# Patient Record
Sex: Male | Born: 1955 | Race: Black or African American | Hispanic: No | Marital: Single | Smoking: Never smoker
Health system: Southern US, Community
[De-identification: ages and names within clinical notes are randomized; demographics above are authoritative.]

## PROBLEM LIST (undated history)

## (undated) DIAGNOSIS — I255 Ischemic cardiomyopathy: Secondary | ICD-10-CM

## (undated) DIAGNOSIS — K635 Polyp of colon: Secondary | ICD-10-CM

## (undated) DIAGNOSIS — K621 Rectal polyp: Secondary | ICD-10-CM

## (undated) DIAGNOSIS — I10 Essential (primary) hypertension: Secondary | ICD-10-CM

## (undated) DIAGNOSIS — I2510 Atherosclerotic heart disease of native coronary artery without angina pectoris: Secondary | ICD-10-CM

## (undated) HISTORY — PX: OTHER SURGICAL HISTORY: SHX169

## (undated) HISTORY — DX: Polyp of colon: K63.5

## (undated) HISTORY — DX: Rectal polyp: K62.1

## (undated) HISTORY — DX: Essential (primary) hypertension: I10

---

## 1999-02-08 ENCOUNTER — Ambulatory Visit (HOSPITAL_COMMUNITY): Admission: RE | Admit: 1999-02-08 | Discharge: 1999-02-08 | Payer: Self-pay | Admitting: Family Medicine

## 2006-01-01 ENCOUNTER — Ambulatory Visit: Payer: Self-pay | Admitting: Cardiology

## 2006-01-03 ENCOUNTER — Ambulatory Visit: Payer: Self-pay

## 2006-09-29 ENCOUNTER — Emergency Department (HOSPITAL_COMMUNITY): Admission: EM | Admit: 2006-09-29 | Discharge: 2006-09-29 | Payer: Self-pay | Admitting: Emergency Medicine

## 2007-01-21 ENCOUNTER — Ambulatory Visit: Payer: Self-pay | Admitting: Internal Medicine

## 2007-01-21 LAB — CONVERTED CEMR LAB
ALT: 23 units/L (ref 0–40)
AST: 22 units/L (ref 0–37)
Alkaline Phosphatase: 51 units/L (ref 39–117)
Basophils Absolute: 0 10*3/uL (ref 0.0–0.1)
Basophils Relative: 0.7 % (ref 0.0–1.0)
Bilirubin, Direct: 0.1 mg/dL (ref 0.0–0.3)
Calcium: 9.2 mg/dL (ref 8.4–10.5)
Creatinine, Ser: 1.1 mg/dL (ref 0.4–1.5)
Eosinophils Absolute: 0 10*3/uL (ref 0.0–0.6)
Eosinophils Relative: 1 % (ref 0.0–5.0)
GFR calc Af Amer: 90 mL/min
Glucose, Bld: 87 mg/dL (ref 70–99)
HCT: 42.3 % (ref 39.0–52.0)
Hemoglobin: 14.5 g/dL (ref 13.0–17.0)
MCHC: 34.4 g/dL (ref 30.0–36.0)
Neutrophils Relative %: 45.6 % (ref 43.0–77.0)
Platelets: 200 10*3/uL (ref 150–400)
Pro B Natriuretic peptide (BNP): 29 pg/mL (ref 0.0–100.0)
RDW: 12.3 % (ref 11.5–14.6)
Total Bilirubin: 1 mg/dL (ref 0.3–1.2)
WBC: 4.5 10*3/uL (ref 4.5–10.5)

## 2007-01-24 ENCOUNTER — Ambulatory Visit (HOSPITAL_COMMUNITY): Admission: RE | Admit: 2007-01-24 | Discharge: 2007-01-24 | Payer: Self-pay | Admitting: Internal Medicine

## 2007-04-07 ENCOUNTER — Ambulatory Visit: Payer: Self-pay | Admitting: Internal Medicine

## 2009-08-18 ENCOUNTER — Ambulatory Visit: Payer: Self-pay | Admitting: Family Medicine

## 2009-09-15 LAB — HM COLONOSCOPY

## 2011-01-30 NOTE — Assessment & Plan Note (Signed)
[facility]                             PULMONARY OFFICE NOTE   NAME:Jorge Fisher, Jorge Fisher                       MRN:          [id number]  DATE:01/21/2007                            DOB:          [birth year]    CHIEF COMPLAINT:  Dyspnea.   HISTORY:  A 55 year old black male with abrupt onset in January of  dyspnea after walking in from cutting wood at home.  Since that time he  has been short of breath on a predictable basis, can only walk maybe 4-6  minutes before he gives out.  He has had slight cough associated with  the dyspnea and thinks he may be a little bit worse if he walks outside  versus inside.  He denies any exertional chest pain, orthopnea,  paroxysmal nocturnal dyspnea or leg swelling or significant variability  with weather changes or overt sinus or reflux symptoms.  He has already  been treated with a inhaler and Prednisone, which he said helped a  little bit.   PAST MEDICAL HISTORY:  Significant for hypertension, but not he is not  on ACE inhibitors or on any medications at present for this.   ALLERGIES:  None known.   SOCIAL HISTORY:  He never smoked.  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   FAMILY HISTORY:  Significant for the absence of HPI, asthma, review of  systems or any respiratory diseases, positive for heart disease in his  father.   REVIEW OF SYSTEMS:  Taken in detail and negative except for as outlined  above.   PHYSICAL EXAMINATION:  This is a stoic ambulatory black male with a bit  of a unusual affect and attitude.  He has stable vital signs.  HEENT:  Unremarkable, pharynx clear.  NECK:  Supple without cervical adenopathy tenderness, trachea is  midline, no thyromegaly.  LUNG FIELDS:  Perfectly clear bilaterally to auscultation and  percussion.  HEART:  Regular rhythm without murmur, gallop or rub.  ABDOMEN:  Soft, benign.  EXTREMITIES:  Warm without calf  tenderness, cyanosis, clubbing or edema.   Chest x-ray was reviewed from January 14 and indicated mild left basal  atelectasis.  CBC was normal, sed rate was 12, BNP was normal, TSH was  normal, bicarb level was 32.  Pulmonary function tests revealed a  classic expiratory truncation that was a variable and only in the effort  defend part of his flow volume.   IMPRESSION:  Unexplained dyspnea with exertion that improved somewhat  after Albuterol and prednisone, but not in a convincing or sustained  fashion and now is associated with truncation only in the expiratory  portion of the flow volume loop.  This is consistent with either vocal  cord dysfunction or a functional illness.  Note that we could not  reproduce dyspnea here in the office.   The major differential here, however is still occult asthma and I have  recommended that he undergo a Methacholine challenge testing and perhaps  a CP ST to complete the work up if the Methacholine  challenge test is  negative.  I see no evidence of thromboembolic disease, interstitial  lung disease or definite asthma by evaluation today.     Charlaine Dalton. Sherene Sires, MD, [facility]  Electronically Signed    MBW/MedQ  DD: 01/21/2007  DT: 01/22/2007  Job #: [id number]

## 2011-01-30 NOTE — Assessment & Plan Note (Signed)
[facility]                             PULMONARY OFFICE NOTE   NAME:Jorge Fisher, Jorge Fisher                       MRN:          [id number]  DATE:04/07/2007                            DOB:          Jul 13, 1956    HISTORY:  A 55 year old black male with abrupt onset of dyspnea in  January of 2008 with a normal workup to date except for classic  expiratory truncation of the expiratory loop that appeared consistent  with local cord dysfunction.  He underwent a negative methacholine  challenge test on May 9 (because he reported some benefit from taking  prednisone and albuterol) and returns today stating he is about the  same.  By that means he is not shortness of breath but says he has not  really been physically very active yet.  Note that all the remainder of  his studies including chest x-ray, BNP, sed rate, CBC, and chemistry  profile were all normal.   The patient denies any pleuritic or exertional chest pain, dyspnea with  activity (although note he has limited his activity considerably),  orthopnea, PND or leg swelling, fevers, chills, sweats or unintended  weight loss or overt reflux symptoms.   PHYSICAL EXAMINATION:  He is a somber but not overtly depressed  ambulatory white male in no acute distress.  He is afebrile, normal  vital signs.  HEENT:  Unremarkable, oropharynx clear.  LUNGS:  Fields perfectly clear bilaterally to auscultation and  percussion.  There is trace pseudo-wheeze.  Regular rhythm without murmur, gallop, rub.  ABDOMEN:  Soft, benign.  EXTREMITIES:  Warm without calf tenderness, cyanosis, clubbing, edema.   IMPRESSION:  Vocal cord dysfunction is the most likely diagnosis.  Whether it is related to reflux or not remains to be seen.  I did  recommend a diet that I reviewed with him in detail and also Prilosec  over the counter if the symptoms remain.  The other option is to proceed  with a CPST but before we do this I need him to  understand that we will  not be able to get enough information to help him unless he is able to  exercise at a level where he is short of breath but not out of breath 30  minutes daily and explained reconditioning to him in detail in writing.  He is to call to schedule a CPST if he has not noticed benefit of  regular exercise after 2 weeks.     Charlaine Dalton. Sherene Sires, MD, [facility]  Electronically Signed    MBW/MedQ  DD: 04/07/2007  DT: 04/08/2007  Job #: [id number]   cc:   Sharlot Gowda, M.D.

## 2011-07-16 ENCOUNTER — Encounter: Payer: Self-pay | Admitting: Family Medicine

## 2012-02-07 ENCOUNTER — Inpatient Hospital Stay (HOSPITAL_COMMUNITY)
Admission: EM | Admit: 2012-02-07 | Discharge: 2012-02-13 | DRG: 247 | Disposition: A | Discharge status: Home / Self Care | Payer: 59 | Attending: Cardiovascular Disease | Admitting: Cardiovascular Disease

## 2012-02-07 ENCOUNTER — Encounter (HOSPITAL_COMMUNITY): Payer: Self-pay | Admitting: *Deleted

## 2012-02-07 DIAGNOSIS — E876 Hypokalemia: Secondary | ICD-10-CM | POA: Diagnosis present

## 2012-02-07 DIAGNOSIS — I2589 Other forms of chronic ischemic heart disease: Secondary | ICD-10-CM | POA: Diagnosis present

## 2012-02-07 DIAGNOSIS — I213 ST elevation (STEMI) myocardial infarction of unspecified site: Secondary | ICD-10-CM | POA: Diagnosis present

## 2012-02-07 DIAGNOSIS — R079 Chest pain, unspecified: Secondary | ICD-10-CM

## 2012-02-07 DIAGNOSIS — I5021 Acute systolic (congestive) heart failure: Secondary | ICD-10-CM

## 2012-02-07 DIAGNOSIS — I2109 ST elevation (STEMI) myocardial infarction involving other coronary artery of anterior wall: Principal | ICD-10-CM | POA: Diagnosis present

## 2012-02-07 DIAGNOSIS — Z955 Presence of coronary angioplasty implant and graft: Secondary | ICD-10-CM

## 2012-02-07 DIAGNOSIS — I255 Ischemic cardiomyopathy: Secondary | ICD-10-CM | POA: Diagnosis present

## 2012-02-07 DIAGNOSIS — I2582 Chronic total occlusion of coronary artery: Secondary | ICD-10-CM | POA: Diagnosis present

## 2012-02-07 DIAGNOSIS — I10 Essential (primary) hypertension: Secondary | ICD-10-CM | POA: Diagnosis present

## 2012-02-07 DIAGNOSIS — K649 Unspecified hemorrhoids: Secondary | ICD-10-CM | POA: Diagnosis present

## 2012-02-07 DIAGNOSIS — I252 Old myocardial infarction: Secondary | ICD-10-CM | POA: Diagnosis present

## 2012-02-07 DIAGNOSIS — D128 Benign neoplasm of rectum: Secondary | ICD-10-CM | POA: Diagnosis present

## 2012-02-07 DIAGNOSIS — I2510 Atherosclerotic heart disease of native coronary artery without angina pectoris: Secondary | ICD-10-CM | POA: Diagnosis present

## 2012-02-07 HISTORY — DX: Atherosclerotic heart disease of native coronary artery without angina pectoris: I25.10

## 2012-02-07 HISTORY — DX: Ischemic cardiomyopathy: I25.5

## 2012-02-07 MED ORDER — HEPARIN SODIUM (PORCINE) 5000 UNIT/ML IJ SOLN
INTRAMUSCULAR | Status: AC
  Filled 2012-02-07: qty 1

## 2012-02-07 NOTE — ED Notes (Signed)
Pt started having pain around 9pm, 120/81 pressure by ems initally. 1 nitro given enroute with 4 baby asa.  Rates pain 8/10 at this time. Cardiologist at bedside on arrival

## 2012-02-08 ENCOUNTER — Ambulatory Visit (HOSPITAL_COMMUNITY): Admit: 2012-02-08 | Payer: Self-pay | Admitting: Cardiovascular Disease

## 2012-02-08 ENCOUNTER — Encounter (HOSPITAL_COMMUNITY): Payer: Self-pay | Admitting: Internal Medicine

## 2012-02-08 ENCOUNTER — Inpatient Hospital Stay (HOSPITAL_COMMUNITY): Payer: 59

## 2012-02-08 ENCOUNTER — Encounter (HOSPITAL_COMMUNITY)
Admission: EM | Disposition: A | Discharge status: Home / Self Care | Payer: Self-pay | Source: Home / Self Care | Attending: Cardiovascular Disease

## 2012-02-08 DIAGNOSIS — I252 Old myocardial infarction: Secondary | ICD-10-CM | POA: Diagnosis present

## 2012-02-08 DIAGNOSIS — I255 Ischemic cardiomyopathy: Secondary | ICD-10-CM | POA: Diagnosis present

## 2012-02-08 DIAGNOSIS — E876 Hypokalemia: Secondary | ICD-10-CM | POA: Diagnosis present

## 2012-02-08 DIAGNOSIS — I2510 Atherosclerotic heart disease of native coronary artery without angina pectoris: Secondary | ICD-10-CM

## 2012-02-08 DIAGNOSIS — I2109 ST elevation (STEMI) myocardial infarction involving other coronary artery of anterior wall: Secondary | ICD-10-CM

## 2012-02-08 DIAGNOSIS — I213 ST elevation (STEMI) myocardial infarction of unspecified site: Secondary | ICD-10-CM | POA: Diagnosis present

## 2012-02-08 HISTORY — PX: LEFT HEART CATHETERIZATION WITH CORONARY ANGIOGRAM: SHX5451

## 2012-02-08 HISTORY — PX: PERCUTANEOUS CORONARY STENT INTERVENTION (PCI-S): SHX5485

## 2012-02-08 LAB — HEMOGLOBIN A1C
Hgb A1c MFr Bld: 5.9 % — ABNORMAL HIGH (ref <5.7)
Mean Plasma Glucose: 123 mg/dL — ABNORMAL HIGH (ref <117)

## 2012-02-08 LAB — PROTIME-INR: INR: 0.94 (ref 0.00–1.49)

## 2012-02-08 LAB — BASIC METABOLIC PANEL
BUN: 10 mg/dL (ref 6–23)
CO2: 27 mEq/L (ref 19–32)
Chloride: 100 mEq/L (ref 96–112)
Chloride: 109 mEq/L (ref 96–112)
Creatinine, Ser: 0.97 mg/dL (ref 0.50–1.35)
Creatinine, Ser: 1.09 mg/dL (ref 0.50–1.35)
GFR calc Af Amer: 85 mL/min — ABNORMAL LOW (ref >90)
GFR calc Af Amer: >90 mL/min (ref >90)
GFR calc non Af Amer: 73 mL/min — ABNORMAL LOW (ref >90)
Glucose, Bld: 168 mg/dL — ABNORMAL HIGH (ref 70–99)
Glucose, Bld: 97 mg/dL (ref 70–99)
Potassium: 3.1 mEq/L — ABNORMAL LOW (ref 3.5–5.1)

## 2012-02-08 LAB — POCT I-STAT, CHEM 8
BUN: 20 mg/dL (ref 6–23)
Chloride: 103 mEq/L (ref 96–112)
Glucose, Bld: 169 mg/dL — ABNORMAL HIGH (ref 70–99)
Hemoglobin: 16 g/dL (ref 13.0–17.0)
Potassium: 3.3 mEq/L — ABNORMAL LOW (ref 3.5–5.1)
TCO2: 25 mmol/L (ref 0–100)

## 2012-02-08 LAB — CBC
HCT: 39.6 % (ref 39.0–52.0)
MCHC: 33.8 g/dL (ref 30.0–36.0)
MCV: 92.7 fL (ref 78.0–100.0)
MCV: 93.5 fL (ref 78.0–100.0)
RDW: 13.8 % (ref 11.5–15.5)
WBC: 4.6 10*3/uL (ref 4.0–10.5)
WBC: 8 10*3/uL (ref 4.0–10.5)

## 2012-02-08 LAB — CARDIAC PANEL(CRET KIN+CKTOT+MB+TROPI)
CK, MB: 8.7 ng/mL (ref 0.3–4.0)
CK, MB: 2.3 ng/mL (ref 0.3–4.0)
Total CK: 43 U/L (ref 7–232)
Total CK: 3255 U/L — ABNORMAL HIGH (ref 7–232)

## 2012-02-08 LAB — LIPID PANEL
HDL: 58 mg/dL (ref >39)
LDL Cholesterol: 48 mg/dL (ref 0–99)

## 2012-02-08 LAB — DIFFERENTIAL
Lymphocytes Relative: 21 % (ref 12–46)
Lymphs Abs: 1.7 10*3/uL (ref 0.7–4.0)
Monocytes Absolute: 0.3 10*3/uL (ref 0.1–1.0)
Neutro Abs: 6.1 10*3/uL (ref 1.7–7.7)

## 2012-02-08 LAB — POCT I-STAT TROPONIN I: Troponin i, poc: 0.17 ng/mL (ref 0.00–0.08)

## 2012-02-08 LAB — APTT: aPTT: 31 seconds (ref 24–37)

## 2012-02-08 LAB — GLUCOSE, CAPILLARY: Glucose-Capillary: 133 mg/dL — ABNORMAL HIGH (ref 70–99)

## 2012-02-08 LAB — POCT ACTIVATED CLOTTING TIME: Activated Clotting Time: 551 seconds

## 2012-02-08 SURGERY — LEFT HEART CATHETERIZATION WITH CORONARY ANGIOGRAM
Anesthesia: LOCAL

## 2012-02-08 MED ORDER — ACETAMINOPHEN 325 MG PO TABS
650.0000 mg | ORAL_TABLET | ORAL | Status: DC | PRN
  Filled 2012-02-08: qty 2

## 2012-02-08 MED ORDER — HEPARIN (PORCINE) IN NACL 2-0.9 UNIT/ML-% IJ SOLN
INTRAMUSCULAR | Status: AC
  Filled 2012-02-08: qty 2000

## 2012-02-08 MED ORDER — LIDOCAINE HCL (PF) 1 % IJ SOLN
INTRAMUSCULAR | Status: AC
  Filled 2012-02-08: qty 30

## 2012-02-08 MED ORDER — PRASUGREL HCL 10 MG PO TABS
10.0000 mg | ORAL_TABLET | Freq: Every day | ORAL | Status: DC
  Administered 2012-02-08 – 2012-02-13 (×6): 10 mg via ORAL
  Filled 2012-02-08 (×7): qty 1

## 2012-02-08 MED ORDER — ATORVASTATIN CALCIUM 80 MG PO TABS
80.0000 mg | ORAL_TABLET | Freq: Every day | ORAL | Status: DC
Start: 2012-02-08 — End: 2012-02-13
  Administered 2012-02-08 – 2012-02-12 (×6): 80 mg via ORAL
  Filled 2012-02-08 (×10): qty 1

## 2012-02-08 MED ORDER — METOPROLOL TARTRATE 1 MG/ML IV SOLN
2.5000 mg | Freq: Four times a day (QID) | INTRAVENOUS | Status: DC
  Administered 2012-02-08 – 2012-02-10 (×6): 2.5 mg via INTRAVENOUS
  Filled 2012-02-08 (×11): qty 5

## 2012-02-08 MED ORDER — HEPARIN (PORCINE) IN NACL 100-0.45 UNIT/ML-% IJ SOLN
1350.0000 [IU]/h | INTRAMUSCULAR | Status: DC
  Administered 2012-02-08: 1200 [IU]/h via INTRAVENOUS
  Administered 2012-02-09 (×2): 1400 [IU]/h via INTRAVENOUS
  Administered 2012-02-09: 1500 [IU]/h via INTRAVENOUS
  Administered 2012-02-10 – 2012-02-12 (×3): 1350 [IU]/h via INTRAVENOUS
  Filled 2012-02-08 (×8): qty 250

## 2012-02-08 MED ORDER — MORPHINE SULFATE 2 MG/ML IJ SOLN: 2.0000 mg | INTRAMUSCULAR | Status: DC | PRN

## 2012-02-08 MED ORDER — FENTANYL CITRATE 0.05 MG/ML IJ SOLN
INTRAMUSCULAR | Status: AC
  Filled 2012-02-08: qty 2

## 2012-02-08 MED ORDER — NITROGLYCERIN 0.2 MG/ML ON CALL CATH LAB
INTRAVENOUS | Status: AC
  Filled 2012-02-08: qty 1

## 2012-02-08 MED ORDER — BIVALIRUDIN 250 MG IV SOLR
INTRAVENOUS | Status: AC
  Filled 2012-02-08: qty 250

## 2012-02-08 MED ORDER — NITROGLYCERIN 0.4 MG SL SUBL
0.4000 mg | SUBLINGUAL_TABLET | SUBLINGUAL | Status: DC | PRN
  Filled 2012-02-08: qty 25

## 2012-02-08 MED ORDER — MIDAZOLAM HCL 2 MG/2ML IJ SOLN
INTRAMUSCULAR | Status: AC
  Filled 2012-02-08: qty 2

## 2012-02-08 MED ORDER — HEPARIN SODIUM (PORCINE) 5000 UNIT/ML IJ SOLN
INTRAMUSCULAR | Status: AC
  Administered 2012-02-07: 4000 [IU]
  Filled 2012-02-08: qty 1

## 2012-02-08 MED ORDER — PRASUGREL HCL 10 MG PO TABS
ORAL_TABLET | ORAL | Status: AC
  Filled 2012-02-08: qty 6

## 2012-02-08 MED ORDER — ASPIRIN 81 MG PO CHEW
81.0000 mg | CHEWABLE_TABLET | Freq: Every day | ORAL | Status: DC
  Administered 2012-02-08 – 2012-02-11 (×4): 81 mg via ORAL
  Filled 2012-02-08 (×4): qty 1

## 2012-02-08 MED ORDER — HEPARIN SODIUM (PORCINE) 5000 UNIT/ML IJ SOLN
5000.0000 [IU] | Freq: Three times a day (TID) | INTRAMUSCULAR | Status: DC
  Filled 2012-02-08 (×3): qty 1

## 2012-02-08 MED ORDER — SODIUM CHLORIDE 0.9 % IV SOLN
INTRAVENOUS | Status: DC
  Administered 2012-02-08: 20 mL/h via INTRAVENOUS
  Administered 2012-02-11: 06:00:00 via INTRAVENOUS

## 2012-02-08 MED ORDER — POTASSIUM CHLORIDE CRYS ER 20 MEQ PO TBCR
60.0000 meq | EXTENDED_RELEASE_TABLET | Freq: Once | ORAL | Status: AC
  Administered 2012-02-08: 60 meq via ORAL
  Filled 2012-02-08: qty 3

## 2012-02-08 MED ORDER — ASPIRIN EC 325 MG PO TBEC: 325.0000 mg | DELAYED_RELEASE_TABLET | Freq: Every day | ORAL | Status: DC

## 2012-02-08 MED ORDER — ONDANSETRON HCL 4 MG/2ML IJ SOLN: 4.0000 mg | Freq: Four times a day (QID) | INTRAMUSCULAR | Status: DC | PRN

## 2012-02-08 MED ORDER — SODIUM CHLORIDE 0.9 % IV SOLN
INTRAVENOUS | Status: AC
  Administered 2012-02-08: 03:00:00 via INTRAVENOUS

## 2012-02-08 NOTE — H&P (Signed)
Cardiology H&P  Primary Care Povider: No primary provider on file. Primary Cardiologist: none   HPI: Jorge Fisher is a 56 y.o.male with no prior medical history who presents to the ED via EMS with chest pain that began at 9pm tonight (3hours ago) and has been constant since then.  The pain is pressure like and nonradiating.  It is associated with nausea but no vomiting.  He also has mild SOB. His pain is currently 2/10 in intensity.  He has no prior history of heart disease or chest pain.  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  He does not know his lipid status.  He has never had stroke.  He has never had bleeding problems.  His next of kin is his mother, Jorge Fisher [phone] who he does not want called unless absolutely needed.   Past Medical History  Diagnosis Date  . Hypertension   . Hemorrhoids   . Colorectal polyps     History reviewed. No pertinent past surgical history.  Family History  Problem Relation Age of Onset  . Heart attack Brother 55  . Heart disease Father 73    Social History:  reports that he has never smoked. He does not have any smokeless tobacco history on file. He reports that he does not drink alcohol or use illicit drugs.  Allergies: No Known Allergies  Current Facility-Administered Medications  Medication Dose Route Frequency Provider Last Rate Last Dose  . fentaNYL (SUBLIMAZE) 0.05 MG/ML injection           . heparin 5000 UNIT/ML injection           . heparin 5000 UNIT/ML injection        4,000 Units at 02/07/12 2358  . midazolam (VERSED) 2 MG/2ML injection            Current Outpatient Prescriptions  Medication Sig Dispense Refill  . Ascorbic Acid (VITAMIN C) 1000 MG tablet Take 1,000 mg by mouth daily.        . fish oil-omega-3 fatty acids 1000 MG capsule Take 2 g by mouth daily.        . Multiple Vitamins-Minerals (MULTIVITAMIN WITH MINERALS) tablet Take 1 tablet by mouth daily.          ROS: A  full review of systems is obtained and is negative except as noted in the HPI.  Physical Exam: Blood pressure 103/64, pulse 77, temperature 98.1 F (36.7 C), resp. rate 17, SpO2 99.00%.  GENERAL: no acute distress.  EYES: Extra ocular movements are intact. There is no lid lag. Sclera is anicteric.  ENT: Oropharynx is clear. Dentition is within normal limits.  NECK: Supple. The thyroid is not enlarged.  LYMPH: There are no masses or lymphadenopathy present.  HEART: Regular rate and rhythm with no m/g/r.  Normal S1/S2. Minimal JVD LUNGS: clear, no crackles bilaterally ABDOMEN: Soft, non-tender, and non-distended with normoactive bowel sounds. There is no hepatosplenomegaly.  EXTREMITIES: No clubbing, cyanosis, or edema.  PULSES: Femoral pulses were +2 and equal bilaterally. DP/PT pulses were +2 and equal bilaterally.  SKIN: Warm, dry, and intact.  NEUROLOGIC: The patient was oriented to person, place, and time. No overt neurologic deficits were detected.  PSYCH: Normal judgment and insight, mood is appropriate.   Results: Results for orders placed during the hospital encounter of 02/07/12 (from the past 24 hour(s))  POCT I-STAT TROPONIN I     Status: Abnormal   Collection  Time   02/08/12 12:04 AM      Component Value Range   Troponin i, poc 0.17 (*) 0.00 - 0.08 (ng/mL)   Comment 3           POCT I-STAT, CHEM 8     Status: Abnormal   Collection Time   02/08/12 12:08 AM      Component Value Range   Sodium 143  135 - 145 (mEq/L)   Potassium 3.3 (*) 3.5 - 5.1 (mEq/L)   Chloride 103  96 - 112 (mEq/L)   BUN 20  6 - 23 (mg/dL)   Creatinine, Ser 1.61  0.50 - 1.35 (mg/dL)   Glucose, Bld 096 (*) 70 - 99 (mg/dL)   Calcium, Ion 0.45  4.09 - 1.32 (mmol/L)   TCO2 25  0 - 100 (mmol/L)   Hemoglobin 16.0  13.0 - 17.0 (g/dL)   HCT 81.1  91.4 - 78.2 (%)    EKG: NSR with anterior STE as well as STE in aVR with reciprocal changes in inferior and lateral leads, concerning for 3vs or left main dz      CXR: pending  Assessment/Plan: 56 yo AAM with FH of CAD and HTN here with anterior STEMI.  He was taken emergently to the cath lab and found to have proximal circumflex dz,100%proximal LAD that had been giving collaterals to a chronically occluded RCA.Marland Kitchen He underwent stenting with BMS x 2 to the LAD.   1. STEMI: currently hemodynamically stable. - ASA 325 and prasugrel 10 daily (loaded with 60mg  in cath lab) - hold metoprolol for now for relative bradycardia - initiate acei later as tolerated - lipitor 80mg , check fasting lipids - plans will be made to stage the circumflex lesion during this hospitalilzation  2. HTN: current BP 105/70 - initiate metoprolol and acei during hospitalization   Jorge Fisher 02/08/2012, 12:16 AM

## 2012-02-08 NOTE — Care Management Note (Addendum)
    Page 1 of 1   02/08/2012     10:10:50 AM   CARE MANAGEMENT NOTE 02/08/2012  Patient:  SELMA, RODELO   Account Number:  [id number]  Date Initiated:  02/08/2012  Documentation initiated by:  Junius Creamer  Subjective/Objective Assessment:   adm w mi     Action/Plan:   lives w fam, no pcp listed   Anticipated DC Date:  02/10/2012   Anticipated DC Plan:  HOME/SELF CARE      DC Planning Services  CM consult      Choice offered to / List presented to:             Status of service:   Medicare Important Message given?   (If response is "NO", the following Medicare IM given date fields will be blank) Date Medicare IM given:   Date Additional Medicare IM given:    Discharge Disposition:    Per UR Regulation:  Reviewed for med. necessity/level of care/duration of stay  If discussed at Long Length of Stay Meetings, dates discussed:    Comments:  5/24 10a debbie Mercia Dowe rn,bsn [phone] saw that pt on effient. will print out pt assist forms and give pt card for 30days free of effient. ***PT STATES HAS [employer]-WILL GET CM SEC TO CK ON COPAY AS SOON AS PLACED IN COMPUTER. WILL STILL GIVE PT EFFIENT CARD FOR 30DAYS FREE****

## 2012-02-08 NOTE — Progress Notes (Signed)
Jorge Fisher Internal [facility] Resident Note  Subjective:  Presented last night about midnight as a STEMI.  Cath showed 100% occlusion of the LAD, 99% proximal stenosis of the RCA and 99% proximal stenosis of the Circumflex as well.  He underwent stenting x2 with DES to the LAD.  States that his chest pain has almost completely resolved but he continues to have "flashes" of chest pain in the same area of the lower left chest that he had last night.  Denies SOB, diaphoresis, or abdominal pain.  Didn't sleep much last night.   Objective:  Vital Signs in the last 24 hours: Filed Vitals:   02/08/12 0430 02/08/12 0500 02/08/12 0600 02/08/12 0700  BP: 108/73 108/64 102/67 96/53  Pulse: 85 81 84 80  Temp:      Resp: 13 19 17 16   Height:      Weight:      SpO2: 100% 100% 99% 99%   Intake/Output from previous day: 05/23 0701 - 05/24 0700 In: 204.2 [P.O.:60; I.V.:144.2] Out: 200 [Urine:200]  24-hour weight change: Weight change:   Weight trends: Filed Weights   02/08/12 0145  Weight: 194 lb 10.7 oz (88.3 kg)   Physical Exam: Vitals reviewed. General: resting in bed, NAD HEENT: PERRL, EOMI, no scleral icterus Cardiac: RRR, no rubs, murmurs or gallops Pulm: clear to auscultation bilaterally, no wheezes, rales, or rhonchi Abd: soft, nontender, nondistended, BS present Ext: warm and well perfused, no pedal edema Neuro: alert and oriented X3, cranial nerves II-XII grossly intact, strength and sensation to light touch equal in bilateral upper and lower extremities  Lab Results:  Basename 02/08/12 0008 02/08/12 0002  WBC -- 8.0  HGB 16.0 14.6  PLT -- 193    Basename 02/08/12 0008 02/08/12 0002  NA 143 139  K 3.3* 3.1*  CL 103 100  CO2 -- 24  GLUCOSE 169* 168*  BUN 20 19  CREATININE 1.20 1.09    Basename 02/08/12 0206  TROPONINI <0.30   Cardiac Studies (02/08/12): Hemodynamic Findings:  Central aortic pressure: 94/64  Left ventricular pressure: 90/18/28   Angiographic  Findings:  Left main: Ostial 40% stenosis.   Left Anterior Descending Artery: 100% proximal occlusion. After the vessel was opened, he was found to have diffuse disease from the proximal vessel down through the mid vessel. The distal vessel has mild plaque disease. The diagonal is moderate sized and has diffuse 80% stenosis throughout the proximal segment.   Circumflex Artery: Moderate sized vessel with proximal 99% stenosis. The first OM is moderate sized and has 70% proximal stenosis. This vessel has moderate 50% stenosis distally. The AV groove Circumflex is very small.   Right Coronary Artery: Large, dominant vessel. 99% proximal stenosis. The mid vessel has subtotal occlusion. This appears chronic. The PDA and PLA fill from left to right and right to right collaterals.   Left Ventricular Angiogram: LVEF 25-30% with severe hypokinesis of the anterior wall, apex and inferoapical wall.   Distal aorta: No evidence of aneurysm or stenosis.   Impression:  1. Acute anterior STEMI with occlusion of the proximal LAD  2. 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 The Circumflex has a severe proximal stenosis.  3. Successful PTCA/DES x 2 proximal and mid LAD  4. Severe LV systolic dysfunction  Tele: Reviewed.  Frequent PVCs on the monitor.   EKG:  NSR with PVCs, mild ST elevation in V2 and T wave flattening in V3  and V4, Improved from pre intervention  Scheduled Meds:   . aspirin  81 mg Oral Daily  . atorvastatin  80 mg Oral q1800  . bivalirudin      . fentaNYL      . heparin      . heparin      . heparin      . lidocaine      . midazolam      . nitroGLYCERIN      . potassium chloride  60 mEq Oral Once  . prasugrel      . prasugrel  10 mg Oral Daily  . DISCONTD: aspirin EC  325 mg Oral Daily  . DISCONTD: heparin  5,000 Units Subcutaneous Q8H   Continuous Infusions:   . sodium chloride 50 mL/hr at 02/08/12 0307   PRN  Meds:.acetaminophen, nitroGLYCERIN, ondansetron (ZOFRAN) IV  Imaging: No results found. Assessment/Plan:  56 yo AAM with FH of CAD and HTN here with anterior STEMI. He was taken emergently to the cath lab and found to have proximal circumflex dz,100% proximal LAD that had been giving collaterals to a chronically occluded RCA. He underwent stenting with BMS x 2 to the LAD.   1. STEMI: currently hemodynamically stable. EKG has some mild continued changes but much improved from pre-cath EKG.  He is having continued flashes of chest pain but nothing severe per his rating.   - ASA 81 and prasugrel 10 daily - Add beta blocker as BP recovers - initiate acei later as BP recovers - lipitor 80mg , check fasting lipids  - A1C to check for diabetes - plans will be made to stage the circumflex lesion during this hospitalilzation   2. HTN: BP currently a little soft so holding initiation of ACE-I and beta blocker for now.    3.  Acute systolic heart failure:  EF during cath was 25-30%.  Will likely need a repeat Echo in a few days to reassess LV function.   Length of Stay: 1 days  Patient history and plan of care reviewed with attending, Dr. Elijio Miles, M.D. 02/08/2012, 7:20 AM  Patient seen, examined. Available data reviewed. Agree with findings, assessment, and plan as outlined by Dr Jorge Branch. I have independently examined the patient and have reviewed his cath films. He has severe residual multivessel disease following PCI of the LAD. Will resume heparin this am, start low-dose IV metoprolol today and transition to PO coreg tomorrow. Will review films with interventional colleagues to determine best revascularization strategy. Keep in CCU today.  Jorge Fisher, M.D. 02/08/2012 10:01 AM

## 2012-02-08 NOTE — Progress Notes (Signed)
ANTICOAGULATION CONSULT NOTE - Follow Up Consult  Pharmacy Consult for heparin Indication: severe stenosis of circumflex  Labs:  Basename 02/08/12 2156 02/08/12 1615 02/08/12 1217 02/08/12 0755 02/08/12 0206 02/08/12 0008 02/08/12 0002  HGB -- -- -- 13.0 -- 16.0 --  HCT -- -- -- 39.6 -- 47.0 43.2  PLT -- -- -- 122* -- -- 193  APTT -- -- -- -- -- -- 31  LABPROT -- -- -- -- -- -- 12.8  INR -- -- -- -- -- -- 0.94  HEPARINUNFRC <0.10* 0.36 -- -- -- -- --  CREATININE -- -- -- 0.97 -- 1.20 1.09  CKTOTAL -- -- 3255* 43 207 -- --  CKMB -- -- 342.9* 2.3 8.7* -- --  TROPONINI -- -- >25.00* <0.30 <0.30 -- --    Assessment: 56yo male now undetectable on heparin after one therapeutic level without any interruptions or problems with infusion.  Goal of Therapy:  Heparin level 0.3-0.7 units/ml   Plan:  Will increase heparin gtt by 4 units/kg/hr to 1500 units/hr and check level with am labs.  Colleen Can PharmD BCPS 02/08/2012,11:07 PM

## 2012-02-08 NOTE — Progress Notes (Signed)
ANTICOAGULATION CONSULT NOTE - Follow Up Consult  Pharmacy Consult for heparin Indication: severe stenosis of cirumflex  Allergies  Allergen Reactions  . Peanuts (Peanut Oil) Swelling    cashews    Patient Measurements: Height: 5\' 10"  (177.8 cm) Weight: 194 lb 0.1 oz (88 kg) IBW/kg (Calculated) : 73  Heparin Dosing Weight: 88   Vital Signs: Temp: 98.9 F (37.2 C) (05/24 1600) Temp src: Oral (05/24 1600) BP: 99/64 mmHg (05/24 1600) Pulse Rate: 73  (05/24 1600)  Labs:  Basename 02/08/12 1615 02/08/12 1217 02/08/12 0755 02/08/12 0206 02/08/12 0008 02/08/12 0002  HGB -- -- 13.0 -- 16.0 --  HCT -- -- 39.6 -- 47.0 43.2  PLT -- -- 122* -- -- 193  APTT -- -- -- -- -- 31  LABPROT -- -- -- -- -- 12.8  INR -- -- -- -- -- 0.94  HEPARINUNFRC 0.36 -- -- -- -- --  CREATININE -- -- 0.97 -- 1.20 1.09  CKTOTAL -- 3255* 43 207 -- --  CKMB -- 342.9* 2.3 8.7* -- --  TROPONINI -- >25.00* <0.30 <0.30 -- --    Estimated Creatinine Clearance: 92.8 ml/min (by C-G formula based on Cr of 0.97).   Medications:  Infusions:    . sodium chloride 50 mL/hr at 02/08/12 0307  . sodium chloride 20 mL/hr (02/08/12 0940)  . heparin 1,200 Units/hr (02/08/12 1107)    Assessment: 56 yo M admitted with STEMI and subsequently taken to cath early 5/24 am with stents placed: PTCA/DES x 2 proximal and mid LAD. Also found to have severe stenosis in the circumflex. He will have a staged PCI of this in the next several days. On heparin while awaiting staged PCI. Platelets 193-->122 since admission - will monitor closely.  Currently, heparin level at goal with heparin at 1200 units/hr, no complications noted.  Goal of Therapy:  Heparin level 0.3-0.7 units/ml Monitor platelets by anticoagulation protocol: Yes   Plan:  -Continue same heparin -f/u 6h level to confirm   Red Mandt L. Illene Bolus, PharmD, BCPS Clinical Pharmacist Pager: [phone] 02/08/2012 4:55 PM

## 2012-02-08 NOTE — ED Provider Notes (Signed)
History     CSN: [id number]  Arrival date & time 02/07/12  2350   First MD Initiated Contact with Patient 02/07/12 2355      Chief Complaint  Patient presents with  . Code STEMI    (Consider location/radiation/quality/duration/timing/severity/associated sxs/prior treatment) The history is provided by the patient and the EMS personnel. The history is limited by the condition of the patient.  2 y male c/o left sided cp for several hours. Started at rest.  + nausea, sweating, sob.  No radiation. No smokiing.  + father and brother hx of ami.  Level 5 caveat for urgent need for intervention due to STEMI  Past Medical History  Diagnosis Date  . Hypertension   . Hemorrhoids   . Colorectal polyps     History reviewed. No pertinent past surgical history.  Family History  Problem Relation Age of Onset  . Heart attack Brother 55  . Heart disease Father 30    History  Substance Use Topics  . Smoking status: Never Smoker   . Smokeless tobacco: Not on file  . Alcohol Use: No      Review of Systems  Constitutional: Positive for diaphoresis.  Respiratory: Positive for chest tightness and shortness of breath.   Cardiovascular: Positive for chest pain. Negative for palpitations.  Gastrointestinal: Positive for nausea. Negative for vomiting.  Neurological: Negative for headaches.  Hematological: Does not bruise/bleed easily.  Psychiatric/Behavioral: Negative for confusion.  All other systems reviewed and are negative.    Allergies  Peanuts  Home Medications  No current outpatient prescriptions on file.  BP 108/73  Pulse 85  Temp 98.1 F (36.7 C)  Resp 13  Ht 5\' 10"  (1.778 m)  Wt 194 lb 10.7 oz (88.3 kg)  BMI 27.93 kg/m2  SpO2 100%  Physical Exam  Vitals reviewed. Constitutional: He is oriented to person, place, and time. He appears well-developed and well-nourished. No distress.  HENT:  Head: Normocephalic and atraumatic.  Eyes: Conjunctivae are normal.    Neck: Normal range of motion. Neck supple.  Cardiovascular: Normal rate.   No murmur heard. Pulmonary/Chest: Effort normal and breath sounds normal. He has no wheezes. He has no rales.  Abdominal: Soft. He exhibits no distension. There is no tenderness.  Musculoskeletal: Normal range of motion. He exhibits no edema and no tenderness.  Neurological: He is alert and oriented to person, place, and time.  Skin: Skin is warm and dry.  Psychiatric: He has a normal mood and affect. Thought content normal.    ED Course  Procedures (including critical care time) stemi Cards here to take him to cath lab  Labs Reviewed  BASIC METABOLIC PANEL - Abnormal; Notable for the following:    Potassium 3.1 (*)    Glucose, Bld 168 (*)    GFR calc non Af Amer 73 (*)    GFR calc Af Amer 85 (*)    All other components within normal limits  POCT I-STAT, CHEM 8 - Abnormal; Notable for the following:    Potassium 3.3 (*)    Glucose, Bld 169 (*)    All other components within normal limits  POCT I-STAT TROPONIN I - Abnormal; Notable for the following:    Troponin i, poc 0.17 (*)    All other components within normal limits  CARDIAC PANEL(CRET KIN+CKTOT+MB+TROPI) - Abnormal; Notable for the following:    CK, MB 8.7 (*)    Relative Index 4.2 (*)    All other components within normal limits  CBC  DIFFERENTIAL  PROTIME-INR  APTT  MRSA PCR SCREENING  CARDIAC PANEL(CRET KIN+CKTOT+MB+TROPI)  CARDIAC PANEL(CRET KIN+CKTOT+MB+TROPI)  CBC  BASIC METABOLIC PANEL  LIPID PANEL   No results found.   No diagnosis found.    MDM  stemi        Cheri Guppy, MD 02/08/12 [id number]

## 2012-02-08 NOTE — ED Notes (Signed)
Cardiologist/ EDP at bedside on arrival. Code Stemi activated prior to arrival with EMS.  Pt transported to Cath Lab on the monitor with RN and cardiologist.  Report given at bedside to cath lab team.

## 2012-02-08 NOTE — CV Procedure (Signed)
Cardiac Catheterization Operative Report  Jorge Fisher [id number] 5/24/20131:11 AM No primary provider on file.  Procedure Performed:  1. Left Heart Catheterization 2. Selective Coronary Angiography 3. Left ventricular angiogram 4. Distal aortogram 5. PTCA/DES x 2 proximal and mid LAD  Operator: Verne Carrow, MD  Indication:   Acute anterior STEMI                           Procedure Details: Emergency consent was obtained.  The patient was brought to the cath lab emergently from the ED. 4000 units IV heparin and ASA 325 mg po x 1 given in ED.  The patient was further sedated with Versed and Fentanyl. The right groin was prepped and draped in the usual manner. Using the modified Seldinger access technique, a 6 French sheath was placed in the right femoral artery. A JR4 catheter was used to engage the RCA. A XB LAD 3.5 guiding catheter was used to engage the left main. The patient was found to have acutely occluded proximal LAD. He was given a bolus of Angiomax and a drip was started. When the ACT was greater than 200, I passed a Cougar IC wire down the occluded vessel and out into a diagonal branch. I then used a 2.5 x 15 mm balloon x 2 to open the proximal LAD. There was not flow into the mid and distal vessel. I then placed a second Cougar IC wire down the LAD. I then inflated a 2.5 x 15 mm balloon x 3 in the mid and proximal LAD. I made the decision at this time to treat him percutaneously. He was given 60 mg Effient po x 1. I then deployed a 2.5 x 24 Promus Element DES in the mid LAD. I then placed a 2.75 x 32 mm Promus Element DES in the proximal vessel, overlapping with the first stent. The distal stent was post-dilated with a 2.75 x 20 mm [place] balloon x 2. I then used a 3.25 x 20 mm [place] balloon x 2 to post-dilate the proximal stent. There was a good result. His ST elevation resolved. His chest pain resolved. The wires and guiding catheter were removed. A pigtail catheter was used  to perform a left ventricular angiogram and a distal aortogram. He was hemodynamically stable. I elected not to place a balloon pump. There were no immediate complications. The patient was taken to the CCU in stable condition.   Hemodynamic Findings: Central aortic pressure: 94/64 Left ventricular pressure: 90/18/28  Angiographic Findings:  Left main: Ostial 40% stenosis.   Left Anterior Descending Artery: 100% proximal occlusion. After the vessel was opened, he was found to have diffuse disease from the proximal vessel down through the mid vessel. The distal vessel has mild plaque disease. The diagonal is moderate sized and has diffuse 80% stenosis throughout the proximal segment.   Circumflex Artery: Moderate sized vessel with proximal 99% stenosis. The first OM is moderate sized and has 70% proximal stenosis. This vessel has moderate 50% stenosis distally. The AV groove Circumflex is very small.   Right Coronary Artery: Large, dominant vessel. 99% proximal stenosis. The mid vessel has subtotal occlusion. This appears chronic. The PDA and PLA fill from left to right and right to right collaterals.   Left Ventricular Angiogram: LVEF 25-30% with severe hypokinesis of the anterior wall, apex and inferoapical wall.   Distal aorta: No evidence of aneurysm or stenosis.   Impression: 1. Acute anterior STEMI  with occlusion of the proximal LAD 2. 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 The Circumflex has a severe proximal stenosis.  3. Successful PTCA/DES x 2 proximal and mid LAD 4. Severe LV systolic dysfunction  Recommendations: Will continue ASA and Effient. He will be admitted to the CCU for the next 24-48 hours. He may require additional days in the ICU setting given the severity of his LV dysfunction and CAD. Will add beta blocker as BP tolerates. Will add Ace-inh over next 24-48 hours as BP tolerates. Will add statin tonight.  Check echo in am. Will stage PCI of Circumflex in several days.        Complications:  None. The patient tolerated the procedure well.

## 2012-02-08 NOTE — Consult Note (Signed)
ANTICOAGULATION CONSULT NOTE - Initial Consult  Pharmacy Consult for Heparin Indication: severe stenosis of circumflex  Allergies  Allergen Reactions  . Peanuts (Peanut Oil) Swelling    cashews   Patient Measurements: Height: 5\' 10"  (177.8 cm) Weight: 194 lb 0.1 oz (88 kg) IBW/kg (Calculated) : 73   Vital Signs: Temp: 97.8 F (36.6 C) (05/24 0700) Temp src: Oral (05/24 0700) BP: 100/47 mmHg (05/24 0900) Pulse Rate: 74  (05/24 0900)  Labs:  Basename 02/08/12 0755 02/08/12 0206 02/08/12 0008 02/08/12 0002  HGB 13.0 -- 16.0 --  HCT 39.6 -- 47.0 43.2  PLT 122* -- -- 193  APTT -- -- -- 31  LABPROT -- -- -- 12.8  INR -- -- -- 0.94  HEPARINUNFRC -- -- -- --  CREATININE 0.97 -- 1.20 1.09  CKTOTAL 43 207 -- --  CKMB 2.3 8.7* -- --  TROPONINI <0.30 <0.30 -- --    Estimated Creatinine Clearance: 92.8 ml/min (by C-G formula based on Cr of 0.97).   Medical History: Past Medical History  Diagnosis Date  . Hypertension   . Hemorrhoids   . Colorectal polyps    Assessment: 56yom who presented with a STEMI was taken to cath this morning and had PTCA/DES x 2 proximal and mid LAD. Also found to have severe stenosis in the circumflex.  He will have a staged PCI of this in the next several days. Will begin heparin while awaiting staged PCI.  Platelets 193-->122 since admission - will monitor closely.  Goal of Therapy:  Heparin level 0.3-0.7 units/ml Monitor platelets by anticoagulation protocol: Yes   Plan:  1) Begin heparin gtt at 1200 units/hr with NO bolus 2) 6h heparin level 3) Daily heparin level and CBC  Fredrik Rigger 02/08/2012,10:06 AM

## 2012-02-08 NOTE — Progress Notes (Signed)
This visit was in response to a code stemi.  Pt did not have any family that he wished to contact at this time.  Please page if I am needed or requested. Meosha Castanon  [phone]  oncall pager

## 2012-02-09 DIAGNOSIS — I219 Acute myocardial infarction, unspecified: Secondary | ICD-10-CM

## 2012-02-09 LAB — CBC
Hemoglobin: 13.5 g/dL (ref 13.0–17.0)
MCH: 31.3 pg (ref 26.0–34.0)
MCV: 93 fL (ref 78.0–100.0)
RBC: 4.31 MIL/uL (ref 4.22–5.81)
WBC: 9 10*3/uL (ref 4.0–10.5)

## 2012-02-09 LAB — HEPARIN LEVEL (UNFRACTIONATED): Heparin Unfractionated: 0.79 IU/mL — ABNORMAL HIGH (ref 0.30–0.70)

## 2012-02-09 MED ORDER — WHITE PETROLATUM GEL
Status: AC
  Administered 2012-02-09: 15:00:00
  Filled 2012-02-09: qty 5

## 2012-02-09 MED ORDER — CAPTOPRIL 6.25 MG HALF TABLET
6.2500 mg | ORAL_TABLET | Freq: Three times a day (TID) | ORAL | Status: DC
  Administered 2012-02-09 – 2012-02-10 (×4): 6.25 mg via ORAL
  Filled 2012-02-09 (×6): qty 1

## 2012-02-09 NOTE — Progress Notes (Signed)
CARDIAC REHAB PHASE I   PRE:  Rate/Rhythm: 75 SR    BP: sitting 106/63    SaO2:   MODE:  Ambulation: 350 ft   POST:  Rate/Rhythm: 80    BP: sitting 103/56     SaO2:   Tolerated well, denied CP/SOB. Steady. Very thankful to be OOB and moving. To recliner after walk. Gave an MI book. Will f/u Tues. Gave pt permission to walk around unit independently slowly. [id number]  Harriet Masson CES, ACSM

## 2012-02-09 NOTE — Plan of Care (Signed)
Problem: Food- and Nutrition-Related Knowledge Deficit (NB-1.1) Goal: Nutrition education Formal process to instruct or train a patient/client in a skill or to impart knowledge to help patients/clients voluntarily manage or modify food choices and eating behavior to maintain or improve health.  Outcome: Completed/Met Date Met:  02/09/12 RD consulted for diet education.  Pt with elevated HgbA1c of 5.9. Current lipid panel WNL. Reviewed dietary recall with pt. Noted that pt consumes 4 small meals throughout the day. Drinks mostly water and sweet tea. Avoids red meats. Current weight is 194 lb. Pt reports he would like to get down to 180 lb. Encouraged weight management for better blood sugar control. Discussed HgbA1c test and results. Pt appreciative of information. Discussed importance of making necessary dietary changes now to prevent progression of diabetes. Pt verbalized understanding. Reviewed carbohydrate-containing foods. Encouraged fiber. Provided General, Healthful eating handout.  Body mass index is 27.84 kg/(m^2). Pt is overweight.  Re consult RD for any additional nutrition concerns.  Jorge Fisher Pager# [phone]

## 2012-02-09 NOTE — Progress Notes (Signed)
ANTICOAGULATION CONSULT NOTE - Follow Up Consult  Pharmacy Consult for heparin Indication: severe circumflex stenosis awaiting stage intervention Tuesday 5/28  Allergies  Allergen Reactions  . Peanuts (Peanut Oil) Swelling    cashews    Patient Measurements: Height: 5\' 10"  (177.8 cm) Weight: 194 lb 0.1 oz (88 kg) IBW/kg (Calculated) : 73   Vital Signs: Temp: 97.9 F (36.6 C) (05/25 1200) Temp src: Oral (05/25 1200) BP: 103/56 mmHg (05/25 1226) Pulse Rate: 69  (05/25 0600)  Labs:  Alvira Philips 02/09/12 0625 02/08/12 2156 02/08/12 1615 02/08/12 1217 02/08/12 0755 02/08/12 0206 02/08/12 0008 02/08/12 0002  HGB 13.5 -- -- -- 13.0 -- -- --  HCT 40.1 -- -- -- 39.6 -- 47.0 --  PLT 171 -- -- -- 122* -- -- 193  APTT -- -- -- -- -- -- -- 31  LABPROT -- -- -- -- -- -- -- 12.8  INR -- -- -- -- -- -- -- 0.94  HEPARINUNFRC 0.79* <0.10* 0.36 -- -- -- -- --  CREATININE -- -- -- -- 0.97 -- 1.20 1.09  CKTOTAL -- -- -- 3255* 43 207 -- --  CKMB -- -- -- 342.9* 2.3 8.7* -- --  TROPONINI -- -- -- >25.00* <0.30 <0.30 -- --    Estimated Creatinine Clearance: 92.8 ml/min (by C-G formula based on Cr of 0.97).   Medications:  Scheduled:    . aspirin  81 mg Oral Daily  . atorvastatin  80 mg Oral q1800  . captopril  6.25 mg Oral TID  . metoprolol  2.5 mg Intravenous Q6H  . prasugrel  10 mg Oral Daily    Assessment: Mr. Beasley is a 56 year old man with limited medical history admitted with chest pain and found to have acute anterior STEMI now s/p DES x2 awaiting stage therapy 5/28. Heparin level 0.79 this AM, slightly supratherapeutic. Plt/Hgb look great. Goal of Therapy:  Heparin level 0.3-0.7 units/ml Monitor platelets by anticoagulation protocol: Yes   Plan:  1. Decrease heparin to 1400 units/hour 2. F/U HL 5/26  Huntley Knoop, [place] R, PharmD 02/09/2012,1:22 PM

## 2012-02-09 NOTE — Progress Notes (Signed)
Jorge Bottoms, MD, [facility] ABIM Board Certified in Adult Cardiovascular Medicine,Internal Medicine and Critical Care Medicine      Subjective:    The patient is status post anterior wall myocardial infarction treated with intervention and stent placement to the LAD.  He has significant residual coronary artery disease and is scheduled for staged intervention on Tuesday by Dr. Excell Seltzer. Despite his ejection fraction of 25-30% patient is hemodynamically stable although his blood pressure is relatively low.  He denies however any dizziness or weakness.  He really reports no complaints like chest pain or shortness of breath. Complaint the patient has had his Unhappy to be in the hospital .  I noted that the patient still has defibrillator patches on his chest while examining him.The patient is not wearing any oxygen anymore and his saturation is stable.    Objective:   Weight Range:  Vital Signs:   Temp:  [98.3 F (36.8 C)-99.1 F (37.3 C)] 98.3 F (36.8 C) (05/25 0700) Pulse Rate:  [68-76] 69  (05/25 0600) Resp:  [14-22] 16  (05/25 0700) BP: (80-103)/(44-68) 97/60 mmHg (05/25 0700) SpO2:  [95 %-99 %] 98 % (05/25 0600) Last BM Date: 02/07/12  Weight change: Filed Weights   02/08/12 0145 02/08/12 0800  Weight: 194 lb 10.7 oz (88.3 kg) 194 lb 0.1 oz (88 kg)    Intake/Output:   Intake/Output Summary (Last 24 hours) at 02/09/12 0940 Last data filed at 02/09/12 0700  Gross per 24 hour  Intake   1379 ml  Output    850 ml  Net    529 ml     Physical Exam: General: Pleasant African American male in no distress Neck: Normal carotid upstroke and no carotid bruits.  No obvious thyromegaly.  JVP is 6-7 cm Lungs generally clear but decreased breath sounds at the bases Heart regular rate and rhythm with normal S1 and S2 I do not hear a definite S3.  I also hear no pathological murmurs. Extremities: no cyanosis, clubbing, rash, edema Neuro: alert & orientedx3, cranial nerves grossly  intact. moves all 4 extremities w/o difficulty. Affect pleasant  Telemetry: Normal sinus rhythm Labs: Basic Metabolic Panel:  Lab 02/08/12 1610 02/08/12 0008 02/08/12 0002  NA 142 143 139  K 4.5 3.3* 3.1*  CL 109 103 100  CO2 27 -- 24  GLUCOSE 97 169* 168*  BUN 10 20 19   CREATININE 0.97 1.20 1.09  CALCIUM 8.8 -- 9.5  MG -- -- --  PHOS -- -- --     CBC:  Lab 02/09/12 0625 02/08/12 0755 02/08/12 0008 02/08/12 0002  WBC 9.0 4.6 -- 8.0  NEUTROABS -- -- -- 6.1  HGB 13.5 13.0 16.0 14.6  HCT 40.1 39.6 47.0 43.2  MCV 93.0 92.7 -- 93.5  PLT 171 122* -- 193    Cardiac Enzymes:  Lab 02/08/12 1217 02/08/12 0755 02/08/12 0206  CKTOTAL 3255* 43 207  CKMB 342.9* 2.3 8.7*  CKMBINDEX -- -- --  TROPONINI >25.00* <0.30 <0.30     BNP: BNP (last 3 results) No results found for this basename: PROBNP:3 in the last 8760 hours   Other results:  EKG: EKG from today reviewed.  Normal sinus rhythm with resolution of ST segment changes in the lateral leads but persistent ST elevation in V1 to V3 and also anterolateral Q waves.  Imaging: Dg Chest Port 1 View  02/08/2012  *RADIOLOGY REPORT*  Clinical Data: Chest pain.  PORTABLE CHEST - 1 VIEW  Comparison: Chest x-ray 09/29/2006.  Findings: Right costophrenic sulcus is excluded from the margin of the imaged.  Lung volumes are slightly low.  There are linear opacities in the left lower lobe, favored to represent areas of subsegmental atelectasis or scarring.  No definite consolidative airspace disease.  No significant sized pleural effusions. Pulmonary vasculature and the cardiomediastinal silhouette are within normal limits.  No pneumothorax.  IMPRESSION: 1.  Subsegmental atelectasis and/or scarring in the left lower lobe.  No other radiographic evidence to suggest acute cardiopulmonary disease.  Original Report Authenticated By: Florencia Reasons, M.D.      Medications:     Scheduled Medications:    . aspirin  81 mg Oral Daily  .  atorvastatin  80 mg Oral q1800  . metoprolol  2.5 mg Intravenous Q6H  . prasugrel  10 mg Oral Daily  . DISCONTD: heparin         Infusions:    . sodium chloride 20 mL/hr (02/08/12 0940)  . heparin 1,500 Units/hr (02/09/12 0100)     PRN Medications:  acetaminophen, morphine injection, nitroGLYCERIN, ondansetron (ZOFRAN) IV   Assessment:   1. STEMI (ST elevation myocardial infarction)   . Acute anterior STEMI with occlusion of the proximal LAD  2. 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 The Circumflex has a severe proximal stenosis.  3. Successful PTCA/DES x 2 proximal and mid LAD  4. Severe LV systolic dysfunction Ejection fraction 25-30% 5.Atelectasis left lower lobe.    Plan/Discussion:    The patient is scheduled for stage intervention on Tuesday by Dr. Excell Seltzer. Blood pressure is borderline.  Nevertheless I will try to introduce a very small dose of an ACE inhibitor,I will start with captopril 6.25 mg by mouth 3 times a day to be held if systolic blood pressure is less than 90 mmHg.I would avoid routine use of beta blocker currently.  We'll also hold off on nitroglycerin preparations as the patient has no chest pain currently. We'll continue heparin intravenously in anticipation of staged intervention. The patient will also remain on DAPT. Hemoglobin A1c is 5.9 and the patient is at increased future risk for diabetes. I will have Kristen to exercise physiologist discussed this with him and I did allow the patient to walk gently around the unit today under the supervision of Kristen. Also tomorrow if the patient is stable he can take a shower. We'll discontinue  defibrillator patches And give the patient and incentive spirometer   Length of Stay: 2   Jorge Fisher 02/09/2012, 9:40 AM

## 2012-02-10 LAB — CBC
HCT: 39.9 % (ref 39.0–52.0)
Hemoglobin: 13.5 g/dL (ref 13.0–17.0)
MCHC: 33.8 g/dL (ref 30.0–36.0)
RBC: 4.29 MIL/uL (ref 4.22–5.81)

## 2012-02-10 MED ORDER — METOPROLOL TARTRATE 12.5 MG HALF TABLET
12.5000 mg | ORAL_TABLET | Freq: Two times a day (BID) | ORAL | Status: DC
  Administered 2012-02-10 – 2012-02-13 (×7): 12.5 mg via ORAL
  Filled 2012-02-10 (×11): qty 1

## 2012-02-10 MED ORDER — CAPTOPRIL 6.25 MG HALF TABLET
6.2500 mg | ORAL_TABLET | Freq: Two times a day (BID) | ORAL | Status: DC
  Administered 2012-02-10 – 2012-02-11 (×2): 6.25 mg via ORAL
  Filled 2012-02-10 (×3): qty 1

## 2012-02-10 NOTE — Progress Notes (Signed)
Peyton Bottoms, MD, [facility] ABIM Board Certified in Adult Cardiovascular Medicine,Internal Medicine and Critical Care Medicine      Subjective:    Patient reports no chest pain or shortness of breath.  He has no palpitations.  Blood pressure is borderline.  There have been no reported significant arrhythmias.  He has been walking around the unit without any symptoms or shortness of breath or chest pain.    Objective:   Weight Range:  Vital Signs:   Temp:  [97.6 F (36.4 C)-98.7 F (37.1 C)] 98.6 F (37 C) (05/26 0800) Resp:  [14-16] 16  (05/26 0800) BP: (81-106)/(45-66) 96/60 mmHg (05/26 1014) SpO2:  [95 %-98 %] 98 % (05/26 0800) Last BM Date: 02/07/12  Weight change: Filed Weights   02/08/12 0145 02/08/12 0800  Weight: 194 lb 10.7 oz (88.3 kg) 194 lb 0.1 oz (88 kg)    Intake/Output:   Intake/Output Summary (Last 24 hours) at 02/10/12 1111 Last data filed at 02/10/12 0941  Gross per 24 hour  Intake   1549 ml  Output   1100 ml  Net    449 ml     Physical Exam: General:  Well appearing. No resp difficulty HEENT: normal Lungs: Clear breath sounds bilaterally Heart: Regular rate and rhythm normal S1-S2 no murmur rubs or gallops  extremity exam: No edema  Telemetry: Normal sinus rhythm Labs: Basic Metabolic Panel:  Lab 02/08/12 1610 02/08/12 0008 02/08/12 0002  NA 142 143 139  K 4.5 3.3* 3.1*  CL 109 103 100  CO2 27 -- 24  GLUCOSE 97 169* 168*  BUN 10 20 19   CREATININE 0.97 1.20 1.09  CALCIUM 8.8 -- 9.5  MG -- -- --  PHOS -- -- --    Liver Function Tests: No results found for this basename: AST:5,ALT:5,ALKPHOS:5,BILITOT:5,PROT:5,ALBUMIN:5 in the last 168 hours No results found for this basename: LIPASE:5,AMYLASE:5 in the last 168 hours No results found for this basename: AMMONIA:3 in the last 168 hours  CBC:  Lab 02/10/12 0550 02/09/12 0625 02/08/12 0755 02/08/12 0008 02/08/12 0002  WBC 7.7 9.0 4.6 -- 8.0  NEUTROABS -- -- -- -- 6.1  HGB 13.5 13.5  13.0 16.0 14.6  HCT 39.9 40.1 39.6 47.0 43.2  MCV 93.0 93.0 92.7 -- 93.5  PLT 164 171 122* -- 193    Cardiac Enzymes:  Lab 02/08/12 1217 02/08/12 0755 02/08/12 0206  CKTOTAL 3255* 43 207  CKMB 342.9* 2.3 8.7*  CKMBINDEX -- -- --  TROPONINI >25.00* <0.30 <0.30     BNP: BNP (last 3 results) No results found for this basename: PROBNP:3 in the last 8760 hours  ABG    Component Value Date/Time   TCO2 25 02/08/2012 0008     Other results:  EKG: Not obtained  Imaging:  No results found.   Medications:     Scheduled Medications:    . aspirin  81 mg Oral Daily  . atorvastatin  80 mg Oral q1800  . captopril  6.25 mg Oral TID  . metoprolol  2.5 mg Intravenous Q6H  . prasugrel  10 mg Oral Daily  . white petrolatum         Infusions:    . sodium chloride 20 mL/hr at 02/09/12 1800  . heparin 1,350 Units/hr (02/10/12 0941)     PRN Medications:  acetaminophen, morphine injection, nitroGLYCERIN, ondansetron (ZOFRAN) IV   Assessment:   1.  STEMI (ST elevation myocardial infarction)   . Acute anterior STEMI with occlusion of the  proximal LAD  2. 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 The Circumflex has a severe proximal stenosis.  3. Successful PTCA/DES x 2 proximal and mid LAD  4. Severe LV systolic dysfunction Ejection fraction 25-30%  5.Atelectasis left lower lobe.   Plan/Discussion:    Blood pressure borderline low.  We will change his IV beta blocker to by mouth metoprolol 12.5 min. By mouth twice a day.  We'll also cut back on captopril to twice a day dosing at 6.25 mg in order to avoid health dosing. We will check an Electrolyte panel in the morning to make sure renal function is stable. The patient does report interim minor nosebleeds in the event that he has severe epistaxis heparin can be discontinued.  DAPT however should not be interrupted.  The patient will proceed with intervention  to the circumflex coronary artery on Tuesday by Dr. Wyline Mood of Stay: 3   Alvin Critchley [facility] 02/10/2012, 11:11 AM

## 2012-02-10 NOTE — Progress Notes (Signed)
ANTICOAGULATION CONSULT NOTE - Follow Up Consult  Pharmacy Consult for heparin Indication: severe circumflex stenosis awaiting stage intervention Tuesday 5/28  Allergies  Allergen Reactions  . Peanuts (Peanut Oil) Swelling    cashews    Patient Measurements: Height: 5\' 10"  (177.8 cm) Weight: 194 lb 0.1 oz (88 kg) IBW/kg (Calculated) : 73   Vital Signs: Temp: 98.7 F (37.1 C) (05/26 0400) Temp src: Oral (05/26 0400) BP: 94/58 mmHg (05/26 0400)  Labs:  Alvira Philips 02/10/12 0550 02/09/12 0625 02/08/12 2156 02/08/12 1217 02/08/12 0755 02/08/12 0206 02/08/12 0008 02/08/12 0002  HGB 13.5 13.5 -- -- -- -- -- --  HCT 39.9 40.1 -- -- 39.6 -- -- --  PLT 164 171 -- -- 122* -- -- --  APTT -- -- -- -- -- -- -- 31  LABPROT -- -- -- -- -- -- -- 12.8  INR -- -- -- -- -- -- -- 0.94  HEPARINUNFRC 0.77* 0.79* <0.10* -- -- -- -- --  CREATININE -- -- -- -- 0.97 -- 1.20 1.09  CKTOTAL -- -- -- 3255* 43 207 -- --  CKMB -- -- -- 342.9* 2.3 8.7* -- --  TROPONINI -- -- -- >25.00* <0.30 <0.30 -- --    Estimated Creatinine Clearance: 92.8 ml/min (by C-G formula based on Cr of 0.97).   Medications:  Scheduled:     . aspirin  81 mg Oral Daily  . atorvastatin  80 mg Oral q1800  . captopril  6.25 mg Oral TID  . metoprolol  2.5 mg Intravenous Q6H  . prasugrel  10 mg Oral Daily  . white petrolatum        Assessment: Mr. Snuffer is a 56 year old man with limited medical history admitted with chest pain and found to have acute anterior STEMI now s/p DES x2 awaiting stage therapy 5/28. Heparin level 0.77<---0.79 after decrease to 1400 units/hour 5/25, slightly supratherapeutic. Plt/Hgb look great 5/25, today's labs still pending, no signs of bleeding. Goal of Therapy:  Heparin level 0.3-0.7 units/ml Monitor platelets by anticoagulation protocol: Yes   Plan:  1. Decrease heparin to 1350 units/hour 2. F/U HL 5/27  Greyden Besecker, [place] R, PharmD 02/10/2012,9:19 AM

## 2012-02-11 DIAGNOSIS — I219 Acute myocardial infarction, unspecified: Secondary | ICD-10-CM

## 2012-02-11 DIAGNOSIS — I5021 Acute systolic (congestive) heart failure: Secondary | ICD-10-CM

## 2012-02-11 LAB — CBC
HCT: 41.8 % (ref 39.0–52.0)
Hemoglobin: 14.5 g/dL (ref 13.0–17.0)
MCV: 91.5 fL (ref 78.0–100.0)
RBC: 4.57 MIL/uL (ref 4.22–5.81)
RDW: 12.9 % (ref 11.5–15.5)
WBC: 7.8 10*3/uL (ref 4.0–10.5)

## 2012-02-11 LAB — BASIC METABOLIC PANEL
BUN: 17 mg/dL (ref 6–23)
CO2: 21 mEq/L (ref 19–32)
Chloride: 103 mEq/L (ref 96–112)
Glucose, Bld: 82 mg/dL (ref 70–99)
Potassium: 4 mEq/L (ref 3.5–5.1)
Sodium: 134 mEq/L — ABNORMAL LOW (ref 135–145)

## 2012-02-11 MED ORDER — DIAZEPAM 5 MG PO TABS
5.0000 mg | ORAL_TABLET | ORAL | Status: AC
  Administered 2012-02-12: 5 mg via ORAL
  Filled 2012-02-11: qty 1

## 2012-02-11 MED ORDER — ASPIRIN 81 MG PO CHEW
324.0000 mg | CHEWABLE_TABLET | ORAL | Status: AC
  Administered 2012-02-12: 324 mg via ORAL
  Filled 2012-02-11: qty 4

## 2012-02-11 MED ORDER — SODIUM CHLORIDE 0.9 % IJ SOLN
3.0000 mL | Freq: Two times a day (BID) | INTRAMUSCULAR | Status: DC
  Administered 2012-02-11: 3 mL via INTRAVENOUS

## 2012-02-11 MED ORDER — LISINOPRIL 2.5 MG PO TABS
2.5000 mg | ORAL_TABLET | Freq: Every day | ORAL | Status: DC
  Administered 2012-02-11 – 2012-02-13 (×3): 2.5 mg via ORAL
  Filled 2012-02-11 (×5): qty 1

## 2012-02-11 MED ORDER — SODIUM CHLORIDE 0.9 % IJ SOLN: 3.0000 mL | INTRAMUSCULAR | Status: DC | PRN

## 2012-02-11 MED ORDER — SODIUM CHLORIDE 0.9 % IV SOLN: 250.0000 mL | INTRAVENOUS | Status: DC | PRN

## 2012-02-11 MED ORDER — SODIUM CHLORIDE 0.9 % IV SOLN
1.0000 mL/kg/h | INTRAVENOUS | Status: DC
  Administered 2012-02-12: 1 mL/kg/h via INTRAVENOUS

## 2012-02-11 MED ORDER — ASPIRIN 81 MG PO CHEW
81.0000 mg | CHEWABLE_TABLET | Freq: Every day | ORAL | Status: DC
  Administered 2012-02-13: 81 mg via ORAL
  Filled 2012-02-11: qty 1

## 2012-02-11 MED FILL — Dextrose Inj 5%: INTRAVENOUS | Qty: 50 | Status: AC

## 2012-02-11 NOTE — Progress Notes (Signed)
  Echocardiogram 2D Echocardiogram has been performed.  Jorge Fisher A 02/11/2012, 8:59 AM

## 2012-02-11 NOTE — Progress Notes (Signed)
ANTICOAGULATION CONSULT NOTE - Follow Up Consult  Pharmacy Consult for heparin Indication: severe circumflex stenosis awaiting stage intervention Tuesday 5/28  Allergies  Allergen Reactions  . Peanuts (Peanut Oil) Swelling    cashews    Patient Measurements: Height: 5\' 10"  (177.8 cm) Weight: 194 lb 0.1 oz (88 kg) IBW/kg (Calculated) : 73   Vital Signs: Temp: 98.4 F (36.9 C) (05/27 0900) Temp src: Oral (05/27 0900) BP: 105/67 mmHg (05/27 0939) Pulse Rate: 92  (05/27 0939)  Labs:  Basename 02/11/12 0431 02/10/12 0550 02/09/12 0625 02/08/12 1217  HGB 14.5 13.5 -- --  HCT 41.8 39.9 40.1 --  PLT 178 164 171 --  APTT -- -- -- --  LABPROT -- -- -- --  INR -- -- -- --  HEPARINUNFRC 0.67 0.77* 0.79* --  CREATININE 1.15 -- -- --  CKTOTAL -- -- -- 3255*  CKMB -- -- -- 342.9*  TROPONINI -- -- -- >25.00*    Estimated Creatinine Clearance: 78.2 ml/min (by C-G formula based on Cr of 1.15).   Medications:  Scheduled:     . aspirin  324 mg Oral Pre-Cath  . aspirin  81 mg Oral Daily  . atorvastatin  80 mg Oral q1800  . diazepam  5 mg Oral On Call  . lisinopril  2.5 mg Oral Daily  . metoprolol tartrate  12.5 mg Oral BID  . prasugrel  10 mg Oral Daily  . sodium chloride  3 mL Intravenous Q12H  . DISCONTD: captopril  6.25 mg Oral BID    Assessment: Jorge Fisher is a 56 year old man with limited medical history admitted with chest pain and found to have acute anterior STEMI now s/p DES x2 awaiting stage therapy 5/28. Heparin level 0.67<---0.77<---0.79 after decrease to 1350 units/hour 5/26. Plt/Hgb look great today. Goal of Therapy:  Heparin level 0.3-0.7 units/ml Monitor platelets by anticoagulation protocol: Yes   Plan:  1. Continue heparin to 1350 units/hour 2. F/U HL 5/28  Jorge Fisher, [place] R, PharmD 02/11/2012,11:19 AM

## 2012-02-11 NOTE — Progress Notes (Signed)
    SUBJECTIVE: No chest pain or SOB this am. No events.   BP 102/68  Pulse 74  Temp(Src) 98.7 F (37.1 C) (Oral)  Resp 16  Ht 5\' 10"  (1.778 m)  Wt 194 lb 0.1 oz (88 kg)  BMI 27.84 kg/m2  SpO2 98%  Intake/Output Summary (Last 24 hours) at 02/11/12 0745 Last data filed at 02/11/12 0700  Gross per 24 hour  Intake   1645 ml  Output      0 ml  Net   1645 ml    PHYSICAL EXAM General: Well developed, well nourished, in no acute distress. Alert and oriented x 3.  Psych:  Good affect, responds appropriately Neck: No JVD. No masses noted.  Lungs: Clear bilaterally with no wheezes or rhonci noted.  Heart: RRR with no murmurs noted. Abdomen: Bowel sounds are present. Soft, non-tender.  Extremities: No lower extremity edema.   LABS: Basic Metabolic Panel:  Basename 02/11/12 0431 02/08/12 0755  NA 134* 142  K 4.0 4.5  CL 103 109  CO2 21 27  GLUCOSE 82 97  BUN 17 10  CREATININE 1.15 0.97  CALCIUM 9.5 8.8  MG -- --  PHOS -- --   CBC:  Basename 02/11/12 0431 02/10/12 0550  WBC 7.8 7.7  NEUTROABS -- --  HGB 14.5 13.5  HCT 41.8 39.9  MCV 91.5 93.0  PLT 178 164   Cardiac Enzymes:  Basename 02/08/12 1217 02/08/12 0755  CKTOTAL 3255* 43  CKMB 342.9* 2.3  CKMBINDEX -- --  TROPONINI >25.00* <0.30   Fasting Lipid Panel:  Basename 02/08/12 0755  CHOL 123  HDL 58  LDLCALC 48  TRIG 85  CHOLHDL 2.1  LDLDIRECT --    Current Meds:    . aspirin  81 mg Oral Daily  . atorvastatin  80 mg Oral q1800  . captopril  6.25 mg Oral BID  . metoprolol tartrate  12.5 mg Oral BID  . prasugrel  10 mg Oral Daily  . DISCONTD: captopril  6.25 mg Oral TID  . DISCONTD: metoprolol  2.5 mg Intravenous Q6H     ASSESSMENT AND PLAN:  1. CAD: Pt admitted with anterior STEMI on 02/08/12. He was found to have a totally occluded proximal LAD with severe stenosis in the proximal Circumflex and chronic occlusion of the RCA. He is now s/p DES x 2 in the LAD. LVEF is depressed at 25%. He is  doing well. Plans for staged PCI of the Circumflex per Dr. Excell Seltzer tomorrow.  Continue ASA/Brilinta/statin/beta blocker/Ace-inh. Plans for echo today or tomorrow, depending on schedule. NPO at midnight for PCI in am. Transfer to telemetry. Will need to consider Lifevest if LVEF is less than 35% by echo.   Jorge Fisher  5/27/20137:45 AM

## 2012-02-12 ENCOUNTER — Encounter (HOSPITAL_COMMUNITY)
Admission: EM | Disposition: A | Discharge status: Home / Self Care | Payer: Self-pay | Source: Home / Self Care | Attending: Cardiovascular Disease

## 2012-02-12 DIAGNOSIS — I2510 Atherosclerotic heart disease of native coronary artery without angina pectoris: Secondary | ICD-10-CM

## 2012-02-12 HISTORY — PX: PERCUTANEOUS CORONARY STENT INTERVENTION (PCI-S): SHX5485

## 2012-02-12 LAB — BASIC METABOLIC PANEL WITH GFR
BUN: 20 mg/dL (ref 6–23)
CO2: 22 meq/L (ref 19–32)
Calcium: 8.9 mg/dL (ref 8.4–10.5)
Chloride: 103 meq/L (ref 96–112)
Creatinine, Ser: 1.19 mg/dL (ref 0.50–1.35)
GFR calc Af Amer: 76 mL/min — ABNORMAL LOW
GFR calc non Af Amer: 66 mL/min — ABNORMAL LOW
Glucose, Bld: 93 mg/dL (ref 70–99)
Potassium: 3.9 meq/L (ref 3.5–5.1)
Sodium: 136 meq/L (ref 135–145)

## 2012-02-12 LAB — CBC
HCT: 40.8 % (ref 39.0–52.0)
Hemoglobin: 14 g/dL (ref 13.0–17.0)
MCH: 31.6 pg (ref 26.0–34.0)
MCHC: 34.3 g/dL (ref 30.0–36.0)
MCV: 92.1 fL (ref 78.0–100.0)
Platelets: 193 10*3/uL (ref 150–400)
RBC: 4.43 MIL/uL (ref 4.22–5.81)
RDW: 13 % (ref 11.5–15.5)
WBC: 6.5 10*3/uL (ref 4.0–10.5)

## 2012-02-12 LAB — PROTIME-INR: Prothrombin Time: 14.4 seconds (ref 11.6–15.2)

## 2012-02-12 LAB — HEPARIN LEVEL (UNFRACTIONATED): Heparin Unfractionated: 0.41 IU/mL (ref 0.30–0.70)

## 2012-02-12 SURGERY — PERCUTANEOUS CORONARY STENT INTERVENTION (PCI-S)

## 2012-02-12 MED ORDER — ONDANSETRON HCL 4 MG/2ML IJ SOLN: 4.0000 mg | Freq: Four times a day (QID) | INTRAMUSCULAR | Status: DC | PRN

## 2012-02-12 MED ORDER — LIDOCAINE HCL (PF) 1 % IJ SOLN
INTRAMUSCULAR | Status: AC
  Filled 2012-02-12: qty 30

## 2012-02-12 MED ORDER — BIVALIRUDIN 250 MG IV SOLR
INTRAVENOUS | Status: AC
  Filled 2012-02-12: qty 250

## 2012-02-12 MED ORDER — NITROGLYCERIN 0.2 MG/ML ON CALL CATH LAB
INTRAVENOUS | Status: AC
  Filled 2012-02-12: qty 1

## 2012-02-12 MED ORDER — SODIUM CHLORIDE 0.9 % IJ SOLN: 3.0000 mL | Freq: Two times a day (BID) | INTRAMUSCULAR | Status: DC

## 2012-02-12 MED ORDER — SODIUM CHLORIDE 0.9 % IJ SOLN: 3.0000 mL | INTRAMUSCULAR | Status: DC | PRN

## 2012-02-12 MED ORDER — SODIUM CHLORIDE 0.9 % IV SOLN: 250.0000 mL | INTRAVENOUS | Status: DC

## 2012-02-12 MED ORDER — HEPARIN (PORCINE) IN NACL 2-0.9 UNIT/ML-% IJ SOLN
INTRAMUSCULAR | Status: AC
  Filled 2012-02-12: qty 2000

## 2012-02-12 MED ORDER — SODIUM CHLORIDE 0.9 % IV SOLN
1.0000 mL/kg/h | INTRAVENOUS | Status: AC
  Administered 2012-02-12: 1 mL/kg/h via INTRAVENOUS

## 2012-02-12 MED ORDER — MIDAZOLAM HCL 2 MG/2ML IJ SOLN
INTRAMUSCULAR | Status: AC
  Filled 2012-02-12: qty 2

## 2012-02-12 MED ORDER — ACETAMINOPHEN 325 MG PO TABS: 650.0000 mg | ORAL_TABLET | ORAL | Status: DC | PRN

## 2012-02-12 MED ORDER — FENTANYL CITRATE 0.05 MG/ML IJ SOLN
INTRAMUSCULAR | Status: AC
  Filled 2012-02-12: qty 2

## 2012-02-12 NOTE — Progress Notes (Signed)
TR BAND REMOVAL  LOCATION:  right radial  DEFLATED PER PROTOCOL:  yes  TIME BAND OFF / DRESSING APPLIED:   1400   SITE UPON ARRIVAL:   Level 0  SITE AFTER BAND REMOVAL:  Level 0  REVERSE ALLEN'S TEST:    positive  CIRCULATION SENSATION AND MOVEMENT:  Within Normal Limits  yes  COMMENTS:     

## 2012-02-12 NOTE — CV Procedure (Signed)
   CARDIAC CATH NOTE  Name: Jorge Fisher MRN: [id number] DOB: 1956/08/24  Procedure: PTCA and stenting of the Left Circumflex  Indication: Staged PCI after Anterolateral STEMI. Critical residual LCx stenosis involving a large area of myocardium.  Procedural Details: The right wrist was prepped, draped, and anesthetized with 1% lidocaine. Using the modified Seldinger technique, a 6 Fr sheath was introduced into the radial artery. 2.5 mg nicardipine was administered through the radial sheath. Weight-based bivalirudin was given for anticoagulation. Once a therapeutic ACT was achieved, a 6 [place] XB-LAD 3.5 cm guide catheter was inserted.  A Cougar coronary guidewire was used to cross the lesion.  The lesion was predilated with a 2.5x12 mm balloon.  The lesion was then stented with a 3.0x24 mm Promus Element drug-eluting stent.  The stent was postdilated with a 3.25x55mm noncompliant balloon to 16 atm.  Following PCI, there was 0% residual stenosis and TIMI-3 flow. Final angiography confirmed an excellent result. There was diffuse residual downstream stenosis, but I thought this was best left to medical therapy. The patient tolerated the procedure well. There were no immediate procedural complications. A TR band was used for radial hemostasis. The patient was transferred to the post catheterization recovery area for further monitoring.  Lesion Data: Vessel: Left Circumflex (proximal) Percent stenosis (pre): 95 TIMI-flow (pre):  3 Stent:  3.0x24 mm Promus Element DES Percent stenosis (post): 0 TIMI-flow (post): 3  Conclusions: Successful staged PCI of the Left Circumflex  Recommendations: Continue DAPT with ASA and Effient x 12 months minimum. Likely discharge home tomorrow.  [name] 02/12/2012, 8:34 AM

## 2012-02-12 NOTE — Progress Notes (Signed)
    SUBJECTIVE: No chest pain or SOB. No events. Mild nosebleed.   BP 132/69  Pulse 78  Temp(Src) 97.6 F (36.4 C) (Oral)  Resp 18  Ht 5\' 10"  (1.778 m)  Wt 187 lb 9.6 oz (85.095 kg)  BMI 26.92 kg/m2  SpO2 100%  Intake/Output Summary (Last 24 hours) at 02/12/12 0716 Last data filed at 02/12/12 0547  Gross per 24 hour  Intake  703.5 ml  Output    375 ml  Net  328.5 ml    PHYSICAL EXAM General: Well developed, well nourished, in no acute distress. Alert and oriented x 3.  Psych:  Good affect, responds appropriately Neck: No JVD. No masses noted.  Lungs: Clear bilaterally with no wheezes or rhonci noted.  Heart: RRR with no murmurs noted. Abdomen: Bowel sounds are present. Soft, non-tender.  Extremities: No lower extremity edema.   LABS: Basic Metabolic Panel:  Basename 02/12/12 0508 02/11/12 0431  NA 136 134*  K 3.9 4.0  CL 103 103  CO2 22 21  GLUCOSE 93 82  BUN 20 17  CREATININE 1.19 1.15  CALCIUM 8.9 9.5  MG -- --  PHOS -- --   CBC:  Basename 02/12/12 0508 02/11/12 0431  WBC 6.5 7.8  NEUTROABS -- --  HGB 14.0 14.5  HCT 40.8 41.8  MCV 92.1 91.5  PLT 193 178     Current Meds:    . aspirin  324 mg Oral Pre-Cath  . aspirin  81 mg Oral Daily  . atorvastatin  80 mg Oral q1800  . diazepam  5 mg Oral On Call  . lisinopril  2.5 mg Oral Daily  . metoprolol tartrate  12.5 mg Oral BID  . prasugrel  10 mg Oral Daily  . sodium chloride  3 mL Intravenous Q12H  . DISCONTD: aspirin  81 mg Oral Daily  . DISCONTD: captopril  6.25 mg Oral BID   Echo 02/11/12:  Left ventricle: The cavity size was normal. Wall thickness was normal. Systolic function was normal. The estimated ejection fraction was in the range of 50% to 55%. Mild hypokinesis of the apical myocardium.   ASSESSMENT AND PLAN: 1. CAD: Pt admitted with anterior STEMI on 02/08/12. He was found to have a totally occluded proximal LAD with severe stenosis in the proximal Circumflex and chronic occlusion  of the RCA. He is now s/p DES x 2 in the LAD. LVEF by echo yesterday is in the normal range, 50-55%. He is doing well. Plans for staged PCI of the Circumflex per Dr. Excell Seltzer today.  Continue ASA/Brilinta/statin/beta blocker/Ace-inh.    Oreatha Fabry  5/28/20137:16 AM

## 2012-02-12 NOTE — H&P (View-Only) (Signed)
    SUBJECTIVE: No chest pain or SOB. No events. Mild nosebleed.   BP 132/69  Pulse 78  Temp(Src) 97.6 F (36.4 C) (Oral)  Resp 18  Ht 5' 10" (1.778 m)  Wt 187 lb 9.6 oz (85.095 kg)  BMI 26.92 kg/m2  SpO2 100%  Intake/Output Summary (Last 24 hours) at 02/12/12 0716 Last data filed at 02/12/12 0547  Gross per 24 hour  Intake  703.5 ml  Output    375 ml  Net  328.5 ml    PHYSICAL EXAM General: Well developed, well nourished, in no acute distress. Alert and oriented x 3.  Psych:  Good affect, responds appropriately Neck: No JVD. No masses noted.  Lungs: Clear bilaterally with no wheezes or rhonci noted.  Heart: RRR with no murmurs noted. Abdomen: Bowel sounds are present. Soft, non-tender.  Extremities: No lower extremity edema.   LABS: Basic Metabolic Panel:  Basename 02/12/12 0508 02/11/12 0431  NA 136 134*  K 3.9 4.0  CL 103 103  CO2 22 21  GLUCOSE 93 82  BUN 20 17  CREATININE 1.19 1.15  CALCIUM 8.9 9.5  MG -- --  PHOS -- --   CBC:  Basename 02/12/12 0508 02/11/12 0431  WBC 6.5 7.8  NEUTROABS -- --  HGB 14.0 14.5  HCT 40.8 41.8  MCV 92.1 91.5  PLT 193 178     Current Meds:    . aspirin  324 mg Oral Pre-Cath  . aspirin  81 mg Oral Daily  . atorvastatin  80 mg Oral q1800  . diazepam  5 mg Oral On Call  . lisinopril  2.5 mg Oral Daily  . metoprolol tartrate  12.5 mg Oral BID  . prasugrel  10 mg Oral Daily  . sodium chloride  3 mL Intravenous Q12H  . DISCONTD: aspirin  81 mg Oral Daily  . DISCONTD: captopril  6.25 mg Oral BID   Echo 02/11/12:  Left ventricle: The cavity size was normal. Wall thickness was normal. Systolic function was normal. The estimated ejection fraction was in the range of 50% to 55%. Mild hypokinesis of the apical myocardium.   ASSESSMENT AND PLAN: 1. CAD: Pt admitted with anterior STEMI on 02/08/12. He was found to have a totally occluded proximal LAD with severe stenosis in the proximal Circumflex and chronic occlusion  of the RCA. He is now s/p DES x 2 in the LAD. LVEF by echo yesterday is in the normal range, 50-55%. He is doing well. Plans for staged PCI of the Circumflex per Dr. Cooper today.  Continue ASA/Brilinta/statin/beta blocker/Ace-inh.    Ab Leaming  5/28/20137:16 AM  

## 2012-02-12 NOTE — Interval H&P Note (Signed)
History and Physical Interval Note:  02/12/2012 7:45 AM  Jorge Fisher  has presented today for surgery, with the diagnosis of cp  The various methods of treatment have been discussed with the patient and family. After consideration of risks, benefits and other options for treatment, the patient has consented to  Procedure(s) (LRB): PERCUTANEOUS CORONARY STENT INTERVENTION (PCI-S) (N/A) as a surgical intervention .  The patients' history has been reviewed, patient examined, no change in status, stable for surgery.  I have reviewed the patients' chart and labs.  Questions were answered to the patient's satisfaction.     [name]  02/12/2012 7:45 AM

## 2012-02-13 ENCOUNTER — Encounter (HOSPITAL_COMMUNITY): Payer: Self-pay | Admitting: Physician Assistant

## 2012-02-13 DIAGNOSIS — I2109 ST elevation (STEMI) myocardial infarction involving other coronary artery of anterior wall: Principal | ICD-10-CM

## 2012-02-13 LAB — CBC
HCT: 38.6 % — ABNORMAL LOW (ref 39.0–52.0)
Hemoglobin: 13.2 g/dL (ref 13.0–17.0)
MCH: 31.1 pg (ref 26.0–34.0)
MCHC: 34.2 g/dL (ref 30.0–36.0)
MCV: 91 fL (ref 78.0–100.0)
RBC: 4.24 MIL/uL (ref 4.22–5.81)

## 2012-02-13 LAB — BASIC METABOLIC PANEL
BUN: 13 mg/dL (ref 6–23)
CO2: 24 mEq/L (ref 19–32)
Calcium: 8.7 mg/dL (ref 8.4–10.5)
Creatinine, Ser: 1.1 mg/dL (ref 0.50–1.35)
GFR calc non Af Amer: 72 mL/min — ABNORMAL LOW (ref >90)
Glucose, Bld: 93 mg/dL (ref 70–99)

## 2012-02-13 LAB — HEPATIC FUNCTION PANEL
ALT: 82 U/L — ABNORMAL HIGH (ref 0–53)
Alkaline Phosphatase: 78 U/L (ref 39–117)
Total Bilirubin: 0.4 mg/dL (ref 0.3–1.2)

## 2012-02-13 MED ORDER — ATORVASTATIN CALCIUM 80 MG PO TABS: 80.0000 mg | ORAL_TABLET | Freq: Every day | ORAL | Status: DC

## 2012-02-13 MED ORDER — LISINOPRIL 2.5 MG PO TABS: 2.5000 mg | ORAL_TABLET | Freq: Every day | ORAL | Status: DC

## 2012-02-13 MED ORDER — PRASUGREL HCL 10 MG PO TABS: 10.0000 mg | ORAL_TABLET | Freq: Every day | ORAL | Status: DC

## 2012-02-13 MED ORDER — METOPROLOL TARTRATE 25 MG PO TABS: 12.5000 mg | ORAL_TABLET | Freq: Two times a day (BID) | ORAL | Status: DC

## 2012-02-13 MED ORDER — NITROGLYCERIN 0.4 MG SL SUBL: 0.4000 mg | SUBLINGUAL_TABLET | SUBLINGUAL | Status: DC | PRN

## 2012-02-13 MED ORDER — ASPIRIN 81 MG PO TBEC: 81.0000 mg | DELAYED_RELEASE_TABLET | Freq: Every day | ORAL | Status: AC

## 2012-02-13 MED FILL — Dextrose Inj 5%: INTRAVENOUS | Qty: 50 | Status: AC

## 2012-02-13 NOTE — Progress Notes (Signed)
    SUBJECTIVE: No chest pain or SOB. No events.   BP 107/61  Pulse 77  Temp(Src) 98.2 F (36.8 C) (Oral)  Resp 16  Ht 5\' 10"  (1.778 m)  Wt 191 lb 5.8 oz (86.8 kg)  BMI 27.46 kg/m2  SpO2 98%  Intake/Output Summary (Last 24 hours) at 02/13/12 0723 Last data filed at 02/13/12 0504  Gross per 24 hour  Intake    419 ml  Output   1050 ml  Net   -631 ml    PHYSICAL EXAM General: Well developed, well nourished, in no acute distress. Alert and oriented x 3.  Psych:  Good affect, responds appropriately Neck: No JVD. No masses noted.  Lungs: Clear bilaterally with no wheezes or rhonci noted.  Heart: RRR with no murmurs noted. Abdomen: Bowel sounds are present. Soft, non-tender.  Extremities: No lower extremity edema. Right wrist cath site ok.   LABS: Basic Metabolic Panel:  Basename 02/13/12 0419 02/12/12 0508  NA 138 136  K 3.8 3.9  CL 104 103  CO2 24 22  GLUCOSE 93 93  BUN 13 20  CREATININE 1.10 1.19  CALCIUM 8.7 8.9  MG -- --  PHOS -- --   CBC:  Basename 02/13/12 0419 02/12/12 0508  WBC 6.1 6.5  NEUTROABS -- --  HGB 13.2 14.0  HCT 38.6* 40.8  MCV 91.0 92.1  PLT 189 193    Current Meds:    . aspirin  81 mg Oral Daily  . atorvastatin  80 mg Oral q1800  . bivalirudin      . fentaNYL      . heparin      . lidocaine      . lisinopril  2.5 mg Oral Daily  . metoprolol tartrate  12.5 mg Oral BID  . midazolam      . nitroGLYCERIN      . prasugrel  10 mg Oral Daily  . DISCONTD: sodium chloride  3 mL Intravenous Q12H  . DISCONTD: sodium chloride  3 mL Intravenous Q12H     ASSESSMENT AND PLAN:  1. CAD: Pt admitted with anterior STEMI on 02/08/12. He was found to have a totally occluded proximal LAD with severe stenosis in the proximal Circumflex and chronic occlusion of the RCA. He is now s/p DES x 2 in the LAD on 02/08/12 and staged PCI of the Circumflex yesterday with DES x 1 in the proximal Circumflex. LVEF by echo 02/11/12 is in the normal range, 50-55%. He  is doing well.  Continue ASA/Brilinta/statin/beta blocker/Ace-inh. 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   2. Dispo: D/C home today. Follow up with me in 3 weeks. Effient starter packet. Work excuse until seen by our office in 3 weeks.     Adraine Biffle  5/29/20137:23 AM

## 2012-02-13 NOTE — Discharge Summary (Signed)
Full note this am. cdm

## 2012-02-13 NOTE — Discharge Summary (Signed)
Discharge Summary   Patient ID: Jorge Fisher MRN: [id number], DOB/AGE: Mar 10, 1956 56 y.o. Admit date: 02/07/2012 D/C date:     02/13/2012   Primary Discharge Diagnoses:  1. Newly diagnosed CAD with acute anterior STEMI - totally occluded LAD s/p PTCA/DES x 2 to prox and mid LAD 02/08/12 - s/p staged PTCA/DES to Cx 02/12/12 - diagonal dz for med rx, chronically occ RCA with collaterals 2. Ischemic cardiomyopathy - EF 25-30% by cath 02/08/12, improved to 50-55% by echo 02/11/12  Secondary Discharge Diagnoses:  1. H/o mild HTN with borderline low blood pressures this admission 2. Hemorrhoids 3. Colorectal polyps  Hospital Course: 56 y/o M with no hx of CAD presented to ER via EMS complaining of chest pain/pressure associated with nausea and mild SOB. He was found to have an acute anterior STEMI and was taken emergently to the cath lab where he was loaded with aspirin and Effient. He was also started on statin. Cath demonstrated occlusion of the proximal LAD, severe stenosis in the Circumflex and RCA. The RCA was chronically occluded and fills from collaterals. The Circumflex had a severe proximal stenosis. Dr. Clifton James performed Successful PTCA/DES x 2 proximal and mid LAD. EF was 25-30% with severe hypokinesis of the anterior wall, apex and inferoapical wall. His blood pressure was on the lower side, limiting med titration. Heparin was restarted due to plans for staged Cx intervention. Meanwhile, while awaiting re-cath, 2D echo was obtained 02/11/12 demonstrating a recovery in LV function with EF of 50-55%. He also ambulated with cardiac rehab and received education regarding CAD as well as hyperglycemia given A1C of 5.9. On 02/12/12, he underwent staged PTCA and stenting with DES to the LCx. He tolerated the procedure well. Today he is feeling well without complaints. Dr. Clifton James notes we will medically manage his diagonal stenosis. Jorge Fisher BP is tolerating low-dose BB and ACEI. The patient was  seen and examined today and felt stable for discharge by Dr. Clifton James.  Discharge Vitals: Blood pressure 113/70, pulse 71, temperature 97.9 F (36.6 C), temperature source Oral, resp. rate 16, height 5\' 10"  (1.778 m), weight 191 lb 5.8 oz (86.8 kg), SpO2 98.00%.  Labs: Lab Results  Component Value Date   WBC 6.1 02/13/2012   HGB 13.2 02/13/2012   HCT 38.6* 02/13/2012   MCV 91.0 02/13/2012   PLT 189 02/13/2012     Lab 02/13/12 0419  NA 138  K 3.8  CL 104  CO2 24  BUN 13  CREATININE 1.10  CALCIUM 8.7  PROT --  BILITOT --  ALKPHOS --  ALT --  AST --  GLUCOSE 93    Lab Results  Component Value Date   CHOL 123 02/08/2012   HDL 58 02/08/2012   LDLCALC 48 02/08/2012   TRIG 85 02/08/2012   Results for Jorge Fisher (MRN [id number]) as of 02/13/2012 08:59  Ref. Range 02/08/2012 02:06 02/08/2012 07:55 02/08/2012 12:17  CK, MB Latest Range: 0.3-4.0 ng/mL 8.7 (HH) 2.3 342.9 (HH)  CK Total Latest Range: 7-232 U/L 207 43 3255 (H)  Troponin I Latest Range: <0.30 ng/mL <0.30 <0.30 >25.00 (HH)     Diagnostic Studies/Procedures   1. Chest Port 1 View 02/08/2012  *RADIOLOGY REPORT*  Clinical Data: Chest pain.  PORTABLE CHEST - 1 VIEW  Comparison: Chest x-ray 09/29/2006.  Findings: Right costophrenic sulcus is excluded from the margin of the imaged.  Lung volumes are slightly low.  There are linear opacities in the left lower lobe, favored to  represent areas of subsegmental atelectasis or scarring.  No definite consolidative airspace disease.  No significant sized pleural effusions. Pulmonary vasculature and the cardiomediastinal silhouette are within normal limits.  No pneumothorax.  IMPRESSION: 1.  Subsegmental atelectasis and/or scarring in the left lower lobe.  No other radiographic evidence to suggest acute cardiopulmonary disease.  Original Report Authenticated By: Florencia Reasons, M.D.   2. Cardiac catheterization this admission, please see full report and above for summary.  3. 2D Echo  02/11/12 Study Conclusions Left ventricle: The cavity size was normal. Wall thickness was normal. Systolic function was normal. The estimated ejection fraction was in the range of 50% to 55%. Mild hypokinesis of the apical myocardium.  Discharge Medications   Medication List  As of 02/13/2012  9:02 AM   STOP taking these medications         fish oil-omega-3 fatty acids 1000 MG capsule         TAKE these medications         aspirin 81 MG EC tablet   Take 1 tablet (81 mg total) by mouth daily.      atorvastatin 80 MG tablet   Commonly known as: LIPITOR   Take 1 tablet (80 mg total) by mouth at bedtime.      cholecalciferol 1000 UNITS tablet   Commonly known as: VITAMIN D   Take 1,000 Units by mouth every other day.      Fish Oil 1000 MG Caps   Take 1 capsule by mouth 2 (two) times daily.      lisinopril 2.5 MG tablet   Commonly known as: PRINIVIL,ZESTRIL   Take 1 tablet (2.5 mg total) by mouth daily.      MAGNESIUM PO   Take 1 tablet by mouth daily.      metoprolol tartrate 25 MG tablet   Commonly known as: LOPRESSOR   Take 0.5 tablets (12.5 mg total) by mouth 2 (two) times daily.      multivitamin with minerals tablet   Take 1 tablet by mouth daily.      nitroGLYCERIN 0.4 MG SL tablet   Commonly known as: NITROSTAT   Place 1 tablet (0.4 mg total) under the tongue every 5 (five) minutes x 3 doses as needed for chest pain.      prasugrel 10 MG Tabs   Commonly known as: EFFIENT   Take 1 tablet (10 mg total) by mouth daily.      vitamin C 1000 MG tablet   Take 1,000 mg by mouth daily.          his duplicate fish oil was d/c'd from his med list on admission by pharmacy tech. That is why it is listed up above twice.  Disposition   The patient will be discharged in stable condition to home. Discharge Orders    Future Appointments: Provider: Department: Dept Phone: Center:   03/04/2012 3:00 PM Kathleene Hazel, MD [facility] [phone]  [facility]     Future Orders Please Complete By Expires   Diet - low sodium heart healthy      Increase activity slowly      Comments:   No driving for 2 weeks. No lifting over 10 lbs for 4 weeks. No sexual activity for 4 weeks. You may not return to work until cleared by your cardiologist. Keep procedure site clean & dry. If you notice increased pain, swelling, bleeding or pus, call/return!  You may shower, but no soaking baths/hot tubs/pools for 1  week.       Follow-up Information    Follow up with [facility], MD. (03/04/12 at 3pm)    Contact information:   [facility] [address] [place] [address] [phone]            Duration of Discharge Encounter: Greater than 30 minutes including physician and PA time.  Signed, Alesana Magistro PA-C 02/13/2012, 9:02 AM

## 2012-02-13 NOTE — Progress Notes (Signed)
Pt had already walked this am. Feels good, no c/o. Ed completed and pt voiced understanding. Sts he was very health conscious PTA. Discussed stress relief as well. Requests his name be sent to [facility]. [id number] Ethelda Chick CES, ACSM

## 2012-02-15 MED FILL — Nicardipine HCl IV Soln 2.5 MG/ML: INTRAVENOUS | Qty: 1 | Status: AC

## 2012-03-04 ENCOUNTER — Encounter: Payer: 59 | Admitting: Cardiovascular Disease

## 2012-03-11 ENCOUNTER — Encounter: Payer: Self-pay | Admitting: Cardiovascular Disease

## 2012-03-11 ENCOUNTER — Ambulatory Visit (INDEPENDENT_AMBULATORY_CARE_PROVIDER_SITE_OTHER): Payer: 59 | Admitting: Cardiovascular Disease

## 2012-03-11 VITALS — BP 111/74 | HR 64 | Ht 70.0 in | Wt 186.0 lb

## 2012-03-11 DIAGNOSIS — I2510 Atherosclerotic heart disease of native coronary artery without angina pectoris: Secondary | ICD-10-CM | POA: Insufficient documentation

## 2012-03-11 NOTE — Progress Notes (Signed)
History of Present Illness: 56 yo male with history of HTN, CAD here today for cardiac follow up. He was admitted to [facility] 02/08/12 with an acute anterior STEMI. His LAD was completely occluded. This was opened acutely and two DES were placed in the proximal and mid LAD. PCI of the severe Circumflex lesion was staged on 02/12/12. A DES was placed in the proximal circumflex by Dr. Excell Seltzer. The large, dominant RCA is subtotally occluded proximally but filled via collaterals. His LV function was normal on echo 02/11/12.   He tells me today that he is feeling well. He has no chest pain, SOB, palpitations, near syncope or syncope. He chose not to enroll in cardiac rehab. He has been exercsing.   Primary Care Physician: None  Last Lipid Profile:  Lipid Panel     Component Value Date/Time   CHOL 123 02/08/2012 0755   TRIG 85 02/08/2012 0755   HDL 58 02/08/2012 0755   CHOLHDL 2.1 02/08/2012 0755   VLDL 17 02/08/2012 0755   LDLCALC 48 02/08/2012 0755     Past Medical History  Diagnosis Date  . Hypertension     Mild HTN in past, but more recently borderline low blood pressures  . Hemorrhoids   . Colorectal polyps   . CAD (coronary artery disease)     a) diagnosed with acute anterior STEMI with totally occluded LAD s/p PTCA/DES x 2 to prox and mid LAD 02/08/12, staged PTCA/DES to Cx 02/12/12, diagonal dz for med rx, & chronically occ RCA with collaterals.   . Ischemic cardiomyopathy     Improved - EF 25-30% by cath 02/08/12 then 50-55% by echo 02/11/12    Past Surgical History  Procedure Date  . None     Current Outpatient Prescriptions  Medication Sig Dispense Refill  . Ascorbic Acid (VITAMIN C) 1000 MG tablet Take 1,000 mg by mouth daily.        Marland Kitchen aspirin 81 MG EC tablet Take 1 tablet (81 mg total) by mouth daily.      Marland Kitchen atorvastatin (LIPITOR) 80 MG tablet Take 1 tablet (80 mg total) by mouth at bedtime.  30 tablet  6  . cholecalciferol (VITAMIN D) 1000 UNITS tablet Take 1,000  Units by mouth every other day.      . Coenzyme Q10 (CO Q 10 PO) Take by mouth.      . Garlic 100 MG TABS Take by mouth. DAILY      . KRILL OIL PO Take by mouth. DAILY      . lisinopril (PRINIVIL,ZESTRIL) 2.5 MG tablet Take 1 tablet (2.5 mg total) by mouth daily.  30 tablet  6  . MAGNESIUM PO Take 1 tablet by mouth daily.      . metoprolol tartrate (LOPRESSOR) 25 MG tablet Take 0.5 tablets (12.5 mg total) by mouth 2 (two) times daily.  30 tablet  6  . Multiple Vitamins-Minerals (MULTIVITAMIN WITH MINERALS) tablet Take 1 tablet by mouth daily.        . nitroGLYCERIN (NITROSTAT) 0.4 MG SL tablet Place 1 tablet (0.4 mg total) under the tongue every 5 (five) minutes x 3 doses as needed for chest pain.  25 tablet  4  . Omega-3 Fatty Acids (FISH OIL) 1000 MG CAPS Take 1 capsule by mouth 2 (two) times daily.      Marland Kitchen OVER THE COUNTER MEDICATION PLANT STERILES TO HELP LOWER CHOL      . OVER THE COUNTER MEDICATION HAWTRHORN BERRIES      .  prasugrel (EFFIENT) 10 MG TABS Take 1 tablet (10 mg total) by mouth daily.  30 tablet  6    Allergies  Allergen Reactions  . Peanuts (Peanut Oil) Swelling    cashews    History   Social History  . Marital Status: Single    Spouse Name: N/A    Number of Children: N/A  . Years of Education: N/A   Occupational History  . Not on file.   Social History Main Topics  . Smoking status: Never Smoker   . Smokeless tobacco: Not on file  . Alcohol Use: No  . Drug Use: No  . Sexually Active: Not on file   Other Topics Concern  . Not on file   Social History Narrative  . No narrative on file    Family History  Problem Relation Age of Onset  . Heart attack Brother 55  . Heart disease Father 69  . Diabetes type II Sister   . Diabetes type II Brother     Review of Systems:  As stated in the HPI and otherwise negative.   BP 111/74  Pulse 64  Ht 5\' 10"  (1.778 m)  Wt 186 lb (84.369 kg)  BMI 26.69 kg/m2  Physical Examination: General: Well developed,  well nourished, NAD HEENT: OP clear, mucus membranes moist SKIN: warm, dry. No rashes. Neuro: No focal deficits Musculoskeletal: Muscle strength 5/5 all ext Psychiatric: Mood and affect normal Neck: No JVD, no carotid bruits, no thyromegaly, no lymphadenopathy. Lungs:Clear bilaterally, no wheezes, rhonci, crackles Cardiovascular: Regular rate and rhythm. No murmurs, gallops or rubs. Abdomen:Soft. Bowel sounds present. Non-tender.  Extremities: No lower extremity edema. Pulses are 2 + in the bilateral DP/PT.  Cardiac cath 02/08/12:  Left main: Ostial 40% stenosis.  Left Anterior Descending Artery: 100% proximal occlusion. After the vessel was opened, he was found to have diffuse disease from the proximal vessel down through the mid vessel. The distal vessel has mild plaque disease. The diagonal is moderate sized and has diffuse 80% stenosis throughout the proximal segment.  Circumflex Artery: Moderate sized vessel with proximal 99% stenosis. The first OM is moderate sized and has 70% proximal stenosis. This vessel has moderate 50% stenosis distally. The AV groove Circumflex is very small.  Right Coronary Artery: Large, dominant vessel. 99% proximal stenosis. The mid vessel has subtotal occlusion. This appears chronic. The PDA and PLA fill from left to right and right to right collaterals.  Left Ventricular Angiogram: LVEF 25-30% with severe hypokinesis of the anterior wall, apex and inferoapical wall.  Distal aorta: No evidence of aneurysm or stenosis.   PCI LAD DES x2 02/08/12, PCI Circumflex DES x1 02/12/12.

## 2012-03-11 NOTE — Patient Instructions (Addendum)
Your physician wants you to follow-up in: 6 months.You will receive a reminder letter in the mail two months in advance. If you don't receive a letter, please call our office to schedule the follow-up appointment.   Your physician recommends that you return for fasting lab work in: 8 weeks--Week of May 05, 2012.  Lipid and Liver profile.  The lab opens at 8:30 daily

## 2012-03-11 NOTE — Assessment & Plan Note (Signed)
S/p recent STEMI. DES placed in LAD and Circumflex. He is doing well. Continue current meds including dual anti-platelet therapy with ASA and Brilinta for at least one year.  LV function normal. He can resume full  Exercise program. BP is well controlled. Lipids are controlled. No changes today. Repeat LFTs and lipids in 8 weeks.

## 2012-04-15 ENCOUNTER — Telehealth: Payer: Self-pay | Admitting: Cardiovascular Disease

## 2012-04-15 NOTE — Telephone Encounter (Signed)
Walk In pt Form " pt Needs RTW Note" sent to Message Nurse 04/15/12/KM

## 2012-04-16 ENCOUNTER — Encounter: Payer: Self-pay | Admitting: *Deleted

## 2012-04-16 ENCOUNTER — Telehealth: Payer: Self-pay | Admitting: *Deleted

## 2012-04-16 NOTE — Telephone Encounter (Signed)
Pt came into office today to pick up note. He states he is feeling well. Has desk job. Reviewed with Dr. Shirlee Latch and OK for pt to return to work. Letter written and given to pt

## 2012-04-16 NOTE — Telephone Encounter (Signed)
Pt came into office yesterday and walk in pt form completed requesting return to work note. I called pt at call back number listed. Left message with person answering phone to have pt call back

## 2012-04-24 ENCOUNTER — Telehealth: Payer: Self-pay | Admitting: *Deleted

## 2012-04-24 NOTE — Telephone Encounter (Signed)
Pt came into office today with FMLA paperwork to be completed. Paperwork completed by Dr. Clifton James. Original given to pt. Copy given to Kim in medical records to scan.

## 2012-04-24 NOTE — Telephone Encounter (Signed)
Copy Scanned Into EPIC 04/24/12/KM

## 2012-05-06 ENCOUNTER — Other Ambulatory Visit (INDEPENDENT_AMBULATORY_CARE_PROVIDER_SITE_OTHER): Payer: 59

## 2012-05-06 DIAGNOSIS — I2510 Atherosclerotic heart disease of native coronary artery without angina pectoris: Secondary | ICD-10-CM

## 2012-05-06 LAB — LIPID PANEL
Cholesterol: 87 mg/dL (ref 0–200)
LDL Cholesterol: 37 mg/dL (ref 0–99)
Triglycerides: 38 mg/dL (ref 0.0–149.0)
VLDL: 7.6 mg/dL (ref 0.0–40.0)

## 2012-05-06 LAB — HEPATIC FUNCTION PANEL
AST: 22 U/L (ref 0–37)
Albumin: 3.9 g/dL (ref 3.5–5.2)
Total Bilirubin: 1 mg/dL (ref 0.3–1.2)

## 2012-05-14 ENCOUNTER — Telehealth: Payer: Self-pay | Admitting: Cardiovascular Disease

## 2012-05-14 NOTE — Telephone Encounter (Signed)
Spoke with pt and reviewed lipid and liver profiles.

## 2012-05-14 NOTE — Telephone Encounter (Signed)
New Problem: ° ° ° °Patient returned your call about his lab work.  Please call back. °

## 2012-09-04 ENCOUNTER — Other Ambulatory Visit: Payer: Self-pay

## 2012-09-04 MED ORDER — ATORVASTATIN CALCIUM 80 MG PO TABS: 80.0000 mg | ORAL_TABLET | Freq: Every day | ORAL | Status: DC

## 2012-09-11 ENCOUNTER — Other Ambulatory Visit: Payer: Self-pay | Admitting: Physician Assistant

## 2012-12-08 ENCOUNTER — Other Ambulatory Visit: Payer: Self-pay | Admitting: Physician Assistant

## 2013-05-15 ENCOUNTER — Telehealth: Payer: Self-pay | Admitting: Cardiovascular Disease

## 2014-01-06 NOTE — Telephone Encounter (Signed)
error 

## 2014-08-26 ENCOUNTER — Encounter (HOSPITAL_COMMUNITY): Payer: Self-pay | Admitting: Cardiovascular Disease

## 2019-11-28 ENCOUNTER — Ambulatory Visit: Payer: Medicare Other | Attending: Internal Medicine

## 2019-11-28 DIAGNOSIS — Z23 Encounter for immunization: Secondary | ICD-10-CM

## 2019-11-28 NOTE — Progress Notes (Signed)
   Covid-19 Vaccination Clinic  Name:  Jorge Fisher    MRN: [id number] DOB: 08/15/1956  11/28/2019  Jorge Fisher was observed post Covid-19 immunization for 30 minutes based on pre-vaccination screening without incident. He was provided with Vaccine Information Sheet and instruction to access the V-Safe system.   Jorge Fisher was instructed to call 911 with any severe reactions post vaccine: Marland Kitchen Difficulty breathing  . Swelling of face and throat  . A fast heartbeat  . A bad rash all over body  . Dizziness and weakness   Immunizations Administered    Name Date Dose VIS Date Route   Pfizer COVID-19 Vaccine 11/28/2019 12:41 PM 0.3 mL 08/28/2019 Intramuscular   Manufacturer: [place]   Lot: [id number]   [place]: [id number]

## 2019-12-21 ENCOUNTER — Ambulatory Visit: Payer: Medicare Other | Attending: Internal Medicine

## 2019-12-21 ENCOUNTER — Ambulatory Visit: Payer: Medicare Other

## 2019-12-21 DIAGNOSIS — Z23 Encounter for immunization: Secondary | ICD-10-CM

## 2019-12-21 NOTE — Progress Notes (Signed)
   Covid-19 Vaccination Clinic  Name:  Jorge Fisher    MRN: [id number] DOB: [birth year]  12/21/2019  [name] was observed post Covid-19 immunization for 15 minutes without incident. He was provided with Vaccine Information Sheet and instruction to access the V-Safe system.   [name] was instructed to call 911 with any severe reactions post vaccine: Marland Kitchen Difficulty breathing  . Swelling of face and throat  . A fast heartbeat  . A bad rash all over body  . Dizziness and weakness   Immunizations Administered    Name Date Dose VIS Date Route   Pfizer COVID-19 Vaccine 12/21/2019  3:27 PM 0.3 mL 08/28/2019 Intramuscular   Manufacturer: [place]   Lot: [id number]   [place]: [id number]

## 2021-02-09 ENCOUNTER — Other Ambulatory Visit: Payer: Self-pay

## 2021-02-09 ENCOUNTER — Ambulatory Visit: Payer: 59 | Attending: Internal Medicine

## 2021-02-09 DIAGNOSIS — Z23 Encounter for immunization: Secondary | ICD-10-CM

## 2021-02-09 NOTE — Progress Notes (Signed)
   Covid-19 Vaccination Clinic  Name:  Jorge Fisher    MRN: [id number] DOB: October 17, 1955  02/09/2021  Jorge Fisher was observed post Covid-19 immunization for 15 minutes without incident. He was provided with Vaccine Information Sheet and instruction to access the V-Safe system.   Jorge Fisher was instructed to call 911 with any severe reactions post vaccine: Marland Kitchen Difficulty breathing  . Swelling of face and throat  . A fast heartbeat  . A bad rash all over body  . Dizziness and weakness   Immunizations Administered    Name Date Dose VIS Date Route   PFIZER Comrnaty(Gray TOP) Covid-19 Vaccine 02/09/2021 10:31 AM 0.3 mL 08/25/2020 Intramuscular   Manufacturer: [employer]   Lot: [id number]   [place]: [id number]

## 2021-10-20 ENCOUNTER — Other Ambulatory Visit: Payer: Self-pay

## 2021-10-20 ENCOUNTER — Inpatient Hospital Stay (HOSPITAL_COMMUNITY)
Admission: EM | Admit: 2021-10-20 | Discharge: 2021-11-01 | DRG: 234 | Disposition: A | Discharge status: Home / Self Care | Payer: 59 | Attending: Cardiothoracic Surgery | Admitting: Cardiothoracic Surgery

## 2021-10-20 ENCOUNTER — Encounter (HOSPITAL_COMMUNITY): Payer: Self-pay | Admitting: Emergency Medicine

## 2021-10-20 DIAGNOSIS — Z91018 Allergy to other foods: Secondary | ICD-10-CM

## 2021-10-20 DIAGNOSIS — I4510 Unspecified right bundle-branch block: Secondary | ICD-10-CM | POA: Diagnosis present

## 2021-10-20 DIAGNOSIS — Z20822 Contact with and (suspected) exposure to covid-19: Secondary | ICD-10-CM | POA: Diagnosis present

## 2021-10-20 DIAGNOSIS — I255 Ischemic cardiomyopathy: Secondary | ICD-10-CM | POA: Diagnosis present

## 2021-10-20 DIAGNOSIS — E119 Type 2 diabetes mellitus without complications: Secondary | ICD-10-CM | POA: Diagnosis present

## 2021-10-20 DIAGNOSIS — I4891 Unspecified atrial fibrillation: Secondary | ICD-10-CM | POA: Diagnosis present

## 2021-10-20 DIAGNOSIS — J9811 Atelectasis: Secondary | ICD-10-CM | POA: Diagnosis present

## 2021-10-20 DIAGNOSIS — I5020 Unspecified systolic (congestive) heart failure: Secondary | ICD-10-CM | POA: Diagnosis present

## 2021-10-20 DIAGNOSIS — I2510 Atherosclerotic heart disease of native coronary artery without angina pectoris: Secondary | ICD-10-CM | POA: Diagnosis present

## 2021-10-20 DIAGNOSIS — Z8719 Personal history of other diseases of the digestive system: Secondary | ICD-10-CM

## 2021-10-20 DIAGNOSIS — I214 Non-ST elevation (NSTEMI) myocardial infarction: Secondary | ICD-10-CM | POA: Diagnosis not present

## 2021-10-20 DIAGNOSIS — Z4682 Encounter for fitting and adjustment of non-vascular catheter: Secondary | ICD-10-CM

## 2021-10-20 DIAGNOSIS — R0789 Other chest pain: Secondary | ICD-10-CM | POA: Diagnosis not present

## 2021-10-20 DIAGNOSIS — D62 Acute posthemorrhagic anemia: Secondary | ICD-10-CM | POA: Diagnosis not present

## 2021-10-20 DIAGNOSIS — Z951 Presence of aortocoronary bypass graft: Secondary | ICD-10-CM

## 2021-10-20 DIAGNOSIS — Z91010 Allergy to peanuts: Secondary | ICD-10-CM

## 2021-10-20 DIAGNOSIS — Z09 Encounter for follow-up examination after completed treatment for conditions other than malignant neoplasm: Secondary | ICD-10-CM

## 2021-10-20 DIAGNOSIS — I110 Hypertensive heart disease with heart failure: Secondary | ICD-10-CM | POA: Diagnosis present

## 2021-10-20 DIAGNOSIS — Z833 Family history of diabetes mellitus: Secondary | ICD-10-CM

## 2021-10-20 DIAGNOSIS — I252 Old myocardial infarction: Secondary | ICD-10-CM

## 2021-10-20 DIAGNOSIS — Z955 Presence of coronary angioplasty implant and graft: Secondary | ICD-10-CM

## 2021-10-20 DIAGNOSIS — I071 Rheumatic tricuspid insufficiency: Secondary | ICD-10-CM | POA: Diagnosis present

## 2021-10-20 DIAGNOSIS — Z79899 Other long term (current) drug therapy: Secondary | ICD-10-CM

## 2021-10-20 DIAGNOSIS — E782 Mixed hyperlipidemia: Secondary | ICD-10-CM | POA: Diagnosis present

## 2021-10-20 DIAGNOSIS — R7309 Other abnormal glucose: Secondary | ICD-10-CM

## 2021-10-20 DIAGNOSIS — Z8249 Family history of ischemic heart disease and other diseases of the circulatory system: Secondary | ICD-10-CM

## 2021-10-20 NOTE — ED Triage Notes (Signed)
Patient reports central chest pain with SOB and lightheaded onset yesterday , no emesis or diaphoresis , history of CAD/coronary stents.

## 2021-10-20 NOTE — ED Provider Triage Note (Signed)
Emergency Medicine Provider Triage Evaluation Note  Jorge Fisher , a 66 y.o. male  was evaluated in triage.  Pt complains of central chest pressure which has been intermittent x 2 days. Started tonight ~1.5 hours ago. Has been spontaneously improving. Tonight pressure started when at rest. Was not made worse with exertion. Did have a moment yesterday with his pain where ambulation caused some associated DOE. Notes associated lightheadedness/dizziness w/o syncope. Denies fever, leg swelling, cough, diaphoresis.  Hx of proximal LAD occlusion in 2013 s/p PTCA/DES x 2 proximal and mid LAD. Has not followed up with cardiology since this hospitalization. Does take 81mg  ASA daily.   Review of Systems  Positive: As above Negative: As above  Physical Exam  BP 129/85    Pulse (!) 53    Resp (!) 22    Ht 5\' 10"  (1.778 m)    Wt 90 kg    SpO2 99%    BMI 28.47 kg/m  Gen:   Awake, no distress   Resp:  Normal effort  MSK:   Moves extremities without difficulty  Other:  Lungs CTAB. No BLE edema.  Medical Decision Making  Medically screening exam initiated at 11:59 PM.  Appropriate orders placed.  Jorge Fisher was informed that the remainder of the evaluation will be completed by another provider, this initial triage assessment does not replace that evaluation, and the importance of remaining in the ED until their evaluation is complete.  Chest pain w/hx of ACS - RBBB on EKG pending CXR and labs. ASA ordered.   Antonietta Breach, PA-C 10/21/21 0011

## 2021-10-21 ENCOUNTER — Emergency Department (HOSPITAL_COMMUNITY): Payer: 59

## 2021-10-21 ENCOUNTER — Inpatient Hospital Stay (HOSPITAL_COMMUNITY): Payer: 59

## 2021-10-21 ENCOUNTER — Encounter (HOSPITAL_COMMUNITY): Payer: Self-pay | Admitting: Cardiology

## 2021-10-21 DIAGNOSIS — Z20822 Contact with and (suspected) exposure to covid-19: Secondary | ICD-10-CM | POA: Diagnosis not present

## 2021-10-21 DIAGNOSIS — Z833 Family history of diabetes mellitus: Secondary | ICD-10-CM | POA: Diagnosis not present

## 2021-10-21 DIAGNOSIS — I214 Non-ST elevation (NSTEMI) myocardial infarction: Secondary | ICD-10-CM | POA: Diagnosis not present

## 2021-10-21 DIAGNOSIS — E119 Type 2 diabetes mellitus without complications: Secondary | ICD-10-CM | POA: Diagnosis not present

## 2021-10-21 DIAGNOSIS — Z955 Presence of coronary angioplasty implant and graft: Secondary | ICD-10-CM | POA: Diagnosis not present

## 2021-10-21 DIAGNOSIS — Z91010 Allergy to peanuts: Secondary | ICD-10-CM | POA: Diagnosis not present

## 2021-10-21 DIAGNOSIS — I5021 Acute systolic (congestive) heart failure: Secondary | ICD-10-CM | POA: Diagnosis not present

## 2021-10-21 DIAGNOSIS — Z91018 Allergy to other foods: Secondary | ICD-10-CM | POA: Diagnosis not present

## 2021-10-21 DIAGNOSIS — I4510 Unspecified right bundle-branch block: Secondary | ICD-10-CM | POA: Diagnosis present

## 2021-10-21 DIAGNOSIS — I4891 Unspecified atrial fibrillation: Secondary | ICD-10-CM | POA: Diagnosis present

## 2021-10-21 DIAGNOSIS — E782 Mixed hyperlipidemia: Secondary | ICD-10-CM | POA: Diagnosis not present

## 2021-10-21 DIAGNOSIS — I2510 Atherosclerotic heart disease of native coronary artery without angina pectoris: Secondary | ICD-10-CM | POA: Diagnosis not present

## 2021-10-21 DIAGNOSIS — R0789 Other chest pain: Secondary | ICD-10-CM | POA: Diagnosis present

## 2021-10-21 DIAGNOSIS — I071 Rheumatic tricuspid insufficiency: Secondary | ICD-10-CM | POA: Diagnosis not present

## 2021-10-21 DIAGNOSIS — R7309 Other abnormal glucose: Secondary | ICD-10-CM | POA: Insufficient documentation

## 2021-10-21 DIAGNOSIS — Z8249 Family history of ischemic heart disease and other diseases of the circulatory system: Secondary | ICD-10-CM | POA: Diagnosis not present

## 2021-10-21 DIAGNOSIS — Z8719 Personal history of other diseases of the digestive system: Secondary | ICD-10-CM | POA: Diagnosis not present

## 2021-10-21 DIAGNOSIS — D62 Acute posthemorrhagic anemia: Secondary | ICD-10-CM | POA: Diagnosis not present

## 2021-10-21 DIAGNOSIS — I25110 Atherosclerotic heart disease of native coronary artery with unstable angina pectoris: Secondary | ICD-10-CM | POA: Diagnosis not present

## 2021-10-21 DIAGNOSIS — M7989 Other specified soft tissue disorders: Secondary | ICD-10-CM | POA: Diagnosis not present

## 2021-10-21 DIAGNOSIS — I5020 Unspecified systolic (congestive) heart failure: Secondary | ICD-10-CM | POA: Diagnosis not present

## 2021-10-21 DIAGNOSIS — J9811 Atelectasis: Secondary | ICD-10-CM | POA: Diagnosis not present

## 2021-10-21 DIAGNOSIS — I252 Old myocardial infarction: Secondary | ICD-10-CM | POA: Diagnosis not present

## 2021-10-21 DIAGNOSIS — Z79899 Other long term (current) drug therapy: Secondary | ICD-10-CM | POA: Diagnosis not present

## 2021-10-21 DIAGNOSIS — I10 Essential (primary) hypertension: Secondary | ICD-10-CM | POA: Diagnosis not present

## 2021-10-21 DIAGNOSIS — Z01810 Encounter for preprocedural cardiovascular examination: Secondary | ICD-10-CM | POA: Diagnosis not present

## 2021-10-21 DIAGNOSIS — I110 Hypertensive heart disease with heart failure: Secondary | ICD-10-CM | POA: Diagnosis not present

## 2021-10-21 DIAGNOSIS — I255 Ischemic cardiomyopathy: Secondary | ICD-10-CM | POA: Diagnosis not present

## 2021-10-21 LAB — CBC
HCT: 43.3 % (ref 39.0–52.0)
Hemoglobin: 14.7 g/dL (ref 13.0–17.0)
MCH: 32.6 pg (ref 26.0–34.0)
MCHC: 33.9 g/dL (ref 30.0–36.0)
MCV: 96 fL (ref 80.0–100.0)
Platelets: 182 10*3/uL (ref 150–400)
RBC: 4.51 MIL/uL (ref 4.22–5.81)
RDW: 12.2 % (ref 11.5–15.5)
WBC: 6.5 10*3/uL (ref 4.0–10.5)
nRBC: 0 % (ref 0.0–0.2)

## 2021-10-21 LAB — LIPID PANEL
Cholesterol: 168 mg/dL (ref 0–200)
HDL: 49 mg/dL (ref >40)
LDL Cholesterol: 111 mg/dL — ABNORMAL HIGH (ref 0–99)
Total CHOL/HDL Ratio: 3.4 RATIO
Triglycerides: 40 mg/dL (ref <150)
VLDL: 8 mg/dL (ref 0–40)

## 2021-10-21 LAB — HEPARIN LEVEL (UNFRACTIONATED): Heparin Unfractionated: 0.37 IU/mL (ref 0.30–0.70)

## 2021-10-21 LAB — TROPONIN I (HIGH SENSITIVITY)
Troponin I (High Sensitivity): 102 ng/L (ref <18)
Troponin I (High Sensitivity): 7623 ng/L (ref <18)
Troponin I (High Sensitivity): 878 ng/L (ref <18)

## 2021-10-21 LAB — ECHOCARDIOGRAM COMPLETE
Area-P 1/2: 2.83 cm2
Height: 70 in
S' Lateral: 3.8 cm
Weight: 2753.6 oz

## 2021-10-21 LAB — HEMOGLOBIN A1C
Hgb A1c MFr Bld: 5.6 % (ref 4.8–5.6)
Mean Plasma Glucose: 114.02 mg/dL

## 2021-10-21 LAB — BASIC METABOLIC PANEL
Anion gap: 8 (ref 5–15)
BUN: 12 mg/dL (ref 8–23)
CO2: 28 mmol/L (ref 22–32)
Calcium: 9.4 mg/dL (ref 8.9–10.3)
Chloride: 101 mmol/L (ref 98–111)
Creatinine, Ser: 1.12 mg/dL (ref 0.61–1.24)
GFR, Estimated: >60 mL/min (ref >60)
Glucose, Bld: 177 mg/dL — ABNORMAL HIGH (ref 70–99)
Potassium: 3.9 mmol/L (ref 3.5–5.1)
Sodium: 137 mmol/L (ref 135–145)

## 2021-10-21 LAB — RESP PANEL BY RT-PCR (FLU A&B, COVID) ARPGX2
Influenza A by PCR: NEGATIVE
Influenza B by PCR: NEGATIVE
SARS Coronavirus 2 by RT PCR: NEGATIVE

## 2021-10-21 LAB — TSH: TSH: 0.335 u[IU]/mL — ABNORMAL LOW (ref 0.350–4.500)

## 2021-10-21 LAB — HIV ANTIBODY (ROUTINE TESTING W REFLEX): HIV Screen 4th Generation wRfx: NONREACTIVE

## 2021-10-21 MED ORDER — NITROGLYCERIN 0.4 MG SL SUBL: 0.4000 mg | SUBLINGUAL_TABLET | SUBLINGUAL | Status: DC | PRN

## 2021-10-21 MED ORDER — HEPARIN (PORCINE) 25000 UT/250ML-% IV SOLN
1300.0000 [IU]/h | INTRAVENOUS | Status: DC
  Administered 2021-10-21 – 2021-10-23 (×4): 1300 [IU]/h via INTRAVENOUS
  Filled 2021-10-21 (×4): qty 250

## 2021-10-21 MED ORDER — ASPIRIN 81 MG PO CHEW: 324.0000 mg | CHEWABLE_TABLET | Freq: Once | ORAL | Status: DC

## 2021-10-21 MED ORDER — METOPROLOL TARTRATE 25 MG PO TABS
25.0000 mg | ORAL_TABLET | Freq: Two times a day (BID) | ORAL | Status: DC
  Administered 2021-10-21 (×2): 25 mg via ORAL
  Filled 2021-10-21 (×4): qty 1

## 2021-10-21 MED ORDER — ACETAMINOPHEN 325 MG PO TABS: 650.0000 mg | ORAL_TABLET | ORAL | Status: DC | PRN

## 2021-10-21 MED ORDER — ASPIRIN EC 81 MG PO TBEC
81.0000 mg | DELAYED_RELEASE_TABLET | Freq: Every day | ORAL | Status: DC
  Administered 2021-10-22 – 2021-10-25 (×3): 81 mg via ORAL
  Filled 2021-10-21 (×3): qty 1

## 2021-10-21 MED ORDER — ONDANSETRON HCL 4 MG/2ML IJ SOLN: 4.0000 mg | Freq: Four times a day (QID) | INTRAMUSCULAR | Status: DC | PRN

## 2021-10-21 MED ORDER — HEPARIN BOLUS VIA INFUSION
4000.0000 [IU] | Freq: Once | INTRAVENOUS | Status: AC
  Administered 2021-10-21: 4000 [IU] via INTRAVENOUS
  Filled 2021-10-21: qty 4000

## 2021-10-21 MED ORDER — ATORVASTATIN CALCIUM 80 MG PO TABS
80.0000 mg | ORAL_TABLET | Freq: Every day | ORAL | Status: DC
  Administered 2021-10-21 – 2021-11-01 (×11): 80 mg via ORAL
  Filled 2021-10-21 (×10): qty 1
  Filled 2021-10-21: qty 2

## 2021-10-21 MED ORDER — ASPIRIN 81 MG PO CHEW
324.0000 mg | CHEWABLE_TABLET | Freq: Once | ORAL | Status: AC
  Administered 2021-10-21: 324 mg via ORAL
  Filled 2021-10-21: qty 4

## 2021-10-21 NOTE — Progress Notes (Addendum)
ANTICOAGULATION CONSULT NOTE - Follow Up Consult  Pharmacy Consult for Heparin Indication: chest pain/ACS  Allergies  Allergen Reactions   Peanuts [Peanut Oil] Swelling    cashews    Patient Measurements: Height: 5\' 10"  (177.8 cm) Weight: 90 kg (198 lb 6.6 oz) IBW/kg (Calculated) : 73   Vital Signs: Temp: 98.9 F (37.2 C) (02/04 0345) Temp Source: Oral (02/04 0345) BP: 111/70 (02/04 0900) Pulse Rate: 65 (02/04 0906)  Labs: Recent Labs    10/21/21 0002 10/21/21 0200 10/21/21 0715 10/21/21 1000  HGB 14.7  --   --   --   HCT 43.3  --   --   --   PLT 182  --   --   --   HEPARINUNFRC  --   --   --  0.37  CREATININE 1.12  --   --   --   TROPONINIHS 102* 878* 7,623*  --      Estimated Creatinine Clearance: 72.2 mL/min (by C-G formula based on SCr of 1.12 mg/dL).  Assessment: 66 year old male on IV heparin for ACS.   Initial HL is therapeutic at 0.37 on 1300 units/hr. H/H and Plt wnl.   Goal of Therapy:  Heparin level 0.3-0.7 units/ml Monitor platelets by anticoagulation protocol: Yes   Plan:  Continue heparin at 1300 units/hr  Daily heparin level, CBC  Albertina Parr, PharmD., BCCCP Clinical Pharmacist Please refer to [facility] for unit-specific pharmacist

## 2021-10-21 NOTE — ED Notes (Signed)
Cardiology paged.  

## 2021-10-21 NOTE — Progress Notes (Signed)
° °  Progress Note  Patient Name: Jorge Fisher Date of Encounter: 10/21/2021  Primary Cardiologist: Dr. Angelena Form (2013)  Patient admitted early morning by cardiology fellow with NSTEMI.  He is hemodynamically stable and chest pain-free at this time.  He has a history of anterior STEMI in May 2013 status post DES x2 to the proximal mid LAD followed by staged DES to the proximal circumflex.  LVEF by echocardiogram in May 2013 was 50 to 55% with apical hypokinesis.  High-sensitivity troponin I level has increased to 7623.  I personally reviewed his ECG which shows sinus rhythm with right bundle branch block, old anteroseptal infarct pattern, and nonspecific ST segment changes.  No contrast dye allergy.  Creatinine 1.12, hemoglobin 14.7, LDL 111.  Plan is for follow-up echocardiogram over the weekend and scheduled for diagnostic cardiac catheterization with eye toward revascularization options on Monday.  Continue aspirin, high-dose Lipitor, Lopressor, and IV heparin.  Risks and benefits discussed with the patient and he is in agreement to proceed.  Signed, Rozann Lesches, MD  10/21/2021, 1:39 PM

## 2021-10-21 NOTE — Progress Notes (Signed)
ANTICOAGULATION CONSULT NOTE - Follow Up Consult  Pharmacy Consult for Heparin Indication: chest pain/ACS  Allergies  Allergen Reactions   Peanuts [Peanut Oil] Swelling    cashews    Patient Measurements: Height: 5\' 10"  (177.8 cm) Weight: 90 kg (198 lb 6.6 oz) IBW/kg (Calculated) : 73   Vital Signs: BP: 116/76 (02/04 0115) Pulse Rate: 63 (02/04 0115)  Labs: Recent Labs    10/21/21 0002  HGB 14.7  HCT 43.3  PLT 182  CREATININE 1.12  TROPONINIHS 102*    Estimated Creatinine Clearance: 72.2 mL/min (by C-G formula based on SCr of 1.12 mg/dL).  Assessment: 66 year old male to begin heparin for ACS Scr stable No anti-coag meds prior to admission  Goal of Therapy:  Heparin level 0.3-0.7 units/ml Monitor platelets by anticoagulation protocol: Yes   Plan:  Heparin 4000 units iv bolus x 1 Heparin drip at 1300 units / hr Heparin level 6 hours after heparin begins Daily heparin level, CBC  Thank you Anette Guarneri, PharmD  10/21/2021,1:41 AM

## 2021-10-21 NOTE — H&P (Signed)
Cardiology Admission History and Physical:   Patient ID: Jorge Fisher MRN: [id number]; DOB: 02/11/1956   Admission date: 10/20/2021  PCP:  Pcp, No   [facility] Providers Cardiologist:  None        Chief Complaint:  chest pain  Patient Profile:   Jorge Fisher is a 66 y.o. male with PMH Ischemic cardiomyopathy, h/o anterolateral STEMI 2013 s/p PCI LAD, staged PCI to LCX, CTO RCA (L->R collaterals), HTN, HLD  who is being seen 10/21/2021 for the evaluation of chest pain/NSTEMI.Marland Kitchen  History of Present Illness:   Jorge Fisher is a 66 y.o. male with PMH Ischemic cardiomyopathy, h/o anterolateral STEMI 2013 s/p PCI LAD, staged PCI to LCX, CTO RCA (L->R collaterals), HTN, HLD  who is being seen 10/21/2021 for the evaluation of chest pain/NSTEMI..  Patient reports he started having chest pain/heaviness last night, severe episode lasting for 74mins, non radiating and also felt exertional dyspnea denies any URTIs, orthopnea, pnd, LE edema. He works as a Scientist, clinical (histocompatibility and immunogenetics) but has not seen a doc over several years. Does not have PCP. By the time he came to the ER his symptoms had already resolved In the ER - he got aspirin full dose, nitro x1, heparin gtt given elevated troponin  Prior cardiac work up: Echo 2013 Left ventricle: The cavity size was normal. Wall thickness  was normal. Systolic function was normal. The estimated  ejection fraction was in the range of 50% to 55%.  Mild  hypokinesis of the apical myocardium.    Cath:5/24/20131:11 AM No primary provider on file.   Procedure Performed:  Left Heart Catheterization Selective Coronary Angiography Left ventricular angiogram Distal aortogram PTCA/DES x 2 proximal and mid LAD Procedure: PTCA and stenting of the Left Circumflex   Indication: Staged PCI after Anterolateral STEMI. Critical residual LCx stenosis involving a large area of myocardium  Past Medical History:  Diagnosis Date   CAD (coronary artery disease)    a) diagnosed with  acute anterior STEMI with totally occluded LAD s/p PTCA/DES x 2 to prox and mid LAD 02/08/12, staged PTCA/DES to Cx 02/12/12, diagonal dz for med rx, & chronically occ RCA with collaterals.    Colorectal polyps    Hemorrhoids    Hypertension    Mild HTN in past, but more recently borderline low blood pressures   Ischemic cardiomyopathy    Improved - EF 25-30% by cath 02/08/12 then 50-55% by echo 02/11/12    Past Surgical History:  Procedure Laterality Date   LEFT HEART CATHETERIZATION WITH CORONARY ANGIOGRAM N/A 02/08/2012   Procedure: LEFT HEART CATHETERIZATION WITH CORONARY ANGIOGRAM;  Surgeon: Burnell Blanks, MD;  Location: [facility] CATH LAB;  Service: Cardiovascular;  Laterality: N/A;   None     PERCUTANEOUS CORONARY STENT INTERVENTION (PCI-S) N/A 02/08/2012   Procedure: PERCUTANEOUS CORONARY STENT INTERVENTION (PCI-S);  Surgeon: Burnell Blanks, MD;  Location: [facility] CATH LAB;  Service: Cardiovascular;  Laterality: N/A;   PERCUTANEOUS CORONARY STENT INTERVENTION (PCI-S) N/A 02/12/2012   Procedure: PERCUTANEOUS CORONARY STENT INTERVENTION (PCI-S);  Surgeon: Sherren Mocha, MD;  Location: [facility] CATH LAB;  Service: Cardiovascular;  Laterality: N/A;     Medications Prior to Admission: Prior to Admission medications   Medication Sig Start Date End Date Taking? Authorizing Provider  Ascorbic Acid (VITAMIN C) 1000 MG tablet Take 1,000 mg by mouth daily.      [provider]  atorvastatin (LIPITOR) 80 MG tablet Take 1 tablet (80 mg total) by mouth at bedtime. 09/04/12 09/04/13  Burnell Blanks, MD  cholecalciferol (VITAMIN D) 1000 UNITS tablet Take 1,000 Units by mouth every other day.    [provider]  Coenzyme Q10 (CO Q 10 PO) Take by mouth.    [provider]  EFFIENT 10 MG TABS TAKE ONE TABLET BY MOUTH EVERY DAY 09/11/12   Burnell Blanks, MD  Garlic 161 MG TABS Take by mouth. DAILY    [provider]  KRILL OIL PO Take by mouth. DAILY     [provider]  lisinopril (PRINIVIL,ZESTRIL) 2.5 MG tablet Take 1 tablet (2.5 mg total) by mouth daily. 02/13/12 02/12/13  Dunn, Nedra Hai, PA-C  MAGNESIUM PO Take 1 tablet by mouth daily.    [provider]  metoprolol tartrate (LOPRESSOR) 25 MG tablet TAKE ONE-HALF TABLET BY MOUTH TWICE DAILY 12/08/12   Burnell Blanks, MD  Multiple Vitamins-Minerals (MULTIVITAMIN WITH MINERALS) tablet Take 1 tablet by mouth daily.      [provider]  nitroGLYCERIN (NITROSTAT) 0.4 MG SL tablet Place 1 tablet (0.4 mg total) under the tongue every 5 (five) minutes x 3 doses as needed for chest pain. 02/13/12 02/12/13  Dunn, Nedra Hai, PA-C  Omega-3 Fatty Acids (FISH OIL) 1000 MG CAPS Take 1 capsule by mouth 2 (two) times daily.    [provider]  OVER THE COUNTER MEDICATION PLANT STERILES TO HELP LOWER CHOL    [provider]  OVER THE COUNTER MEDICATION [place]    [provider]     Allergies:    Allergies  Allergen Reactions   Peanuts [Peanut Oil] Swelling    cashews    Social History:   Social History   Socioeconomic History   Marital status: Single    Spouse name: Not on file   Number of children: Not on file   Years of education: Not on file   Highest education level: Not on file  Occupational History   Not on file  Tobacco Use   Smoking status: Never   Smokeless tobacco: Not on file  Substance and Sexual Activity   Alcohol use: No   Drug use: No   Sexual activity: Not on file  Other Topics Concern   Not on file  Social History Narrative   Not on file   Social Determinants of Health   Financial Resource Strain: Not on file  Food Insecurity: Not on file  Transportation Needs: Not on file  Physical Activity: Not on file  Stress: Not on file  Social Connections: Not on file  Intimate Partner Violence: Not on file    Family History:   The patient's family history includes Diabetes type II in his brother and  sister; Heart attack (age of onset: 23) in his brother; Heart disease (age of onset: 29) in his father.    ROS:  Please see the history of present illness.  All other ROS reviewed and negative.     Physical Exam/Data:   Vitals:   10/20/21 2330 10/20/21 2341 10/21/21 0100 10/21/21 0115  BP: 129/85  120/75 116/76  Pulse: (!) 53  64 63  Resp: (!) 22  17 16   SpO2: 99%  100% 97%  Weight:  90 kg    Height:  5\' 10"  (1.778 m)     No intake or output data in the 24 hours ending 10/21/21 0209 Last 3 Weights 10/20/2021 03/11/2012 02/13/2012  Weight (lbs) 198 lb 6.6 oz 186 lb 191 lb 5.8 oz  Weight (kg) 90 kg  84.369 kg 86.8 kg     Body mass index is 28.47 kg/m.  General:  Well nourished, well developed, in no acute distress HEENT: normal Neck: no JVD Vascular: No carotid bruits; Distal pulses 2+ bilaterally   Cardiac:  normal S1, S2; RRR; no murmur  Lungs:  clear to auscultation bilaterally, no wheezing, rhonchi or rales  Abd: soft, nontender, no hepatomegaly  Ext: no edema Musculoskeletal:  No deformities, BUE and BLE strength normal and equal Skin: warm and dry  Neuro:  CNs 2-12 intact, no focal abnormalities noted Psych:  Normal affect    EKG:  today RBBB, q waves in anterior/septal leads. Inferior and lateral leads ST depression. Prior EKG shows inferior posterior STEMI from 2013   Relevant CV Studies: As noted above  Laboratory Data:  High Sensitivity Troponin:   Recent Labs  Lab 10/21/21 0002  TROPONINIHS 102*      Chemistry Recent Labs  Lab 10/21/21 0002  NA 137  K 3.9  CL 101  CO2 28  GLUCOSE 177*  BUN 12  CREATININE 1.12  CALCIUM 9.4  GFRNONAA >60  ANIONGAP 8    No results for input(s): PROT, ALBUMIN, AST, ALT, ALKPHOS, BILITOT in the last 168 hours. Lipids No results for input(s): CHOL, TRIG, HDL, LABVLDL, LDLCALC, CHOLHDL in the last 168 hours. Hematology Recent Labs  Lab 10/21/21 0002  WBC 6.5  RBC 4.51  HGB 14.7  HCT 43.3  MCV 96.0  MCH 32.6   MCHC 33.9  RDW 12.2  PLT 182   Thyroid No results for input(s): TSH, FREET4 in the last 168 hours. BNPNo results for input(s): BNP, PROBNP in the last 168 hours.  DDimer No results for input(s): DDIMER in the last 168 hours.   Radiology/Studies:  DG Chest 2 View  Result Date: 10/21/2021 CLINICAL DATA:  Chest pain EXAM: CHEST - 2 VIEW COMPARISON:  02/08/2012 FINDINGS: The heart size and mediastinal contours are within normal limits. Both lungs are clear. The visualized skeletal structures are unremarkable. IMPRESSION: No active cardiopulmonary disease. Electronically Signed   By: Rolm Baptise M.D.   On: 10/21/2021 00:45     Assessment and Plan:   NSTEMI, timi score 6 HTN, HLD Ischemic Cardiomyopathy, h/o STEMI in 2013 s/p PCI mid LAD, staged PCI to LCX, CTO RCA with L-R collateralas  Plan: - admit to Cardiology - s/p aspirin full dose, continue aspirin 81mg , start metoprolol tart 25mg  BID, atorva 80mg  daily. - continue heparin gtt  - check labs, tsh, a1c, lipid panel - trend trops/ekg - ECHO in am - Plan for CATH on Monday am.  Full code.   Risk Assessment/Risk Scores:    TIMI Risk Score for Unstable Angina or Non-ST Elevation MI:   The patient's TIMI risk score is 6, which indicates a 41% risk of all cause mortality, new or recurrent myocardial infarction or need for urgent revascularization in the next 14 days.       Severity of Illness: The appropriate patient status for this patient is INPATIENT. Inpatient status is judged to be reasonable and necessary in order to provide the required intensity of service to ensure the patient's safety. The patient's presenting symptoms, physical exam findings, and initial radiographic and laboratory data in the context of their chronic comorbidities is felt to place them at high risk for further clinical deterioration. Furthermore, it is not anticipated that the patient will be medically stable for discharge from the hospital within 2  midnights of admission.   * I  certify that at the point of admission it is my clinical judgment that the patient will require inpatient hospital care spanning beyond 2 midnights from the point of admission due to high intensity of service, high risk for further deterioration and high frequency of surveillance required.*   For questions or updates, please contact [place] Please consult [email or web address] for contact info under     Signed, Renae Fickle, MD  10/21/2021 2:09 AM

## 2021-10-21 NOTE — ED Notes (Signed)
Secure chat sent to Dr. Gasper Sells to infrom of elevated troponin.

## 2021-10-21 NOTE — ED Provider Notes (Signed)
[facility] EMERGENCY DEPARTMENT Provider Note   CSN: [id number] Arrival date & time: 10/20/21  2326     History  Chief Complaint  Patient presents with   Chest Pain    Jorge Fisher is a 66 y.o. male.  The history is provided by the patient.  Chest Pain He has history of hypertension, coronary artery disease and comes in because of pressure feeling in his midsternal area which started yesterday.  Initially, it was intermittent, but it has been constant since about 11 AM.  It has been rated as severe as 10/10, currently he rates it at 3/10.  Nothing makes it better, nothing makes it worse.  He has not noted any worsening of symptoms with exertion.  There is associated dyspnea but no nausea or diaphoresis.  He thought it may have been some indigestion.  Of note, he is supposed to be taking aspirin daily but does not take it on a regular basis.  He is a non-smoker and denies history of diabetes, hyperlipidemia.   Home Medications Prior to Admission medications   Medication Sig Start Date End Date Taking? Authorizing Provider  Ascorbic Acid (VITAMIN C) 1000 MG tablet Take 1,000 mg by mouth daily.      [provider]  atorvastatin (LIPITOR) 80 MG tablet Take 1 tablet (80 mg total) by mouth at bedtime. 09/04/12 09/04/13  Burnell Blanks, MD  cholecalciferol (VITAMIN D) 1000 UNITS tablet Take 1,000 Units by mouth every other day.    [provider]  Coenzyme Q10 (CO Q 10 PO) Take by mouth.    [provider]  EFFIENT 10 MG TABS TAKE ONE TABLET BY MOUTH EVERY DAY 09/11/12   Burnell Blanks, MD  Garlic 329 MG TABS Take by mouth. DAILY    [provider]  KRILL OIL PO Take by mouth. DAILY    [provider]  lisinopril (PRINIVIL,ZESTRIL) 2.5 MG tablet Take 1 tablet (2.5 mg total) by mouth daily. 02/13/12 02/12/13  Dunn, Nedra Hai, PA-C  MAGNESIUM PO Take 1 tablet by mouth daily.    [provider]  metoprolol  tartrate (LOPRESSOR) 25 MG tablet TAKE ONE-HALF TABLET BY MOUTH TWICE DAILY 12/08/12   Burnell Blanks, MD  Multiple Vitamins-Minerals (MULTIVITAMIN WITH MINERALS) tablet Take 1 tablet by mouth daily.      [provider]  nitroGLYCERIN (NITROSTAT) 0.4 MG SL tablet Place 1 tablet (0.4 mg total) under the tongue every 5 (five) minutes x 3 doses as needed for chest pain. 02/13/12 02/12/13  Dunn, Nedra Hai, PA-C  Omega-3 Fatty Acids (FISH OIL) 1000 MG CAPS Take 1 capsule by mouth 2 (two) times daily.    [provider]  OVER THE COUNTER MEDICATION PLANT STERILES TO HELP LOWER CHOL    [provider]  OVER THE COUNTER MEDICATION [place]    [provider]      Allergies    Peanuts [peanut oil]    Review of Systems   Review of Systems  Cardiovascular:  Positive for chest pain.  All other systems reviewed and are negative.  Physical Exam Updated Vital Signs BP 116/76    Pulse 63    Resp 16    Ht 5\' 10"  (1.778 m)    Wt 90 kg    SpO2 97%    BMI 28.47 kg/m  Physical Exam Vitals and nursing note reviewed.  66 year old male, resting comfortably and in no acute distress. Vital signs are  normal. Oxygen saturation is 97%, which is normal. Head is normocephalic and atraumatic. PERRLA, EOMI. Oropharynx is clear. Neck is nontender and supple without adenopathy or JVD. Back is nontender and there is no CVA tenderness. Lungs are clear without rales, wheezes, or rhonchi. Chest is nontender. Heart has regular rate and rhythm without murmur. Abdomen is soft, flat, nontender. Extremities have no cyanosis or edema, full range of motion is present. Skin is warm and dry without rash. Neurologic: Mental status is normal, cranial nerves are intact, moves all extremities equally.  ED Results / Procedures / Treatments   Labs (all labs ordered are listed, but only abnormal results are displayed) Labs Reviewed  BASIC METABOLIC PANEL - Abnormal; Notable for the  following components:      Result Value   Glucose, Bld 177 (*)    All other components within normal limits  TROPONIN I (HIGH SENSITIVITY) - Abnormal; Notable for the following components:   Troponin I (High Sensitivity) 102 (*)    All other components within normal limits  TROPONIN I (HIGH SENSITIVITY) - Abnormal; Notable for the following components:   Troponin I (High Sensitivity) 878 (*)    All other components within normal limits  RESP PANEL BY RT-PCR (FLU A&B, COVID) ARPGX2  CBC  HEPARIN LEVEL (UNFRACTIONATED)    EKG EKG Interpretation  Date/Time:  Friday October 20 2021 23:50:11 EST Ventricular Rate:  60 PR Interval:  184 QRS Duration: 132 QT Interval:  428 QTC Calculation: 428 R Axis:   47 Text Interpretation: Normal sinus rhythm Right bundle branch block Anteroseptal infarct , age undetermined Abnormal ECG Nonspecific ST abnormality When compared with ECG of 13-Feb-2012 05:09, Right bundle branch block is now present T wave abnormality has improved Confirmed by Delora Fuel ([id number]) on 10/21/2021 1:25:58 AM  Radiology DG Chest 2 View  Result Date: 10/21/2021 CLINICAL DATA:  Chest pain EXAM: CHEST - 2 VIEW COMPARISON:  02/08/2012 FINDINGS: The heart size and mediastinal contours are within normal limits. Both lungs are clear. The visualized skeletal structures are unremarkable. IMPRESSION: No active cardiopulmonary disease. Electronically Signed   By: Rolm Baptise M.D.   On: 10/21/2021 00:45    Procedures Procedures  Cardiac monitor shows normal sinus rhythm per my interpretation.  Medications Ordered in ED Medications  nitroGLYCERIN (NITROSTAT) SL tablet 0.4 mg (has no administration in time range)  heparin ADULT infusion 100 units/mL (25000 units/24mL) (1,300 Units/hr Intravenous New Bag/Given 10/21/21 0214)  aspirin chewable tablet 324 mg (324 mg Oral Given 10/21/21 0011)  heparin bolus via infusion 4,000 Units (4,000 Units Intravenous Bolus from Bag 10/21/21 0215)     ED Course/ Medical Decision Making/ A&P                           Medical Decision Making Amount and/or Complexity of Data Reviewed Labs: ordered.  Risk Prescription drug management. Decision regarding hospitalization.   Chest discomfort very concerning for ACS.  Other diagnostic possibilities include GERD, esophageal spasm.  ECG shows right bundle branch block which is new.  There are some ST depressions in the inferior leads which have been present at last ECG in 2013.  Chest x-ray shows no active disease.  I have independently viewed the images, and agree with the radiologist's interpretation.  Labs are significant for mildly elevated troponin of 102.  This is confirmatory for ACS, delta troponin is pending.  Also, glucose is elevated at 177.  While this is partly attributable to  physiologic stress from ACS, it also probably reflects underlying type 2 diabetes.  He was given aspirin at triage, he is given nitroglycerin and started on heparin.  Old records are reviewed confirming STEMI in 2013.  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  Case is discussed with Dr. Rudi Rummage, on-call for cardiology, who agrees to admit the patient.  Repeat troponin has increased to 878, consistent with non-STEMI.  CRITICAL CARE Performed by: Delora Fuel Total critical care time: 40 minutes Critical care time was exclusive of separately billable procedures and treating other patients. Critical care was necessary to treat or prevent imminent or life-threatening deterioration. Critical care was time spent personally by me on the following activities: development of treatment plan with patient and/or surrogate as well as nursing, discussions with consultants, evaluation of patient's response to treatment, examination of patient, obtaining history from patient or surrogate, ordering and performing treatments and interventions, ordering and review of  laboratory studies, ordering and review of radiographic studies, pulse oximetry and re-evaluation of patient's condition.       Final Clinical Impression(s) / ED Diagnoses Final diagnoses:  Non-STEMI (non-ST elevated myocardial infarction) ([place])  Elevated random blood glucose level    Rx / DC Orders ED Discharge Orders     None         Delora Fuel, MD 91/79/15 [phone]

## 2021-10-21 NOTE — ED Notes (Signed)
ASA 324 not given , patient took ASAi prior to arrival .

## 2021-10-21 NOTE — ED Notes (Addendum)
CRITICAL VALUE STICKER  CRITICAL VALUE:Troponin  RECEIVER (on-site recipient of call):I. Shelisha Gautier  DATE & TIME NOTIFIED: 10/21/21 0415  MESSENGER : Standley Brooking, RN  MD NOTIFIED: Dr. Debara Pickett  TIME OF NOTIFICATION:10/21/21 9301  RESPONSE: Continue with current plan of care

## 2021-10-22 DIAGNOSIS — I214 Non-ST elevation (NSTEMI) myocardial infarction: Secondary | ICD-10-CM | POA: Diagnosis not present

## 2021-10-22 LAB — CBC
HCT: 36.9 % — ABNORMAL LOW (ref 39.0–52.0)
Hemoglobin: 12.4 g/dL — ABNORMAL LOW (ref 13.0–17.0)
MCH: 32.2 pg (ref 26.0–34.0)
MCHC: 33.6 g/dL (ref 30.0–36.0)
MCV: 95.8 fL (ref 80.0–100.0)
Platelets: 167 10*3/uL (ref 150–400)
RBC: 3.85 MIL/uL — ABNORMAL LOW (ref 4.22–5.81)
RDW: 12.7 % (ref 11.5–15.5)
WBC: 6 10*3/uL (ref 4.0–10.5)
nRBC: 0 % (ref 0.0–0.2)

## 2021-10-22 LAB — HEPARIN LEVEL (UNFRACTIONATED): Heparin Unfractionated: 0.57 IU/mL (ref 0.30–0.70)

## 2021-10-22 MED ORDER — ASPIRIN 81 MG PO CHEW
81.0000 mg | CHEWABLE_TABLET | ORAL | Status: AC
  Administered 2021-10-23: 81 mg via ORAL
  Filled 2021-10-22: qty 1

## 2021-10-22 MED ORDER — METOPROLOL TARTRATE 12.5 MG HALF TABLET
12.5000 mg | ORAL_TABLET | Freq: Two times a day (BID) | ORAL | Status: DC
  Administered 2021-10-22 – 2021-10-25 (×8): 12.5 mg via ORAL
  Filled 2021-10-22 (×7): qty 1

## 2021-10-22 MED ORDER — SODIUM CHLORIDE 0.9 % WEIGHT BASED INFUSION: 1.0000 mL/kg/h | INTRAVENOUS | Status: DC

## 2021-10-22 MED ORDER — SODIUM CHLORIDE 0.9% FLUSH: 3.0000 mL | Freq: Two times a day (BID) | INTRAVENOUS | Status: DC

## 2021-10-22 MED ORDER — SODIUM CHLORIDE 0.9% FLUSH: 3.0000 mL | INTRAVENOUS | Status: DC | PRN

## 2021-10-22 MED ORDER — SODIUM CHLORIDE 0.9 % IV SOLN: 250.0000 mL | INTRAVENOUS | Status: DC | PRN

## 2021-10-22 MED ORDER — SODIUM CHLORIDE 0.9 % WEIGHT BASED INFUSION: 3.0000 mL/kg/h | INTRAVENOUS | Status: DC

## 2021-10-22 NOTE — Progress Notes (Signed)
ANTICOAGULATION CONSULT NOTE - Follow Up Consult  Pharmacy Consult for Heparin Indication: chest pain/ACS  Allergies  Allergen Reactions   Peanuts [Peanut Oil] Swelling    cashews    Patient Measurements: Height: 5\' 10"  (177.8 cm) Weight: 78.1 kg (172 lb 1.6 oz) IBW/kg (Calculated) : 73   Vital Signs: Temp: 97.8 F (36.6 C) (02/05 0414) Temp Source: Oral (02/05 0414) BP: 108/61 (02/05 0955) Pulse Rate: 76 (02/05 0955)  Labs: Recent Labs    10/21/21 0002 10/21/21 0200 10/21/21 0715 10/21/21 1000 10/22/21 0222  HGB 14.7  --   --   --  12.4*  HCT 43.3  --   --   --  36.9*  PLT 182  --   --   --  167  HEPARINUNFRC  --   --   --  0.37 0.57  CREATININE 1.12  --   --   --   --   TROPONINIHS 102* 878* 7,623*  --   --      Estimated Creatinine Clearance: 66.1 mL/min (by C-G formula based on SCr of 1.12 mg/dL).  Assessment: 66 year old male on IV heparin for ACS.   Heparin level continues to be therapeutic at 0.57. CBC stable. No issues noted per chart review. Of note, cath planned for Monday morning.   Goal of Therapy:  Heparin level 0.3-0.7 units/ml Monitor platelets by anticoagulation protocol: Yes   Plan:  Continue heparin at 1300 units/hr  Daily heparin level, CBC  Thank you for including pharmacy in the care of this patient.  Zenaida Deed, PharmD PGY1 Acute Care Pharmacy Resident  Phone: [phone] 10/22/2021  10:02 AM  Please check [email or web address] for unit-specific pharmacy phone numbers.

## 2021-10-22 NOTE — Progress Notes (Signed)
Upon reviewing consent form for Cardiac Cath scheduled for tomorrow. Pt states he was under the impression there were more testing to be done before proceeding with cath. Pt wishes to speak to MD prior to signing consent.

## 2021-10-22 NOTE — TOC Progression Note (Signed)
Transition of Care [facility]) - Progression Note    Patient Details  Name: Jorge Fisher MRN: [id number] Date of Birth: 23-Jul-1956  Transition of Care [facility]) CM/SW Contact  Zenon Mayo, RN Phone Number: 10/22/2021, 8:47 AM  Clinical Narrative:     Transition of Care [facility]) Screening Note   Patient Details  Name: Jorge Fisher Date of Birth: [birth year]   Transition of Care [facility]) CM/SW Contact:    Zenon Mayo, RN Phone Number: 10/22/2021, 8:47 AM    Transition of Care Department [facility]) has reviewed patient and no TOC needs have been identified at this time. We will continue to monitor patient advancement through interdisciplinary progression rounds. If new patient transition needs arise, please place a TOC consult.          Expected Discharge Plan and Services                                                 Social Determinants of Health (SDOH) Interventions    Readmission Risk Interventions No flowsheet data found.

## 2021-10-22 NOTE — Progress Notes (Signed)
Progress Note  Patient Name: Jorge Fisher Date of Encounter: 10/22/2021  Primary Cardiologist: Dr. Angelena Form (2013)  Subjective   No chest pain or shortness of breath.  Asymptomatic nocturnal bradycardia noted by telemetry.  Inpatient Medications    Scheduled Meds:  aspirin EC  81 mg Oral Daily   atorvastatin  80 mg Oral Daily   metoprolol tartrate  12.5 mg Oral BID   Continuous Infusions:  heparin 1,300 Units/hr (10/21/21 1837)   PRN Meds: acetaminophen, nitroGLYCERIN, ondansetron (ZOFRAN) IV   Vital Signs    Vitals:   10/21/21 2034 10/22/21 0044 10/22/21 0414 10/22/21 0955  BP: 100/60 (!) 94/59 (!) 98/54 108/61  Pulse: 61 (!) 55 63 76  Resp: 17 15 15    Temp: 98.1 F (36.7 C) 98.2 F (36.8 C) 97.8 F (36.6 C)   TempSrc: Oral Oral Oral   SpO2: 99% 99% 97%   Weight:      Height:        Intake/Output Summary (Last 24 hours) at 10/22/2021 1023 Last data filed at 10/21/2021 1259 Gross per 24 hour  Intake 177.97 ml  Output --  Net 177.97 ml   Filed Weights   10/20/21 2341 10/21/21 1215  Weight: 90 kg 78.1 kg    Telemetry    Sinus rhythm, sinus bradycardia overnight.  Personally reviewed.  ECG    An ECG dated 10/22/2021 was personally reviewed today and demonstrated:  Sinus bradycardia with right bundle branch block, old anteroseptal infarct pattern, lateral T wave inversions.  Physical Exam   GEN: No acute distress.   Neck: No JVD. Cardiac: RRR, no murmur, rub, or gallop.  Respiratory: Nonlabored. Clear to auscultation bilaterally. GI: Soft, nontender, bowel sounds present. MS: No edema; No deformity. Neuro:  Nonfocal. Psych: Alert and oriented x 3. Normal affect.  Labs    Chemistry Recent Labs  Lab 10/21/21 0002  NA 137  K 3.9  CL 101  CO2 28  GLUCOSE 177*  BUN 12  CREATININE 1.12  CALCIUM 9.4  GFRNONAA >60  ANIONGAP 8     Hematology Recent Labs  Lab 10/21/21 0002 10/22/21 0222  WBC 6.5 6.0  RBC 4.51 3.85*  HGB 14.7 12.4*  HCT  43.3 36.9*  MCV 96.0 95.8  MCH 32.6 32.2  MCHC 33.9 33.6  RDW 12.2 12.7  PLT 182 167    Cardiac Enzymes Recent Labs  Lab 10/21/21 0002 10/21/21 0200 10/21/21 0715  TROPONINIHS 102* 878* 7,623*    Radiology    DG Chest 2 View  Result Date: 10/21/2021 CLINICAL DATA:  Chest pain EXAM: CHEST - 2 VIEW COMPARISON:  02/08/2012 FINDINGS: The heart size and mediastinal contours are within normal limits. Both lungs are clear. The visualized skeletal structures are unremarkable. IMPRESSION: No active cardiopulmonary disease. Electronically Signed   By: Rolm Baptise M.D.   On: 10/21/2021 00:45   ECHOCARDIOGRAM COMPLETE  Result Date: 10/21/2021    ECHOCARDIOGRAM REPORT   Patient Name:   BENNET KUJAWA Date of Exam: 10/21/2021 Medical Rec #:  [id number]      Height:       70.0 in Accession #:    [id number]     Weight:       172.1 lb Date of Birth:  1956-07-02      BSA:          1.958 m Patient Age:    66 years       BP:  102/71 mmHg Patient Gender: M              HR:           62 bpm. Exam Location:  Inpatient Procedure: 2D Echo, Cardiac Doppler and Color Doppler Indications:    NSTEMI I21.4  History:        Patient has prior history of Echocardiogram examinations, most                 recent 02/11/2012. Previous Myocardial Infarction and CAD.  Sonographer:    Merrie Roof RDCS Referring Phys: [id number] [place]  1. Left ventricular ejection fraction, by estimation, is 40 to 45%. The left ventricle has mildly decreased function. The left ventricle demonstrates regional wall motion abnormalities (see scoring diagram/findings for description). There is mild left ventricular hypertrophy. Left ventricular diastolic parameters are indeterminate.  2. Right ventricular systolic function is normal. The right ventricular size is normal. Tricuspid regurgitation signal is inadequate for assessing PA pressure.  3. The mitral valve is grossly normal. Trivial mitral valve regurgitation.  4. The  aortic valve is tricuspid. Aortic valve regurgitation is not visualized.  5. The inferior vena cava is dilated in size with >50% respiratory variability, suggesting right atrial pressure of 8 mmHg. Comparison(s): Prior images unable to be directly viewed. FINDINGS  Left Ventricle: Left ventricular ejection fraction, by estimation, is 40 to 45%. The left ventricle has mildly decreased function. The left ventricle demonstrates regional wall motion abnormalities. The left ventricular internal cavity size was normal in size. There is mild left ventricular hypertrophy. Left ventricular diastolic parameters are indeterminate.  LV Wall Scoring: The mid and distal anterior wall, entire anterior septum, apical lateral segment, and apex are hypokinetic. The antero-lateral wall, entire inferior wall, posterior wall, mid inferoseptal segment, basal anterior segment, and basal inferoseptal segment are normal. Right Ventricle: The right ventricular size is normal. No increase in right ventricular wall thickness. Right ventricular systolic function is normal. Tricuspid regurgitation signal is inadequate for assessing PA pressure. Left Atrium: Left atrial size was normal in size. Right Atrium: Right atrial size was normal in size. Pericardium: There is no evidence of pericardial effusion. Mitral Valve: The mitral valve is grossly normal. Trivial mitral valve regurgitation. Tricuspid Valve: The tricuspid valve is grossly normal. Tricuspid valve regurgitation is trivial. Aortic Valve: The aortic valve is tricuspid. Aortic valve regurgitation is not visualized. Pulmonic Valve: The pulmonic valve was not well visualized. Pulmonic valve regurgitation is trivial. Aorta: The aortic root is normal in size and structure. Venous: The inferior vena cava is dilated in size with greater than 50% respiratory variability, suggesting right atrial pressure of 8 mmHg. IAS/Shunts: No atrial level shunt detected by color flow Doppler.  LEFT VENTRICLE  PLAX 2D LVIDd:         5.00 cm   Diastology LVIDs:         3.80 cm   LV e' medial:    6.53 cm/s LV PW:         1.10 cm   LV E/e' medial:  10.5 LV IVS:        0.80 cm   LV e' lateral:   6.85 cm/s LVOT diam:     2.00 cm   LV E/e' lateral: 10.0 LVOT Area:     3.14 cm  RIGHT VENTRICLE          IVC RV Basal diam:  3.50 cm  IVC diam: 2.10 cm LEFT ATRIUM  Index        RIGHT ATRIUM           Index LA diam:        2.60 cm 1.33 cm/m   RA Area:     16.40 cm LA Vol (A2C):   58.9 ml 30.08 ml/m  RA Volume:   44.00 ml  22.47 ml/m LA Vol (A4C):   41.4 ml 21.14 ml/m LA Biplane Vol: 50.2 ml 25.64 ml/m   AORTA Ao Root diam: 3.70 cm MITRAL VALVE MV Area (PHT): 2.83 cm    SHUNTS MV Decel Time: 268 msec    Systemic Diam: 2.00 cm MV E velocity: 68.70 cm/s MV A velocity: 93.30 cm/s MV E/A ratio:  0.74 Rozann Lesches MD Electronically signed by Rozann Lesches MD Signature Date/Time: 10/21/2021/3:53:49 PM    Final     Assessment & Plan    1.  NSTEMI, high-sensitivity troponin I up to 7623.  ECG with old anterior infarct pattern and lateral T wave inversions.  No recurrent chest pain otherwise hemodynamically stable.  Follow-up echocardiogram yesterday reveals LVEF 40 to 45% range with wall motion abnormalities consistent with ischemic cardiomyopathy.  2.  Essential hypertension, blood pressure is well controlled at this time.  3.  Mixed hyperlipidemia, LDL 111 He is now on high-dose Lipitor.  Patient is scheduled for a diagnostic cardiac catheterization tomorrow, risks and benefits discussed and orders written.  Continue aspirin, Lopressor with decrease to 12.5 mg twice daily, Lipitor, and IV heparin.  Signed, Rozann Lesches, MD  10/22/2021, 10:23 AM

## 2021-10-23 ENCOUNTER — Encounter (HOSPITAL_COMMUNITY)
Admission: EM | Disposition: A | Discharge status: Home / Self Care | Payer: Self-pay | Source: Home / Self Care | Attending: Cardiothoracic Surgery

## 2021-10-23 DIAGNOSIS — I2510 Atherosclerotic heart disease of native coronary artery without angina pectoris: Secondary | ICD-10-CM

## 2021-10-23 DIAGNOSIS — I10 Essential (primary) hypertension: Secondary | ICD-10-CM | POA: Diagnosis not present

## 2021-10-23 DIAGNOSIS — E782 Mixed hyperlipidemia: Secondary | ICD-10-CM | POA: Diagnosis not present

## 2021-10-23 DIAGNOSIS — I5021 Acute systolic (congestive) heart failure: Secondary | ICD-10-CM

## 2021-10-23 DIAGNOSIS — I214 Non-ST elevation (NSTEMI) myocardial infarction: Secondary | ICD-10-CM | POA: Diagnosis not present

## 2021-10-23 HISTORY — PX: LEFT HEART CATH AND CORONARY ANGIOGRAPHY: CATH118249

## 2021-10-23 LAB — BASIC METABOLIC PANEL
Anion gap: 8 (ref 5–15)
BUN: 9 mg/dL (ref 8–23)
CO2: 27 mmol/L (ref 22–32)
Calcium: 9.3 mg/dL (ref 8.9–10.3)
Chloride: 103 mmol/L (ref 98–111)
Creatinine, Ser: 1.17 mg/dL (ref 0.61–1.24)
GFR, Estimated: >60 mL/min (ref >60)
Glucose, Bld: 93 mg/dL (ref 70–99)
Potassium: 3.8 mmol/L (ref 3.5–5.1)
Sodium: 138 mmol/L (ref 135–145)

## 2021-10-23 LAB — CBC
HCT: 41 % (ref 39.0–52.0)
Hemoglobin: 13.9 g/dL (ref 13.0–17.0)
MCH: 32.3 pg (ref 26.0–34.0)
MCHC: 33.9 g/dL (ref 30.0–36.0)
MCV: 95.1 fL (ref 80.0–100.0)
Platelets: 187 10*3/uL (ref 150–400)
RBC: 4.31 MIL/uL (ref 4.22–5.81)
RDW: 12.5 % (ref 11.5–15.5)
WBC: 5.2 10*3/uL (ref 4.0–10.5)
nRBC: 0 % (ref 0.0–0.2)

## 2021-10-23 LAB — HEPARIN LEVEL (UNFRACTIONATED): Heparin Unfractionated: 0.55 IU/mL (ref 0.30–0.70)

## 2021-10-23 SURGERY — LEFT HEART CATH AND CORONARY ANGIOGRAPHY
Anesthesia: LOCAL

## 2021-10-23 MED ORDER — ACETAMINOPHEN 325 MG PO TABS: 650.0000 mg | ORAL_TABLET | ORAL | Status: DC | PRN

## 2021-10-23 MED ORDER — VERAPAMIL HCL 2.5 MG/ML IV SOLN
INTRAVENOUS | Status: AC
  Filled 2021-10-23: qty 2

## 2021-10-23 MED ORDER — HEPARIN (PORCINE) 25000 UT/250ML-% IV SOLN
1300.0000 [IU]/h | INTRAVENOUS | Status: DC
  Administered 2021-10-23 – 2021-10-25 (×3): 1300 [IU]/h via INTRAVENOUS
  Filled 2021-10-23 (×4): qty 250

## 2021-10-23 MED ORDER — SODIUM CHLORIDE 0.9 % IV SOLN: 250.0000 mL | INTRAVENOUS | Status: DC | PRN

## 2021-10-23 MED ORDER — MORPHINE SULFATE (PF) 2 MG/ML IV SOLN: 2.0000 mg | INTRAVENOUS | Status: DC | PRN

## 2021-10-23 MED ORDER — LIDOCAINE HCL (PF) 1 % IJ SOLN
INTRAMUSCULAR | Status: AC
  Filled 2021-10-23: qty 30

## 2021-10-23 MED ORDER — IOHEXOL 350 MG/ML SOLN
INTRAVENOUS | Status: DC | PRN
  Administered 2021-10-23: 50 mL via INTRA_ARTERIAL

## 2021-10-23 MED ORDER — LIDOCAINE HCL (PF) 1 % IJ SOLN
INTRAMUSCULAR | Status: DC | PRN
  Administered 2021-10-23: 2 mL

## 2021-10-23 MED ORDER — SODIUM CHLORIDE 0.9% FLUSH
3.0000 mL | Freq: Two times a day (BID) | INTRAVENOUS | Status: DC
  Administered 2021-10-23 – 2021-10-24 (×2): 3 mL via INTRAVENOUS

## 2021-10-23 MED ORDER — HEPARIN SODIUM (PORCINE) 1000 UNIT/ML IJ SOLN
INTRAMUSCULAR | Status: AC
  Filled 2021-10-23: qty 10

## 2021-10-23 MED ORDER — SODIUM CHLORIDE 0.9% FLUSH: 3.0000 mL | INTRAVENOUS | Status: DC | PRN

## 2021-10-23 MED ORDER — HEPARIN (PORCINE) IN NACL 1000-0.9 UT/500ML-% IV SOLN
INTRAVENOUS | Status: AC
  Filled 2021-10-23: qty 1000

## 2021-10-23 MED ORDER — HYDRALAZINE HCL 20 MG/ML IJ SOLN: 10.0000 mg | INTRAMUSCULAR | Status: AC | PRN

## 2021-10-23 MED ORDER — HEPARIN SODIUM (PORCINE) 1000 UNIT/ML IJ SOLN
INTRAMUSCULAR | Status: DC | PRN
  Administered 2021-10-23: 4000 [IU] via INTRAVENOUS

## 2021-10-23 MED ORDER — ASPIRIN 81 MG PO CHEW: 81.0000 mg | CHEWABLE_TABLET | Freq: Every day | ORAL | Status: DC

## 2021-10-23 MED ORDER — ONDANSETRON HCL 4 MG/2ML IJ SOLN: 4.0000 mg | Freq: Four times a day (QID) | INTRAMUSCULAR | Status: DC | PRN

## 2021-10-23 MED ORDER — VERAPAMIL HCL 2.5 MG/ML IV SOLN
INTRA_ARTERIAL | Status: DC | PRN
  Administered 2021-10-23: 15 mL via INTRA_ARTERIAL

## 2021-10-23 MED ORDER — HEPARIN (PORCINE) IN NACL 1000-0.9 UT/500ML-% IV SOLN
INTRAVENOUS | Status: DC | PRN
  Administered 2021-10-23 (×2): 500 mL

## 2021-10-23 MED ORDER — METOPROLOL TARTRATE 12.5 MG HALF TABLET: 12.5000 mg | ORAL_TABLET | Freq: Two times a day (BID) | ORAL | Status: DC

## 2021-10-23 MED ORDER — SODIUM CHLORIDE 0.9 % WEIGHT BASED INFUSION: 1.0000 mL/kg/h | INTRAVENOUS | Status: DC

## 2021-10-23 MED ORDER — ASPIRIN EC 81 MG PO TBEC: 81.0000 mg | DELAYED_RELEASE_TABLET | Freq: Every day | ORAL | Status: DC

## 2021-10-23 MED ORDER — SODIUM CHLORIDE 0.9 % IV SOLN: INTRAVENOUS | Status: AC

## 2021-10-23 MED ORDER — NITROGLYCERIN 1 MG/10 ML FOR IR/CATH LAB
INTRA_ARTERIAL | Status: AC
  Filled 2021-10-23: qty 10

## 2021-10-23 MED ORDER — LABETALOL HCL 5 MG/ML IV SOLN: 10.0000 mg | INTRAVENOUS | Status: AC | PRN

## 2021-10-23 MED ORDER — SODIUM CHLORIDE 0.9 % WEIGHT BASED INFUSION
3.0000 mL/kg/h | INTRAVENOUS | Status: DC
  Administered 2021-10-23: 3 mL/kg/h via INTRAVENOUS

## 2021-10-23 SURGICAL SUPPLY — 9 items

## 2021-10-23 NOTE — Progress Notes (Signed)
TCTS consulted for CABG evaluation. °

## 2021-10-23 NOTE — Progress Notes (Signed)
ANTICOAGULATION CONSULT NOTE - Follow Up Consult  Pharmacy Consult for Heparin Indication: chest pain/ACS  Allergies  Allergen Reactions   Peanuts [Peanut Oil] Swelling    cashews    Patient Measurements: Height: 5\' 10"  (177.8 cm) Weight: 78.1 kg (172 lb 1.6 oz) IBW/kg (Calculated) : 73   Vital Signs: Temp: 97.6 F (36.4 C) (02/06 1556) Temp Source: Oral (02/06 1556) BP: 118/72 (02/06 1556) Pulse Rate: 73 (02/06 1556)  Labs: Recent Labs    10/21/21 0002 10/21/21 0200 10/21/21 0715 10/21/21 1000 10/22/21 0222 10/23/21 0640  HGB 14.7  --   --   --  12.4* 13.9  HCT 43.3  --   --   --  36.9* 41.0  PLT 182  --   --   --  167 187  HEPARINUNFRC  --   --   --  0.37 0.57 0.55  CREATININE 1.12  --   --   --   --  1.17  TROPONINIHS 102* 878* 7,623*  --   --   --      Estimated Creatinine Clearance: 63.3 mL/min (by C-G formula based on SCr of 1.17 mg/dL).  Assessment: 66 year old male on IV heparin for ACS.   Pt is s/p cath with multivessel dz. Plan for CABG consult. Resume heparin 4 hr post sheath removal.   Goal of Therapy:  Heparin level 0.3-0.7 units/ml Monitor platelets by anticoagulation protocol: Yes   Plan:  Resume heparin heparin 1300 units/h at 8 PM tonight Daily heparin level and CBC  Onnie Boer, PharmD, BCIDP, AAHIVP, CPP Infectious Disease Pharmacist 10/23/2021 4:26 PM

## 2021-10-23 NOTE — Interval H&P Note (Signed)
Cath Lab Visit (complete for each Cath Lab visit)  Clinical Evaluation Leading to the Procedure:   ACS: Yes.    Non-ACS:    Anginal Classification: CCS II  Anti-ischemic medical therapy: Minimal Therapy (1 class of medications)  Non-Invasive Test Results: No non-invasive testing performed  Prior CABG: No previous CABG      History and Physical Interval Note:  10/23/2021 3:11 PM  Jorge Fisher  has presented today for surgery, with the diagnosis of NSTEMI.  The various methods of treatment have been discussed with the patient and family. After consideration of risks, benefits and other options for treatment, the patient has consented to  Procedure(s): LEFT HEART CATH AND CORONARY ANGIOGRAPHY (N/A) as a surgical intervention.  The patient's history has been reviewed, patient examined, no change in status, stable for surgery.  I have reviewed the patient's chart and labs.  Questions were answered to the patient's satisfaction.     Quay Burow

## 2021-10-23 NOTE — Progress Notes (Signed)
ANTICOAGULATION CONSULT NOTE - Follow Up Consult  Pharmacy Consult for Heparin Indication: chest pain/ACS  Allergies  Allergen Reactions   Peanuts [Peanut Oil] Swelling    cashews    Patient Measurements: Height: 5\' 10"  (177.8 cm) Weight: 78.1 kg (172 lb 1.6 oz) IBW/kg (Calculated) : 73   Vital Signs: Temp: 98.2 F (36.8 C) (02/06 0415) Temp Source: Oral (02/06 0415) BP: 137/72 (02/06 0415) Pulse Rate: 66 (02/06 0415)  Labs: Recent Labs    10/21/21 0002 10/21/21 0200 10/21/21 0715 10/21/21 1000 10/22/21 0222 10/23/21 0640  HGB 14.7  --   --   --  12.4* 13.9  HCT 43.3  --   --   --  36.9* 41.0  PLT 182  --   --   --  167 187  HEPARINUNFRC  --   --   --  0.37 0.57 0.55  CREATININE 1.12  --   --   --   --  1.17  TROPONINIHS 102* 878* 7,623*  --   --   --      Estimated Creatinine Clearance: 63.3 mL/min (by C-G formula based on SCr of 1.17 mg/dL).  Assessment: 66 year old male on IV heparin for ACS.   Heparin level therapeutic at 0.55, CBC stable, cath planned for today.  Goal of Therapy:  Heparin level 0.3-0.7 units/ml Monitor platelets by anticoagulation protocol: Yes   Plan:  Continue heparin 1300 units/h Daily heparin level and CBC  Arrie Senate, PharmD, [place], [facility] Clinical Pharmacist [phone] Please check AMION for all [facility] numbers 10/23/2021

## 2021-10-23 NOTE — H&P (View-Only) (Signed)
Progress Note  Patient Name: Jorge Fisher Date of Encounter: 10/23/2021  [facility] Cardiologist: Larae Grooms, MD (new)  Subjective   Denies chest pain, SOB, palpitations, dizziness. Able to move well from bed to chair independently. Does not have any questions about heart cath.   Inpatient Medications    Scheduled Meds:  aspirin  81 mg Oral Pre-Cath   aspirin EC  81 mg Oral Daily   atorvastatin  80 mg Oral Daily   metoprolol tartrate  12.5 mg Oral BID   sodium chloride flush  3 mL Intravenous Q12H   Continuous Infusions:  sodium chloride     sodium chloride     heparin 1,300 Units/hr (10/23/21 0606)   PRN Meds: sodium chloride, acetaminophen, nitroGLYCERIN, ondansetron (ZOFRAN) IV, sodium chloride flush   Vital Signs    Vitals:   10/22/21 0955 10/22/21 1458 10/22/21 2116 10/23/21 0415  BP: 108/61 116/69  137/72  Pulse: 76 78  66  Resp:  14 18 18   Temp:  98 F (36.7 C) 98.1 F (36.7 C) 98.2 F (36.8 C)  TempSrc:  Oral Oral Oral  SpO2:   96% 97%  Weight:      Height:        Intake/Output Summary (Last 24 hours) at 10/23/2021 0905 Last data filed at 10/23/2021 0606 Gross per 24 hour  Intake 529.33 ml  Output --  Net 529.33 ml   Last 3 Weights 10/21/2021 10/20/2021 03/11/2012  Weight (lbs) 172 lb 1.6 oz 198 lb 6.6 oz 186 lb  Weight (kg) 78.064 kg 90 kg 84.369 kg      Telemetry    Sinus rhythm, occasional PVCs. HR in 50s-60s, rare brief drops to the upper 40s - Personally Reviewed  ECG    Normal sinus rhythm, HR 66. RBBB. Inverted T waves in V1-V6, aVL - Personally Reviewed  Physical Exam   GEN: No acute distress. Sitting comfortably in the chair  Neck: No JVD Cardiac: RRR, no murmurs, rubs, or gallops. Vascular: Radial pulses 2+ bilaterally   Respiratory: Clear to auscultation bilaterally. GI: Soft, nontender, non-distended  MS: No edema; No deformity. Neuro:  Nonfocal. Alert and oriented x3   Psych: Normal affect   Labs    High  Sensitivity Troponin:   Recent Labs  Lab 10/21/21 0002 10/21/21 0200 10/21/21 0715  TROPONINIHS 102* 878* 7,623*     Chemistry Recent Labs  Lab 10/21/21 0002 10/23/21 0640  NA 137 138  K 3.9 3.8  CL 101 103  CO2 28 27  GLUCOSE 177* 93  BUN 12 9  CREATININE 1.12 1.17  CALCIUM 9.4 9.3  GFRNONAA >60 >60  ANIONGAP 8 8    Lipids  Recent Labs  Lab 10/21/21 0002  CHOL 168  TRIG 40  HDL 49  LDLCALC 111*  CHOLHDL 3.4    Hematology Recent Labs  Lab 10/21/21 0002 10/22/21 0222 10/23/21 0640  WBC 6.5 6.0 5.2  RBC 4.51 3.85* 4.31  HGB 14.7 12.4* 13.9  HCT 43.3 36.9* 41.0  MCV 96.0 95.8 95.1  MCH 32.6 32.2 32.3  MCHC 33.9 33.6 33.9  RDW 12.2 12.7 12.5  PLT 182 167 187   Thyroid  Recent Labs  Lab 10/21/21 0353  TSH 0.335*    BNPNo results for input(s): BNP, PROBNP in the last 168 hours.  DDimer No results for input(s): DDIMER in the last 168 hours.   Radiology    ECHOCARDIOGRAM COMPLETE  Result Date: 10/21/2021    ECHOCARDIOGRAM REPORT  Patient Name:   Jorge Fisher Date of Exam: 10/21/2021 Medical Rec #:  [id number]      Height:       70.0 in Accession #:    [id number]     Weight:       172.1 lb Date of Birth:  05-Feb-1956      BSA:          1.958 m Patient Age:    66 years       BP:           102/71 mmHg Patient Gender: M              HR:           62 bpm. Exam Location:  Inpatient Procedure: 2D Echo, Cardiac Doppler and Color Doppler Indications:    NSTEMI I21.4  History:        Patient has prior history of Echocardiogram examinations, most                 recent 02/11/2012. Previous Myocardial Infarction and CAD.  Sonographer:    Merrie Roof RDCS Referring Phys: [id number] [place]  1. Left ventricular ejection fraction, by estimation, is 40 to 45%. The left ventricle has mildly decreased function. The left ventricle demonstrates regional wall motion abnormalities (see scoring diagram/findings for description). There is mild left ventricular  hypertrophy. Left ventricular diastolic parameters are indeterminate.  2. Right ventricular systolic function is normal. The right ventricular size is normal. Tricuspid regurgitation signal is inadequate for assessing PA pressure.  3. The mitral valve is grossly normal. Trivial mitral valve regurgitation.  4. The aortic valve is tricuspid. Aortic valve regurgitation is not visualized.  5. The inferior vena cava is dilated in size with >50% respiratory variability, suggesting right atrial pressure of 8 mmHg. Comparison(s): Prior images unable to be directly viewed. FINDINGS  Left Ventricle: Left ventricular ejection fraction, by estimation, is 40 to 45%. The left ventricle has mildly decreased function. The left ventricle demonstrates regional wall motion abnormalities. The left ventricular internal cavity size was normal in size. There is mild left ventricular hypertrophy. Left ventricular diastolic parameters are indeterminate.  LV Wall Scoring: The mid and distal anterior wall, entire anterior septum, apical lateral segment, and apex are hypokinetic. The antero-lateral wall, entire inferior wall, posterior wall, mid inferoseptal segment, basal anterior segment, and basal inferoseptal segment are normal. Right Ventricle: The right ventricular size is normal. No increase in right ventricular wall thickness. Right ventricular systolic function is normal. Tricuspid regurgitation signal is inadequate for assessing PA pressure. Left Atrium: Left atrial size was normal in size. Right Atrium: Right atrial size was normal in size. Pericardium: There is no evidence of pericardial effusion. Mitral Valve: The mitral valve is grossly normal. Trivial mitral valve regurgitation. Tricuspid Valve: The tricuspid valve is grossly normal. Tricuspid valve regurgitation is trivial. Aortic Valve: The aortic valve is tricuspid. Aortic valve regurgitation is not visualized. Pulmonic Valve: The pulmonic valve was not well visualized.  Pulmonic valve regurgitation is trivial. Aorta: The aortic root is normal in size and structure. Venous: The inferior vena cava is dilated in size with greater than 50% respiratory variability, suggesting right atrial pressure of 8 mmHg. IAS/Shunts: No atrial level shunt detected by color flow Doppler.  LEFT VENTRICLE PLAX 2D LVIDd:         5.00 cm   Diastology LVIDs:         3.80 cm   LV e' medial:  6.53 cm/s LV PW:         1.10 cm   LV E/e' medial:  10.5 LV IVS:        0.80 cm   LV e' lateral:   6.85 cm/s LVOT diam:     2.00 cm   LV E/e' lateral: 10.0 LVOT Area:     3.14 cm  RIGHT VENTRICLE          IVC RV Basal diam:  3.50 cm  IVC diam: 2.10 cm LEFT ATRIUM             Index        RIGHT ATRIUM           Index LA diam:        2.60 cm 1.33 cm/m   RA Area:     16.40 cm LA Vol (A2C):   58.9 ml 30.08 ml/m  RA Volume:   44.00 ml  22.47 ml/m LA Vol (A4C):   41.4 ml 21.14 ml/m LA Biplane Vol: 50.2 ml 25.64 ml/m   AORTA Ao Root diam: 3.70 cm MITRAL VALVE MV Area (PHT): 2.83 cm    SHUNTS MV Decel Time: 268 msec    Systemic Diam: 2.00 cm MV E velocity: 68.70 cm/s MV A velocity: 93.30 cm/s MV E/A ratio:  0.74 Rozann Lesches MD Electronically signed by Rozann Lesches MD Signature Date/Time: 10/21/2021/3:53:49 PM    Final     Cardiac Studies   Echo 10/21/2021  1. Left ventricular ejection fraction, by estimation, is 40 to 45%. The  left ventricle has mildly decreased function. The left ventricle  demonstrates regional wall motion abnormalities (see scoring  diagram/findings for description). There is mild left  ventricular hypertrophy. Left ventricular diastolic parameters are  indeterminate.   2. Right ventricular systolic function is normal. The right ventricular  size is normal. Tricuspid regurgitation signal is inadequate for assessing  PA pressure.   3. The mitral valve is grossly normal. Trivial mitral valve  regurgitation.   4. The aortic valve is tricuspid. Aortic valve regurgitation is not   visualized.   5. The inferior vena cava is dilated in size with >50% respiratory  variability, suggesting right atrial pressure of 8 mmHg.   Comparison(s): Prior images unable to be directly viewed.   Patient Profile     66 y.o. male with PMH Ischemic cardiomyopathy, h/o anterolateral STEMI 2013 s/p PCI LAD, staged PCI to LCX, CTO RCA (L->R collaterals), HTN, HLD  who is being seen 10/21/2021 for the evaluation of chest pain/NSTEMI.  Assessment & Plan    NSTEMI  - HSTN peaked at [employer] on 2/4 - History of prior STEMI in 2013 with PCI to LAD (DESx2) and Circumflex (DESx1)  - EKG with old anterior infarct pattern, lateral T wave invesions  - No recurrent chest pain  - Echo this admission showed LVEF 40-45% with regional wall motion abnormalities, suspicious for ischemic cardiomyopathy  - Continue daily aspirin, statin - Metoprolol decreased to 12.5mg  BID due to bradycardia, low BP. BP and HR tolerating decreased dose well  - On IV heparin prior to cath   HFrEF  - Echo this admission showed LVEF 40-45% with regional wall motion abnormalities.   - Suspicious for ischemic cardiomyopathy - Euvolemic on exam  - Could possibly benefit from ARB, however not much BP room.  - On metoprolol 12.5mg  BID   HTN  - Blood pressure well controlled on current regiment   HLD  - On lipitor 80mg .  -  LDL 111, HDL 49 this admission   For questions or updates, please contact [place] Please consult [email or web address] for contact info under        Signed, Margie Billet, PA-C  10/23/2021, 9:05 AM     I have examined the patient and reviewed assessment and plan and discussed with patient.  Agree with above as stated.    Brisk right radial pulse.  All questions about cath answered.  Cath later today.  Patient is agreeable.  Titrate meds as BP allows.   LDL target is < 70 for this patient.    Larae Grooms

## 2021-10-23 NOTE — Progress Notes (Addendum)
Progress Note  Patient Name: Jorge Fisher Date of Encounter: 10/23/2021  [facility] Cardiologist: Larae Grooms, MD (new)  Subjective   Denies chest pain, SOB, palpitations, dizziness. Able to move well from bed to chair independently. Does not have any questions about heart cath.   Inpatient Medications    Scheduled Meds:  aspirin  81 mg Oral Pre-Cath   aspirin EC  81 mg Oral Daily   atorvastatin  80 mg Oral Daily   metoprolol tartrate  12.5 mg Oral BID   sodium chloride flush  3 mL Intravenous Q12H   Continuous Infusions:  sodium chloride     sodium chloride     heparin 1,300 Units/hr (10/23/21 0606)   PRN Meds: sodium chloride, acetaminophen, nitroGLYCERIN, ondansetron (ZOFRAN) IV, sodium chloride flush   Vital Signs    Vitals:   10/22/21 0955 10/22/21 1458 10/22/21 2116 10/23/21 0415  BP: 108/61 116/69  137/72  Pulse: 76 78  66  Resp:  14 18 18   Temp:  98 F (36.7 C) 98.1 F (36.7 C) 98.2 F (36.8 C)  TempSrc:  Oral Oral Oral  SpO2:   96% 97%  Weight:      Height:        Intake/Output Summary (Last 24 hours) at 10/23/2021 0905 Last data filed at 10/23/2021 0606 Gross per 24 hour  Intake 529.33 ml  Output --  Net 529.33 ml   Last 3 Weights 10/21/2021 10/20/2021 03/11/2012  Weight (lbs) 172 lb 1.6 oz 198 lb 6.6 oz 186 lb  Weight (kg) 78.064 kg 90 kg 84.369 kg      Telemetry    Sinus rhythm, occasional PVCs. HR in 50s-60s, rare brief drops to the upper 40s - Personally Reviewed  ECG    Normal sinus rhythm, HR 66. RBBB. Inverted T waves in V1-V6, aVL - Personally Reviewed  Physical Exam   GEN: No acute distress. Sitting comfortably in the chair  Neck: No JVD Cardiac: RRR, no murmurs, rubs, or gallops. Vascular: Radial pulses 2+ bilaterally   Respiratory: Clear to auscultation bilaterally. GI: Soft, nontender, non-distended  MS: No edema; No deformity. Neuro:  Nonfocal. Alert and oriented x3   Psych: Normal affect   Labs    High  Sensitivity Troponin:   Recent Labs  Lab 10/21/21 0002 10/21/21 0200 10/21/21 0715  TROPONINIHS 102* 878* 7,623*     Chemistry Recent Labs  Lab 10/21/21 0002 10/23/21 0640  NA 137 138  K 3.9 3.8  CL 101 103  CO2 28 27  GLUCOSE 177* 93  BUN 12 9  CREATININE 1.12 1.17  CALCIUM 9.4 9.3  GFRNONAA >60 >60  ANIONGAP 8 8    Lipids  Recent Labs  Lab 10/21/21 0002  CHOL 168  TRIG 40  HDL 49  LDLCALC 111*  CHOLHDL 3.4    Hematology Recent Labs  Lab 10/21/21 0002 10/22/21 0222 10/23/21 0640  WBC 6.5 6.0 5.2  RBC 4.51 3.85* 4.31  HGB 14.7 12.4* 13.9  HCT 43.3 36.9* 41.0  MCV 96.0 95.8 95.1  MCH 32.6 32.2 32.3  MCHC 33.9 33.6 33.9  RDW 12.2 12.7 12.5  PLT 182 167 187   Thyroid  Recent Labs  Lab 10/21/21 0353  TSH 0.335*    BNPNo results for input(s): BNP, PROBNP in the last 168 hours.  DDimer No results for input(s): DDIMER in the last 168 hours.   Radiology    ECHOCARDIOGRAM COMPLETE  Result Date: 10/21/2021    ECHOCARDIOGRAM REPORT  Patient Name:   Jorge Fisher Date of Exam: 10/21/2021 Medical Rec #:  [id number]      Height:       70.0 in Accession #:    [id number]     Weight:       172.1 lb Date of Birth:  [birth year]      BSA:          1.958 m Patient Age:    29 years       BP:           102/71 mmHg Patient Gender: M              HR:           62 bpm. Exam Location:  Inpatient Procedure: 2D Echo, Cardiac Doppler and Color Doppler Indications:    NSTEMI I21.4  History:        Patient has prior history of Echocardiogram examinations, most                 recent 02/11/2012. Previous Myocardial Infarction and CAD.  Sonographer:    Merrie Roof RDCS Referring Phys: [id number] [place]  1. Left ventricular ejection fraction, by estimation, is 40 to 45%. The left ventricle has mildly decreased function. The left ventricle demonstrates regional wall motion abnormalities (see scoring diagram/findings for description). There is mild left ventricular  hypertrophy. Left ventricular diastolic parameters are indeterminate.  2. Right ventricular systolic function is normal. The right ventricular size is normal. Tricuspid regurgitation signal is inadequate for assessing PA pressure.  3. The mitral valve is grossly normal. Trivial mitral valve regurgitation.  4. The aortic valve is tricuspid. Aortic valve regurgitation is not visualized.  5. The inferior vena cava is dilated in size with >50% respiratory variability, suggesting right atrial pressure of 8 mmHg. Comparison(s): Prior images unable to be directly viewed. FINDINGS  Left Ventricle: Left ventricular ejection fraction, by estimation, is 40 to 45%. The left ventricle has mildly decreased function. The left ventricle demonstrates regional wall motion abnormalities. The left ventricular internal cavity size was normal in size. There is mild left ventricular hypertrophy. Left ventricular diastolic parameters are indeterminate.  LV Wall Scoring: The mid and distal anterior wall, entire anterior septum, apical lateral segment, and apex are hypokinetic. The antero-lateral wall, entire inferior wall, posterior wall, mid inferoseptal segment, basal anterior segment, and basal inferoseptal segment are normal. Right Ventricle: The right ventricular size is normal. No increase in right ventricular wall thickness. Right ventricular systolic function is normal. Tricuspid regurgitation signal is inadequate for assessing PA pressure. Left Atrium: Left atrial size was normal in size. Right Atrium: Right atrial size was normal in size. Pericardium: There is no evidence of pericardial effusion. Mitral Valve: The mitral valve is grossly normal. Trivial mitral valve regurgitation. Tricuspid Valve: The tricuspid valve is grossly normal. Tricuspid valve regurgitation is trivial. Aortic Valve: The aortic valve is tricuspid. Aortic valve regurgitation is not visualized. Pulmonic Valve: The pulmonic valve was not well visualized.  Pulmonic valve regurgitation is trivial. Aorta: The aortic root is normal in size and structure. Venous: The inferior vena cava is dilated in size with greater than 50% respiratory variability, suggesting right atrial pressure of 8 mmHg. IAS/Shunts: No atrial level shunt detected by color flow Doppler.  LEFT VENTRICLE PLAX 2D LVIDd:         5.00 cm   Diastology LVIDs:         3.80 cm   LV e' medial:  6.53 cm/s LV PW:         1.10 cm   LV E/e' medial:  10.5 LV IVS:        0.80 cm   LV e' lateral:   6.85 cm/s LVOT diam:     2.00 cm   LV E/e' lateral: 10.0 LVOT Area:     3.14 cm  RIGHT VENTRICLE          IVC RV Basal diam:  3.50 cm  IVC diam: 2.10 cm LEFT ATRIUM             Index        RIGHT ATRIUM           Index LA diam:        2.60 cm 1.33 cm/m   RA Area:     16.40 cm LA Vol (A2C):   58.9 ml 30.08 ml/m  RA Volume:   44.00 ml  22.47 ml/m LA Vol (A4C):   41.4 ml 21.14 ml/m LA Biplane Vol: 50.2 ml 25.64 ml/m   AORTA Ao Root diam: 3.70 cm MITRAL VALVE MV Area (PHT): 2.83 cm    SHUNTS MV Decel Time: 268 msec    Systemic Diam: 2.00 cm MV E velocity: 68.70 cm/s MV A velocity: 93.30 cm/s MV E/A ratio:  0.74 Rozann Lesches MD Electronically signed by Rozann Lesches MD Signature Date/Time: 10/21/2021/3:53:49 PM    Final     Cardiac Studies   Echo 10/21/2021  1. Left ventricular ejection fraction, by estimation, is 40 to 45%. The  left ventricle has mildly decreased function. The left ventricle  demonstrates regional wall motion abnormalities (see scoring  diagram/findings for description). There is mild left  ventricular hypertrophy. Left ventricular diastolic parameters are  indeterminate.   2. Right ventricular systolic function is normal. The right ventricular  size is normal. Tricuspid regurgitation signal is inadequate for assessing  PA pressure.   3. The mitral valve is grossly normal. Trivial mitral valve  regurgitation.   4. The aortic valve is tricuspid. Aortic valve regurgitation is not   visualized.   5. The inferior vena cava is dilated in size with >50% respiratory  variability, suggesting right atrial pressure of 8 mmHg.   Comparison(s): Prior images unable to be directly viewed.   Patient Profile     66 y.o. male with PMH Ischemic cardiomyopathy, h/o anterolateral STEMI 2013 s/p PCI LAD, staged PCI to LCX, CTO RCA (L->R collaterals), HTN, HLD  who is being seen 10/21/2021 for the evaluation of chest pain/NSTEMI.  Assessment & Plan    NSTEMI  - HSTN peaked at [place] on 2/4 - History of prior STEMI in 2013 with PCI to LAD (DESx2) and Circumflex (DESx1)  - EKG with old anterior infarct pattern, lateral T wave invesions  - No recurrent chest pain  - Echo this admission showed LVEF 40-45% with regional wall motion abnormalities, suspicious for ischemic cardiomyopathy  - Continue daily aspirin, statin - Metoprolol decreased to 12.5mg  BID due to bradycardia, low BP. BP and HR tolerating decreased dose well  - On IV heparin prior to cath   HFrEF  - Echo this admission showed LVEF 40-45% with regional wall motion abnormalities.   - Suspicious for ischemic cardiomyopathy - Euvolemic on exam  - Could possibly benefit from ARB, however not much BP room.  - On metoprolol 12.5mg  BID   HTN  - Blood pressure well controlled on current regiment   HLD  - On lipitor 80mg .  -  LDL 111, HDL 49 this admission   For questions or updates, please contact [place] Please consult [email or web address] for contact info under        Signed, Margie Billet, PA-C  10/23/2021, 9:05 AM     I have examined the patient and reviewed assessment and plan and discussed with patient.  Agree with above as stated.    Brisk right radial pulse.  All questions about cath answered.  Cath later today.  Patient is agreeable.  Titrate meds as BP allows.   LDL target is < 70 for this patient.    Larae Grooms

## 2021-10-24 ENCOUNTER — Inpatient Hospital Stay (HOSPITAL_COMMUNITY): Payer: 59

## 2021-10-24 ENCOUNTER — Other Ambulatory Visit (HOSPITAL_COMMUNITY): Payer: Self-pay

## 2021-10-24 ENCOUNTER — Encounter (HOSPITAL_COMMUNITY): Payer: Self-pay | Admitting: Cardiovascular Disease

## 2021-10-24 DIAGNOSIS — I10 Essential (primary) hypertension: Secondary | ICD-10-CM | POA: Diagnosis not present

## 2021-10-24 DIAGNOSIS — I25110 Atherosclerotic heart disease of native coronary artery with unstable angina pectoris: Secondary | ICD-10-CM | POA: Diagnosis not present

## 2021-10-24 DIAGNOSIS — Z01810 Encounter for preprocedural cardiovascular examination: Secondary | ICD-10-CM | POA: Diagnosis not present

## 2021-10-24 DIAGNOSIS — E782 Mixed hyperlipidemia: Secondary | ICD-10-CM | POA: Diagnosis not present

## 2021-10-24 DIAGNOSIS — I5021 Acute systolic (congestive) heart failure: Secondary | ICD-10-CM | POA: Diagnosis not present

## 2021-10-24 DIAGNOSIS — I214 Non-ST elevation (NSTEMI) myocardial infarction: Secondary | ICD-10-CM | POA: Diagnosis not present

## 2021-10-24 LAB — BLOOD GAS, ARTERIAL
Acid-Base Excess: 1.2 mmol/L (ref 0.0–2.0)
Bicarbonate: 25.2 mmol/L (ref 20.0–28.0)
FIO2: 21
O2 Saturation: 95.6 %
Patient temperature: 36.6
pCO2 arterial: 38.7 mmHg (ref 32.0–48.0)
pH, Arterial: 7.427 (ref 7.350–7.450)
pO2, Arterial: 77.8 mmHg — ABNORMAL LOW (ref 83.0–108.0)

## 2021-10-24 LAB — PROTIME-INR
INR: 1.1 (ref 0.8–1.2)
Prothrombin Time: 14.4 seconds (ref 11.4–15.2)

## 2021-10-24 LAB — CBC
HCT: 37.9 % — ABNORMAL LOW (ref 39.0–52.0)
Hemoglobin: 13.3 g/dL (ref 13.0–17.0)
MCH: 32.8 pg (ref 26.0–34.0)
MCHC: 35.1 g/dL (ref 30.0–36.0)
MCV: 93.3 fL (ref 80.0–100.0)
Platelets: 183 10*3/uL (ref 150–400)
RBC: 4.06 MIL/uL — ABNORMAL LOW (ref 4.22–5.81)
RDW: 12.3 % (ref 11.5–15.5)
WBC: 4.9 10*3/uL (ref 4.0–10.5)
nRBC: 0 % (ref 0.0–0.2)

## 2021-10-24 LAB — COMPREHENSIVE METABOLIC PANEL
ALT: 48 U/L — ABNORMAL HIGH (ref 0–44)
AST: 74 U/L — ABNORMAL HIGH (ref 15–41)
Albumin: 3.3 g/dL — ABNORMAL LOW (ref 3.5–5.0)
Alkaline Phosphatase: 40 U/L (ref 38–126)
Anion gap: 7 (ref 5–15)
BUN: 15 mg/dL (ref 8–23)
CO2: 26 mmol/L (ref 22–32)
Calcium: 8.8 mg/dL — ABNORMAL LOW (ref 8.9–10.3)
Chloride: 104 mmol/L (ref 98–111)
Creatinine, Ser: 1.05 mg/dL (ref 0.61–1.24)
GFR, Estimated: >60 mL/min (ref >60)
Glucose, Bld: 85 mg/dL (ref 70–99)
Potassium: 4 mmol/L (ref 3.5–5.1)
Sodium: 137 mmol/L (ref 135–145)
Total Bilirubin: 0.5 mg/dL (ref 0.3–1.2)
Total Protein: 6.5 g/dL (ref 6.5–8.1)

## 2021-10-24 LAB — SURGICAL PCR SCREEN
MRSA, PCR: NEGATIVE
Staphylococcus aureus: NEGATIVE

## 2021-10-24 LAB — TSH: TSH: 0.709 u[IU]/mL (ref 0.350–4.500)

## 2021-10-24 LAB — HEPARIN LEVEL (UNFRACTIONATED): Heparin Unfractionated: 0.39 IU/mL (ref 0.30–0.70)

## 2021-10-24 MED ORDER — SPIRONOLACTONE 12.5 MG HALF TABLET
12.5000 mg | ORAL_TABLET | Freq: Every day | ORAL | Status: DC
  Administered 2021-10-24 – 2021-10-25 (×2): 12.5 mg via ORAL
  Filled 2021-10-24 (×2): qty 1

## 2021-10-24 NOTE — Progress Notes (Addendum)
Progress Note  Patient Name: Jorge Fisher Date of Encounter: 10/24/2021  [facility] Cardiologist: Larae Grooms, MD   Subjective   Patient denies chest pain, sob, palpitations, dizziness. No pain or bleeding from right wrist cath site  Inpatient Medications    Scheduled Meds:  aspirin EC  81 mg Oral Daily   atorvastatin  80 mg Oral Daily   metoprolol tartrate  12.5 mg Oral BID   sodium chloride flush  3 mL Intravenous Q12H   sodium chloride flush  3 mL Intravenous Q12H   Continuous Infusions:  sodium chloride     heparin 1,300 Units/hr (10/24/21 0439)   PRN Meds: sodium chloride, acetaminophen, morphine injection, nitroGLYCERIN, ondansetron (ZOFRAN) IV, sodium chloride flush   Vital Signs    Vitals:   10/23/21 1549 10/23/21 1556 10/23/21 2004 10/24/21 0456  BP:  118/72 113/65 102/65  Pulse: (!) 0 73 76 (!) 57  Resp:  14  19  Temp:  97.6 F (36.4 C) 97.6 F (36.4 C) 97.8 F (36.6 C)  TempSrc:  Oral Oral Oral  SpO2:  95% 97%   Weight:      Height:        Intake/Output Summary (Last 24 hours) at 10/24/2021 0740 Last data filed at 10/24/2021 0439 Gross per 24 hour  Intake 863.29 ml  Output --  Net 863.29 ml   Last 3 Weights 10/21/2021 10/20/2021 03/11/2012  Weight (lbs) 172 lb 1.6 oz 198 lb 6.6 oz 186 lb  Weight (kg) 78.064 kg 90 kg 84.369 kg      Telemetry    Sinus rhythm, HR in 60s-70s - Personally Reviewed  ECG    No new tracings - Personally Reviewed  Physical Exam   GEN: No acute distress.   Neck: No JVD Cardiac: RRR, no murmurs, rubs, or gallops.  Respiratory: Clear to auscultation bilaterally. GI: Soft, nontender, non-distended  MS: No edema; No deformity. Right radial cath site stable good hemostasis  Neuro:  Nonfocal  Psych: Normal affect   Labs    High Sensitivity Troponin:   Recent Labs  Lab 10/21/21 0002 10/21/21 0200 10/21/21 0715  TROPONINIHS 102* 878* 7,623*     Chemistry Recent Labs  Lab 10/21/21 0002  10/23/21 0640  NA 137 138  K 3.9 3.8  CL 101 103  CO2 28 27  GLUCOSE 177* 93  BUN 12 9  CREATININE 1.12 1.17  CALCIUM 9.4 9.3  GFRNONAA >60 >60  ANIONGAP 8 8    Lipids  Recent Labs  Lab 10/21/21 0002  CHOL 168  TRIG 40  HDL 49  LDLCALC 111*  CHOLHDL 3.4    Hematology Recent Labs  Lab 10/22/21 0222 10/23/21 0640 10/24/21 0555  WBC 6.0 5.2 4.9  RBC 3.85* 4.31 4.06*  HGB 12.4* 13.9 13.3  HCT 36.9* 41.0 37.9*  MCV 95.8 95.1 93.3  MCH 32.2 32.3 32.8  MCHC 33.6 33.9 35.1  RDW 12.7 12.5 12.3  PLT 167 187 183   Thyroid  Recent Labs  Lab 10/21/21 0353  TSH 0.335*    BNPNo results for input(s): BNP, PROBNP in the last 168 hours.  DDimer No results for input(s): DDIMER in the last 168 hours.   Radiology    CARDIAC CATHETERIZATION  Result Date: 10/23/2021 Images from the original result were not included.   Prox RCA to Dist RCA lesion is 95% stenosed.   RPAV lesion is 100% stenosed.   RPDA lesion is 95% stenosed.   Ost LAD to [facility]  LAD lesion is 95% stenosed.   Ost LM lesion is 80% stenosed.   1st Diag lesion is 90% stenosed.   Mid Cx to Dist Cx lesion is 90% stenosed.   Ost Cx to Mid Cx lesion is 90% stenosed. Jorge Fisher is a 66 y.o. male  [id number] LOCATION:  FACILITY: [place] [facility] PHYSICIAN: Quay Burow, M.D. [birth year] DATE OF PROCEDURE:  10/23/2021 DATE OF DISCHARGE: CARDIAC CATHETERIZATION History obtained from chart review.66 y.o. male with PMH Ischemic cardiomyopathy, h/o anterolateral STEMI 2013 s/p PCI LAD, staged PCI to LCX, CTO RCA (L->R collaterals), HTN, HLD  who is being seen 10/21/2021 for the evaluation of chest pain/NSTEMI.  His ejection fraction was in the 40 to 45% range.  Troponins peaked at 7000.   Jorge Fisher has left main/three-vessel disease and moderate LV dysfunction.  He will need CABG for complete revascularization.  The radial sheath was removed and a TR band was placed on the right wrist to achieve patent hemostasis.  The patient left lab in stable  condition.  Heparin will be restarted 4 hours after sheath removal without a bolus. Quay Burow. MD, [facility] 10/23/2021 3:53 PM     Cardiac Studies   Cath 10/23/2021     Prox RCA to Dist RCA lesion is 95% stenosed.   RPAV lesion is 100% stenosed.   RPDA lesion is 95% stenosed.   Ost LAD to Mid LAD lesion is 95% stenosed.   Ost LM lesion is 80% stenosed.   1st Diag lesion is 90% stenosed.   Mid Cx to Dist Cx lesion is 90% stenosed.   Ost Cx to Mid Cx lesion is 90% stenosed. 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  He will need CABG for complete revascularization.  The radial sheath was removed and a TR band was placed on the right wrist to achieve patent hemostasis.  The patient left lab in stable condition.  Heparin will be restarted 4 hours after sheath removal without a bolus. Diagnostic Dominance: Right   Echo 10/21/2021  1. Left ventricular ejection fraction, by estimation, is 40 to 45%. The  left ventricle has mildly decreased function. The left ventricle  demonstrates regional wall motion abnormalities (see scoring  diagram/findings for description). There is mild left  ventricular hypertrophy. Left ventricular diastolic parameters are  indeterminate.   2. Right ventricular systolic function is normal. The right ventricular  size is normal. Tricuspid regurgitation signal is inadequate for assessing  PA pressure.   3. The mitral valve is grossly normal. Trivial mitral valve  regurgitation.   4. The aortic valve is tricuspid. Aortic valve regurgitation is not  visualized.   5. The inferior vena cava is dilated in size with >50% respiratory  variability, suggesting right atrial pressure of 8 mmHg.   Comparison(s): Prior images unable to be directly viewed.   Patient Profile     66 y.o. male with PMH Ischemic cardiomyopathy, h/o anterolateral STEMI 2013 s/p PCI LAD, staged PCI to LCX, CTO RCA (L->R collaterals), HTN, HLD  who was first  seen on 10/21/2021 for the evaluation of chest pain/NSTEMI.   Assessment & Plan    NSTEMI  - HSTN peaked at [place] on 2/4 - History of prior STEMI in 2013 with PCI to LAD (DESx2) and Circumflex (DESx1)  - EKG with old anterior infarct pattern, lateral T wave invesions  - No recurrent chest pain  - Echo this admission showed LVEF 40-45% with regional wall motion abnormalities, suspicious for ischemic cardiomyopathy  -  Underwent LHC on 2/6 that showed left main/three-vessel disease. Will need CABG for complete revascularization - CT surgery consulted   - Continue daily aspirin, statin - Metoprolol decreased to 12.5mg  BID due to bradycardia, low BP. BP and HR tolerating decreased dose well  - Per chart review, patient was prescribed effient. Patient denies taking this medication PTA, should not interfere with/delay any plans for CABG   HFrEF, Ischemic cardiomyopathy   - Echo this admission showed LVEF 40-45% with regional wall motion abnormalities.   - LHC yesterday confirmed the presence of significant CAD - Euvolemic on exam  - Could possibly benefit from ARB, however not much BP room. Kidney function stable, will start spironolactone. Can consider adding SGLT2i prior to discharge   - On metoprolol 12.5mg  BID   HTN - BP stable and well controlled on metoprolol 12.5   HLD  - On lipitor 80mg .  - LDL 111, HDL 49 this admission   For questions or updates, please contact [place] Please consult [email or web address] for contact info under        Signed, Margie Billet, PA-C  10/24/2021, 7:40 AM     I have examined the patient and reviewed assessment and plan and discussed with patient.  Agree with above as stated.    Right wrist stable.  Multivessel coronary artery disease.  Surgical consult in progress.  Patient understands that bypass surgery will be his best option but is somewhat anxious, understandably.  Continue IV heparin for now given NSTEMI.  After bypass, would benefit  from dual antiplatelet therapy with aspirin and clopidogrel.  Larae Grooms

## 2021-10-24 NOTE — Progress Notes (Signed)
ANTICOAGULATION CONSULT NOTE - Follow Up Consult  Pharmacy Consult for Heparin Indication: chest pain/ACS  Allergies  Allergen Reactions   Peanuts [Peanut Oil] Swelling    cashews    Patient Measurements: Height: 5\' 10"  (177.8 cm) Weight: 78.1 kg (172 lb 1.6 oz) IBW/kg (Calculated) : 73   Vital Signs: Temp: 97.8 F (36.6 C) (02/07 0456) Temp Source: Oral (02/07 0456) BP: 102/65 (02/07 0456) Pulse Rate: 57 (02/07 0456)  Labs: Recent Labs    10/22/21 0222 10/23/21 0640 10/24/21 0555  HGB 12.4* 13.9 13.3  HCT 36.9* 41.0 37.9*  PLT 167 187 183  LABPROT  --   --  14.4  INR  --   --  1.1  HEPARINUNFRC 0.57 0.55 0.39  CREATININE  --  1.17  --      Estimated Creatinine Clearance: 63.3 mL/min (by C-G formula based on SCr of 1.17 mg/dL).  Assessment: 66 year old male on IV heparin for ACS. Pt s/p LHC with mvCAD, planning for CABG consult.  Heparin level therapeutic at 0.39, CBC stable.  Goal of Therapy:  Heparin level 0.3-0.7 units/ml Monitor platelets by anticoagulation protocol: Yes   Plan:  Continue heparin 1300 units/h Daily heparin level and CBC   Arrie Senate, PharmD, [place], [facility] Clinical Pharmacist [phone] Please check AMION for all [facility] numbers 10/24/2021

## 2021-10-24 NOTE — Progress Notes (Signed)
Pre-CABG study completed.  ° °Please see CV Proc for preliminary results.  ° °Creasie Lacosse, RDMS, RVT ° °

## 2021-10-24 NOTE — TOC Benefit Eligibility Note (Signed)
Patient Teacher, English as a foreign language completed.    The patient is currently admitted and upon discharge could be taking Jardiance 10 mg.  The current 30 day co-pay is, $35.00.   The patient is insured through [place], [place] Patient [place] Patient Advocate Team Direct Number: [phone]  Fax: [phone]

## 2021-10-24 NOTE — Progress Notes (Signed)
Pt up in recliner, no c/o. Discussed with pt IS (1750 ml), sternal precautions, mobility post op, and d/c planning. Pt receptive. Sts he is frustrated because he exercises and eats healthy and tries to stay away from hospitals. Appreciative of information. Gave materials to read and preop video to view.  [place], ACSM 2:15 PM 10/24/2021

## 2021-10-24 NOTE — Consult Note (Addendum)
[address]       [place] [address]             [phone]        Juddson W Haggard Palacios Medical Record #[id number] Date of Birth: February 22, 1956  Referring: No ref. provider found Primary Care: Pcp, No Primary Cardiologist:Jayadeep Irish Lack, MD  Chief Complaint:    Chief Complaint  Patient presents with   Chest Pain    History of Present Illness:    The patient is a 66 year old male with a cardiac history that dates back to May of 2013.  He was diagnosed at that time with an acute STEMI and underwent stenting of both the LAD and circumflex coronary arteries.  Additional cardiac risk factors include hypertension.  He presented to the emergency department on 10/20/2021 with complaints of midsternal chest pressure that started earlier in the day.  Initially the pain was intermittent but became more constant over time.  He described the pain at times had severe up to 10/10.  There were no notable aggravating or alleviating symptoms.  Pain was not worse with exertion.  It was associated with some dyspnea.  He initially felt the symptoms may be indigestion.  In the emergency department it was noted that CK, MB as well as high-sensitivity troponins were significantly elevated.  EKG showed a new right bundle branch block with nonspecific ST changes.  As he ruled in for non-STEMI with a TIMI score of 6, he received full dose aspirin as well as nitroglycerin x1 and was started on heparin drip in the ER.  Cardiology consultation was obtained.  Echocardiogram was obtained showing ejection fraction by estimation of 40 to 45%.  Left ventricular diastolic parameters were indeterminate.  There were no significant valvular disease findings.  Please see the full report below.  Cardiac catheterization was also performed and this showed severe left main and three-vessel coronary artery disease.  We are asked to see the patient in cardiothoracic surgical consultation for consideration of  CABG.    Current Activity/ Functional Status: Patient is independent with mobility/ambulation, transfers, ADL's, IADL's.   Zubrod Score: At the time of surgery this patients most appropriate activity status/level should be described as: []     0    Normal activity, no symptoms []     1    Restricted in physical strenuous activity but ambulatory, able to do out light work []     2    Ambulatory and capable of self care, unable to do work activities, up and about                 more than 50%  Of the time                            []     3    Only limited self care, in bed greater than 50% of waking hours []     4    Completely disabled, no self care, confined to bed or chair []     5    Moribund  Past Medical History:  Diagnosis Date   CAD (coronary artery disease)    a) diagnosed with acute anterior STEMI with totally occluded LAD s/p PTCA/DES x 2 to prox and mid LAD 02/08/12, staged PTCA/DES to Cx 02/12/12, diagonal dz for med rx, & chronically occ RCA with collaterals.    Colorectal polyps    Hemorrhoids  Hypertension    Mild HTN in past, but more recently borderline low blood pressures   Ischemic cardiomyopathy    Improved - EF 25-30% by cath 02/08/12 then 50-55% by echo 02/11/12    Past Surgical History:  Procedure Laterality Date   LEFT HEART CATH AND CORONARY ANGIOGRAPHY N/A 10/23/2021   Procedure: LEFT HEART CATH AND CORONARY ANGIOGRAPHY;  Surgeon: Lorretta Harp, MD;  Location: [facility];  Service: Cardiovascular;  Laterality: N/A;   LEFT HEART CATHETERIZATION WITH CORONARY ANGIOGRAM N/A 02/08/2012   Procedure: LEFT HEART CATHETERIZATION WITH CORONARY ANGIOGRAM;  Surgeon: Burnell Blanks, MD;  Location: [facility] CATH LAB;  Service: Cardiovascular;  Laterality: N/A;   PERCUTANEOUS CORONARY STENT INTERVENTION (PCI-S) N/A 02/08/2012   Procedure: PERCUTANEOUS CORONARY STENT INTERVENTION (PCI-S);  Surgeon: Burnell Blanks, MD;  Location: [facility] CATH LAB;  Service:  Cardiovascular;  Laterality: N/A;   PERCUTANEOUS CORONARY STENT INTERVENTION (PCI-S) N/A 02/12/2012   Procedure: PERCUTANEOUS CORONARY STENT INTERVENTION (PCI-S);  Surgeon: Sherren Mocha, MD;  Location: [facility] CATH LAB;  Service: Cardiovascular;  Laterality: N/A;    Social History   Tobacco Use  Smoking Status Never  Smokeless Tobacco Not on file    Social History   Substance and Sexual Activity  Alcohol Use No     Allergies  Allergen Reactions   Peanuts [Peanut Oil] Swelling    cashews    Current Facility-Administered Medications  Medication Dose Route Frequency Provider Last Rate Last Admin   0.9 %  sodium chloride infusion  250 mL Intravenous PRN Lorretta Harp, MD       acetaminophen (TYLENOL) tablet 650 mg  650 mg Oral Q4H PRN Lorretta Harp, MD       aspirin EC tablet 81 mg  81 mg Oral Daily Lorretta Harp, MD   81 mg at 10/24/21 0805   atorvastatin (LIPITOR) tablet 80 mg  80 mg Oral Daily Lorretta Harp, MD   80 mg at 10/24/21 0805   heparin ADULT infusion 100 units/mL (25000 units/242mL)  1,300 Units/hr Intravenous Continuous Onnie Boer Q, RPH-CPP 13 mL/hr at 10/24/21 0439 1,300 Units/hr at 10/24/21 0439   metoprolol tartrate (LOPRESSOR) tablet 12.5 mg  12.5 mg Oral BID Lorretta Harp, MD   12.5 mg at 10/24/21 0804   morphine (PF) 2 MG/ML injection 2 mg  2 mg Intravenous Q1H PRN Lorretta Harp, MD       nitroGLYCERIN (NITROSTAT) SL tablet 0.4 mg  0.4 mg Sublingual Q5 Min x 3 PRN Lorretta Harp, MD       ondansetron [facility]) injection 4 mg  4 mg Intravenous Q6H PRN Lorretta Harp, MD       sodium chloride flush (NS) 0.9 % injection 3 mL  3 mL Intravenous Q12H Lorretta Harp, MD       sodium chloride flush (NS) 0.9 % injection 3 mL  3 mL Intravenous Q12H Lorretta Harp, MD   3 mL at 10/23/21 2045   sodium chloride flush (NS) 0.9 % injection 3 mL  3 mL Intravenous PRN Lorretta Harp, MD       spironolactone (ALDACTONE) tablet 12.5 mg  12.5 mg  Oral Daily Margie Billet, PA-C   12.5 mg at 10/24/21 0805    Medications Prior to Admission  Medication Sig Dispense Refill Last Dose   Ascorbic Acid (VITAMIN C) 1000 MG tablet Take 1,000 mg by mouth daily.     Past Week   aspirin EC  81 MG tablet Take 81 mg by mouth daily. Swallow whole.   Past Week   cholecalciferol (VITAMIN D) 1000 UNITS tablet Take 1,000 Units by mouth daily.   Past Week   CINNAMON PO Take 1 tablet by mouth daily.   Past Week   Garlic 267 MG TABS Take 1 tablet by mouth daily.   Past Week   MAGNESIUM PO Take 1 tablet by mouth daily.   Past Week   Multiple Vitamins-Minerals (MULTIVITAMIN WITH MINERALS) tablet Take 1 tablet by mouth daily.     Past Week   Omega-3 Fatty Acids (FISH OIL) 1000 MG CAPS Take 1 capsule by mouth 2 (two) times daily.   Past Week   atorvastatin (LIPITOR) 80 MG tablet Take 1 tablet (80 mg total) by mouth at bedtime. (Patient not taking: Reported on 10/22/2021) 30 tablet 6 Not Taking   EFFIENT 10 MG TABS TAKE ONE TABLET BY MOUTH EVERY DAY (Patient not taking: Reported on 10/22/2021) 30 each 5 Not Taking   lisinopril (PRINIVIL,ZESTRIL) 2.5 MG tablet Take 1 tablet (2.5 mg total) by mouth daily. (Patient not taking: Reported on 10/22/2021) 30 tablet 6 Not Taking   metoprolol tartrate (LOPRESSOR) 25 MG tablet TAKE ONE-HALF TABLET BY MOUTH TWICE DAILY (Patient not taking: Reported on 10/22/2021) 30 tablet 4 Not Taking    Family History  Problem Relation Age of Onset   Heart attack Brother 42   Heart disease Father 89   Diabetes type II Sister    Diabetes type II Brother      Review of Systems:   Review of Systems  Constitutional:  Negative for chills, diaphoresis, fever, malaise/fatigue and weight loss.  HENT:  Negative for congestion, ear discharge, ear pain, hearing loss, nosebleeds, sinus pain, sore throat and tinnitus.   Eyes: Negative.   Respiratory:  Negative for cough, hemoptysis, sputum production, shortness of breath, wheezing and stridor.    Cardiovascular:  Positive for chest pain. Negative for palpitations, orthopnea, claudication, leg swelling and PND.  Gastrointestinal:  Positive for constipation. Negative for abdominal pain, blood in stool, diarrhea, heartburn, melena, nausea and vomiting.  Genitourinary:  Positive for frequency. Negative for dysuria, flank pain, hematuria and urgency.  Musculoskeletal: Negative.   Skin:  Negative for itching and rash.  Neurological:  Positive for dizziness. Negative for tingling, tremors, sensory change, speech change, focal weakness, seizures, loss of consciousness, weakness and headaches.  Endo/Heme/Allergies:  Negative for environmental allergies and polydipsia. Does not bruise/bleed easily.  Psychiatric/Behavioral:  Negative for depression, hallucinations, memory loss, substance abuse and suicidal ideas. The patient has insomnia. The patient is not nervous/anxious.     Physical Exam: BP 111/64    Pulse (!) 56    Temp 97.8 F (36.6 C) (Oral)    Resp 19    Ht 5\' 10"  (1.778 m)    Wt 78.1 kg    SpO2 97%    BMI 24.69 kg/m    General appearance: alert, cooperative, appears stated age, and no distress Head: Normocephalic, without obvious abnormality, atraumatic Neck: no carotid bruit, no JVD, supple, symmetrical, trachea midline, thyroid not enlarged, symmetric, no tenderness/mass/nodules, and no bruits Lymph nodes: Cervical, supraclavicular, and axillary nodes normal. Resp: clear to auscultation bilaterally Back: symmetric, no curvature. ROM normal. No CVA tenderness. Cardio: regular rate and rhythm, S1, S2 normal, no murmur, click, rub or gallop GI: soft, non-tender; bowel sounds normal; no masses,  no organomegaly and + umbilical hernia Extremities: extremities normal, atraumatic, no cyanosis or edema Neurologic: Grossly normal  Peripheral pulses intact Fair dentition w/caries  Diagnostic Studies & Laboratory data:     Recent Radiology Findings:   CARDIAC CATHETERIZATION  Result  Date: 10/23/2021 Images from the original result were not included.   Prox RCA to Dist RCA lesion is 95% stenosed.   RPAV lesion is 100% stenosed.   RPDA lesion is 95% stenosed.   Ost LAD to Mid LAD lesion is 95% stenosed.   Ost LM lesion is 80% stenosed.   1st Diag lesion is 90% stenosed.   Mid Cx to Dist Cx lesion is 90% stenosed.   Ost Cx to Mid Cx lesion is 90% stenosed. LARZ MARK is a 66 y.o. male  [id number] LOCATION:  FACILITY: [facility] PHYSICIAN: Quay Burow, M.D. 07-Feb-1956 DATE OF PROCEDURE:  10/23/2021 DATE OF DISCHARGE: CARDIAC CATHETERIZATION History obtained from chart review.66 y.o. male with PMH Ischemic cardiomyopathy, h/o anterolateral STEMI 2013 s/p PCI LAD, staged PCI to LCX, CTO RCA (L->R collaterals), HTN, HLD  who is being seen 10/21/2021 for the evaluation of chest pain/NSTEMI.  His ejection fraction was in the 40 to 45% range.  Troponins peaked at 7000.   Mr. Patriarca has left main/three-vessel disease and moderate LV dysfunction.  He will need CABG for complete revascularization.  The radial sheath was removed and a TR band was placed on the right wrist to achieve patent hemostasis.  The patient left lab in stable condition.  Heparin will be restarted 4 hours after sheath removal without a bolus. Quay Burow. MD, [facility] 10/23/2021 3:53 PM       ECHOCARDIOGRAM REPORT         Patient Name:   Jorge Fisher Date of Exam: 10/21/2021  Medical Rec #:  [id number]      Height:       70.0 in  Accession #:    [id number]     Weight:       172.1 lb  Date of Birth:  05-Nov-1955      BSA:          1.958 m  Patient Age:    50 years       BP:           102/71 mmHg  Patient Gender: M              HR:           62 bpm.  Exam Location:  Inpatient   Procedure: 2D Echo, Cardiac Doppler and Color Doppler   Indications:    NSTEMI I21.4     History:        Patient has prior history of Echocardiogram examinations,  most                  recent 02/11/2012. Previous Myocardial Infarction and CAD.      Sonographer:    Merrie Roof RDCS  Referring Phys: [id number] [place]     1. Left ventricular ejection fraction, by estimation, is 40 to 45%. The  left ventricle has mildly decreased function. The left ventricle  demonstrates regional wall motion abnormalities (see scoring  diagram/findings for description). There is mild left  ventricular hypertrophy. Left ventricular diastolic parameters are  indeterminate.   2. Right ventricular systolic function is normal. The right ventricular  size is normal. Tricuspid regurgitation signal is inadequate for assessing  PA pressure.   3. The mitral valve is grossly normal. Trivial mitral valve  regurgitation.   4. The aortic valve is tricuspid. Aortic  valve regurgitation is not  visualized.   5. The inferior vena cava is dilated in size with >50% respiratory  variability, suggesting right atrial pressure of 8 mmHg.   Comparison(s): Prior images unable to be directly viewed.   FINDINGS   Left Ventricle: Left ventricular ejection fraction, by estimation, is 40  to 45%. The left ventricle has mildly decreased function. The left  ventricle demonstrates regional wall motion abnormalities. The left  ventricular internal cavity size was normal  in size. There is mild left ventricular hypertrophy. Left ventricular  diastolic parameters are indeterminate.      LV Wall Scoring:  The mid and distal anterior wall, entire anterior septum, apical lateral  segment, and apex are hypokinetic. The antero-lateral wall, entire  inferior  wall, posterior wall, mid inferoseptal segment, basal anterior segment,  and  basal inferoseptal segment are normal.   Right Ventricle: The right ventricular size is normal. No increase in  right ventricular wall thickness. Right ventricular systolic function is  normal. Tricuspid regurgitation signal is inadequate for assessing PA  pressure.   Left Atrium: Left atrial size was normal in size.    Right Atrium: Right atrial size was normal in size.   Pericardium: There is no evidence of pericardial effusion.   Mitral Valve: The mitral valve is grossly normal. Trivial mitral valve  regurgitation.   Tricuspid Valve: The tricuspid valve is grossly normal. Tricuspid valve  regurgitation is trivial.   Aortic Valve: The aortic valve is tricuspid. Aortic valve regurgitation is  not visualized.   Pulmonic Valve: The pulmonic valve was not well visualized. Pulmonic valve  regurgitation is trivial.   Aorta: The aortic root is normal in size and structure.   Venous: The inferior vena cava is dilated in size with greater than 50%  respiratory variability, suggesting right atrial pressure of 8 mmHg.   IAS/Shunts: No atrial level shunt detected by color flow Doppler.      LEFT VENTRICLE  PLAX 2D  LVIDd:         5.00 cm   Diastology  LVIDs:         3.80 cm   LV e' medial:    6.53 cm/s  LV PW:         1.10 cm   LV E/e' medial:  10.5  LV IVS:        0.80 cm   LV e' lateral:   6.85 cm/s  LVOT diam:     2.00 cm   LV E/e' lateral: 10.0  LVOT Area:     3.14 cm      RIGHT VENTRICLE          IVC  RV Basal diam:  3.50 cm  IVC diam: 2.10 cm   LEFT ATRIUM             Index        RIGHT ATRIUM           Index  LA diam:        2.60 cm 1.33 cm/m   RA Area:     16.40 cm  LA Vol (A2C):   58.9 ml 30.08 ml/m  RA Volume:   44.00 ml  22.47 ml/m  LA Vol (A4C):   41.4 ml 21.14 ml/m  LA Biplane Vol: 50.2 ml 25.64 ml/m      AORTA  Ao Root diam: 3.70 cm   MITRAL VALVE  MV Area (PHT): 2.83 cm    SHUNTS  MV Decel Time: 268  msec    Systemic Diam: 2.00 cm  MV E velocity: 68.70 cm/s  MV A velocity: 93.30 cm/s  MV E/A ratio:  0.74   Rozann Lesches MD  Electronically signed by Rozann Lesches MD  Signature Date/Time: 10/21/2021/3:53:49 PM         Final      I have independently reviewed the above radiologic studies and discussed with the patient   Recent Lab Findings: Lab Results   Component Value Date   WBC 4.9 10/24/2021   HGB 13.3 10/24/2021   HCT 37.9 (L) 10/24/2021   PLT 183 10/24/2021   GLUCOSE 85 10/24/2021   CHOL 168 10/21/2021   TRIG 40 10/21/2021   HDL 49 10/21/2021   LDLCALC 111 (H) 10/21/2021   ALT 48 (H) 10/24/2021   AST 74 (H) 10/24/2021   NA 137 10/24/2021   K 4.0 10/24/2021   CL 104 10/24/2021   CREATININE 1.05 10/24/2021   BUN 15 10/24/2021   CO2 26 10/24/2021   TSH 0.709 10/24/2021   INR 1.1 10/24/2021   HGBA1C 5.6 10/21/2021      Assessment / Plan: Severe left main and three-vessel coronary artery disease in the setting of non-STEMI.  Previous coronary stents 2013,moderately reduced left ventricular function Hypertension History of colonic polyps History of hemorrhoids   Plan: CABG, the surgeon will review all relevant studies and evaluate the patient.  I  spent 30 minutes counseling the patient face to face.   John Giovanni, PA-C  10/24/2021 9:08 AM I have examined the patient and reviewed his cath and  echo images He has not taken Effient for his remote LAD,Circ stents for years Pre Cabg dopplers ok No chest pain on iv heparin Plan CABG am Thurs 2-9  patient examined and medical record reviewed,agree with above note. Dahlia Byes 10/24/2021

## 2021-10-25 ENCOUNTER — Inpatient Hospital Stay (HOSPITAL_COMMUNITY)
Admit: 2021-10-25 | Discharge: 2021-10-25 | Disposition: A | Discharge status: Home / Self Care | Payer: 59 | Attending: Cardiothoracic Surgery | Admitting: Cardiothoracic Surgery

## 2021-10-25 DIAGNOSIS — I214 Non-ST elevation (NSTEMI) myocardial infarction: Secondary | ICD-10-CM | POA: Diagnosis not present

## 2021-10-25 DIAGNOSIS — I5021 Acute systolic (congestive) heart failure: Secondary | ICD-10-CM | POA: Diagnosis not present

## 2021-10-25 DIAGNOSIS — I10 Essential (primary) hypertension: Secondary | ICD-10-CM | POA: Diagnosis not present

## 2021-10-25 DIAGNOSIS — I2510 Atherosclerotic heart disease of native coronary artery without angina pectoris: Secondary | ICD-10-CM | POA: Diagnosis not present

## 2021-10-25 DIAGNOSIS — E782 Mixed hyperlipidemia: Secondary | ICD-10-CM | POA: Diagnosis not present

## 2021-10-25 LAB — CBC
HCT: 43.4 % (ref 39.0–52.0)
Hemoglobin: 14.4 g/dL (ref 13.0–17.0)
MCH: 32.5 pg (ref 26.0–34.0)
MCHC: 33.2 g/dL (ref 30.0–36.0)
MCV: 98 fL (ref 80.0–100.0)
Platelets: 176 10*3/uL (ref 150–400)
RBC: 4.43 MIL/uL (ref 4.22–5.81)
RDW: 12.7 % (ref 11.5–15.5)
WBC: 5.6 10*3/uL (ref 4.0–10.5)
nRBC: 0 % (ref 0.0–0.2)

## 2021-10-25 LAB — PULMONARY FUNCTION TEST
FEF 25-75 Pre: 3.2 L/sec
FEF2575-%Pred-Pre: 122 %
FEV1-%Pred-Pre: 112 %
FEV1-Pre: 3.34 L
FEV1FVC-%Pred-Pre: 104 %
FEV6-%Pred-Pre: 109 %
FEV6-Pre: 4.08 L
FEV6FVC-%Pred-Pre: 102 %
FVC-%Pred-Pre: 107 %
FVC-Pre: 4.18 L
Pre FEV1/FVC ratio: 80 %
Pre FEV6/FVC Ratio: 98 %

## 2021-10-25 LAB — BASIC METABOLIC PANEL
Anion gap: 12 (ref 5–15)
BUN: 11 mg/dL (ref 8–23)
CO2: 18 mmol/L — ABNORMAL LOW (ref 22–32)
Calcium: 9.1 mg/dL (ref 8.9–10.3)
Chloride: 107 mmol/L (ref 98–111)
Creatinine, Ser: 1.07 mg/dL (ref 0.61–1.24)
GFR, Estimated: >60 mL/min (ref >60)
Glucose, Bld: 82 mg/dL (ref 70–99)
Potassium: 4.4 mmol/L (ref 3.5–5.1)
Sodium: 137 mmol/L (ref 135–145)

## 2021-10-25 LAB — PREPARE RBC (CROSSMATCH)

## 2021-10-25 LAB — PLATELET INHIBITION P2Y12

## 2021-10-25 LAB — ABO/RH: ABO/RH(D): A POS

## 2021-10-25 LAB — HEPARIN LEVEL (UNFRACTIONATED): Heparin Unfractionated: 0.33 IU/mL (ref 0.30–0.70)

## 2021-10-25 MED ORDER — NOREPINEPHRINE 4 MG/250ML-% IV SOLN
0.0000 ug/min | INTRAVENOUS | Status: DC
  Filled 2021-10-25: qty 250

## 2021-10-25 MED ORDER — VANCOMYCIN HCL 1250 MG/250ML IV SOLN
1250.0000 mg | INTRAVENOUS | Status: AC
  Administered 2021-10-26: 1250 mg via INTRAVENOUS
  Filled 2021-10-25: qty 250

## 2021-10-25 MED ORDER — CHLORHEXIDINE GLUCONATE 0.12 % MT SOLN
15.0000 mL | Freq: Once | OROMUCOSAL | Status: AC
  Administered 2021-10-26: 15 mL via OROMUCOSAL
  Filled 2021-10-25: qty 15

## 2021-10-25 MED ORDER — PHENYLEPHRINE HCL-NACL 20-0.9 MG/250ML-% IV SOLN
30.0000 ug/min | INTRAVENOUS | Status: AC
  Administered 2021-10-26: 40 ug/min via INTRAVENOUS
  Filled 2021-10-25: qty 250

## 2021-10-25 MED ORDER — METOPROLOL TARTRATE 12.5 MG HALF TABLET
12.5000 mg | ORAL_TABLET | Freq: Once | ORAL | Status: AC
  Administered 2021-10-26: 12.5 mg via ORAL
  Filled 2021-10-25: qty 1

## 2021-10-25 MED ORDER — CEFAZOLIN SODIUM-DEXTROSE 2-4 GM/100ML-% IV SOLN
2.0000 g | INTRAVENOUS | Status: DC
  Filled 2021-10-25: qty 100

## 2021-10-25 MED ORDER — MAGNESIUM SULFATE 50 % IJ SOLN
40.0000 meq | INTRAMUSCULAR | Status: DC
  Filled 2021-10-25: qty 9.85

## 2021-10-25 MED ORDER — INSULIN REGULAR(HUMAN) IN NACL 100-0.9 UT/100ML-% IV SOLN
INTRAVENOUS | Status: AC
  Administered 2021-10-26: .9 [IU]/h via INTRAVENOUS
  Filled 2021-10-25: qty 100

## 2021-10-25 MED ORDER — MILRINONE LACTATE IN DEXTROSE 20-5 MG/100ML-% IV SOLN
0.3000 ug/kg/min | INTRAVENOUS | Status: AC
  Administered 2021-10-26: .25 ug/kg/min via INTRAVENOUS
  Filled 2021-10-25: qty 100

## 2021-10-25 MED ORDER — BISACODYL 5 MG PO TBEC
5.0000 mg | DELAYED_RELEASE_TABLET | Freq: Once | ORAL | Status: DC
  Filled 2021-10-25: qty 1

## 2021-10-25 MED ORDER — TRANEXAMIC ACID (OHS) BOLUS VIA INFUSION
15.0000 mg/kg | INTRAVENOUS | Status: AC
  Administered 2021-10-26: 1171.5 mg via INTRAVENOUS
  Filled 2021-10-25: qty 1172

## 2021-10-25 MED ORDER — HEPARIN 30,000 UNITS/1000 ML (OHS) CELLSAVER SOLUTION
Status: DC
  Filled 2021-10-25: qty 1000

## 2021-10-25 MED ORDER — DIAZEPAM 5 MG PO TABS
5.0000 mg | ORAL_TABLET | Freq: Once | ORAL | Status: AC
  Administered 2021-10-26: 5 mg via ORAL
  Filled 2021-10-25: qty 1

## 2021-10-25 MED ORDER — TEMAZEPAM 15 MG PO CAPS: 15.0000 mg | ORAL_CAPSULE | Freq: Once | ORAL | Status: DC | PRN

## 2021-10-25 MED ORDER — CEFAZOLIN SODIUM-DEXTROSE 2-4 GM/100ML-% IV SOLN
2.0000 g | INTRAVENOUS | Status: AC
  Administered 2021-10-26 (×2): 2 g via INTRAVENOUS
  Filled 2021-10-25: qty 100

## 2021-10-25 MED ORDER — EPINEPHRINE HCL 5 MG/250ML IV SOLN IN NS
0.0000 ug/min | INTRAVENOUS | Status: DC
  Filled 2021-10-25: qty 250

## 2021-10-25 MED ORDER — POTASSIUM CHLORIDE 2 MEQ/ML IV SOLN
80.0000 meq | INTRAVENOUS | Status: DC
  Filled 2021-10-25: qty 40

## 2021-10-25 MED ORDER — TRANEXAMIC ACID (OHS) PUMP PRIME SOLUTION
2.0000 mg/kg | INTRAVENOUS | Status: DC
  Filled 2021-10-25: qty 1.56

## 2021-10-25 MED ORDER — PLASMA-LYTE A IV SOLN
INTRAVENOUS | Status: DC
  Filled 2021-10-25: qty 2.5

## 2021-10-25 MED ORDER — ALPRAZOLAM 0.25 MG PO TABS: 0.2500 mg | ORAL_TABLET | ORAL | Status: DC | PRN

## 2021-10-25 MED ORDER — NITROGLYCERIN IN D5W 200-5 MCG/ML-% IV SOLN
2.0000 ug/min | INTRAVENOUS | Status: DC
  Filled 2021-10-25: qty 250

## 2021-10-25 MED ORDER — CHLORHEXIDINE GLUCONATE 4 % EX LIQD
60.0000 mL | Freq: Once | CUTANEOUS | Status: AC
  Administered 2021-10-25: 4 via TOPICAL
  Filled 2021-10-25 (×2): qty 60

## 2021-10-25 MED ORDER — CHLORHEXIDINE GLUCONATE 4 % EX LIQD
60.0000 mL | Freq: Once | CUTANEOUS | Status: AC
  Administered 2021-10-26: 4 via TOPICAL
  Filled 2021-10-25: qty 60

## 2021-10-25 MED ORDER — TRANEXAMIC ACID 1000 MG/10ML IV SOLN
1.5000 mg/kg/h | INTRAVENOUS | Status: AC
  Administered 2021-10-26: 1.5 mg/kg/h via INTRAVENOUS
  Filled 2021-10-25: qty 25

## 2021-10-25 MED ORDER — DEXMEDETOMIDINE HCL IN NACL 400 MCG/100ML IV SOLN
0.1000 ug/kg/h | INTRAVENOUS | Status: AC
  Administered 2021-10-26: .3 ug/kg/h via INTRAVENOUS
  Filled 2021-10-25: qty 100

## 2021-10-25 NOTE — Progress Notes (Addendum)
Progress Note  Patient Name: Jorge Fisher Date of Encounter: 10/25/2021  [facility] Cardiologist: Larae Grooms, MD   Subjective   Patient denies any chest pain, trouble breathing.  Reports infrequent palpitations.  Is somewhat nervous about his upcoming surgery, believes that this anxiety is causing him to have a reduced appetite.  Denies any pain near radial cath site.  Inpatient Medications    Scheduled Meds:  aspirin EC  81 mg Oral Daily   atorvastatin  80 mg Oral Daily   metoprolol tartrate  12.5 mg Oral BID   sodium chloride flush  3 mL Intravenous Q12H   sodium chloride flush  3 mL Intravenous Q12H   spironolactone  12.5 mg Oral Daily   Continuous Infusions:  sodium chloride     heparin 1,300 Units/hr (10/24/21 1416)   PRN Meds: sodium chloride, acetaminophen, morphine injection, nitroGLYCERIN, ondansetron (ZOFRAN) IV, sodium chloride flush   Vital Signs    Vitals:   10/24/21 0804 10/24/21 1140 10/24/21 2039 10/25/21 0502  BP: 111/64 104/68 114/72 103/65  Pulse: (!) 56 63 69 61  Resp:  18 16 15   Temp:  98 F (36.7 C) 98.3 F (36.8 C) 98.3 F (36.8 C)  TempSrc:  Oral Oral Oral  SpO2:   96% 96%  Weight:      Height:        Intake/Output Summary (Last 24 hours) at 10/25/2021 0850 Last data filed at 10/25/2021 0500 Gross per 24 hour  Intake 290.32 ml  Output 500 ml  Net -209.68 ml   Last 3 Weights 10/21/2021 10/20/2021 03/11/2012  Weight (lbs) 172 lb 1.6 oz 198 lb 6.6 oz 186 lb  Weight (kg) 78.064 kg 90 kg 84.369 kg      Telemetry    Sinus rhythm, infrequent PVCs - Personally Reviewed  ECG    No new tracings - Personally Reviewed  Physical Exam   GEN: No acute distress.   Neck: No JVD Cardiac: RRR, no murmurs, rubs, or gallops.  Respiratory: Clear to auscultation bilaterally. GI: Soft, nontender, non-distended  MS: No edema; No deformity. Right radial cath site stable, denies pain or bleeding.  Neuro:  Nonfocal  Psych: Normal affect    Labs    High Sensitivity Troponin:   Recent Labs  Lab 10/21/21 0002 10/21/21 0200 10/21/21 0715  TROPONINIHS 102* 878* 7,623*     Chemistry Recent Labs  Lab 10/21/21 0002 10/23/21 0640 10/24/21 0555  NA 137 138 137  K 3.9 3.8 4.0  CL 101 103 104  CO2 28 27 26   GLUCOSE 177* 93 85  BUN 12 9 15   CREATININE 1.12 1.17 1.05  CALCIUM 9.4 9.3 8.8*  PROT  --   --  6.5  ALBUMIN  --   --  3.3*  AST  --   --  74*  ALT  --   --  48*  ALKPHOS  --   --  40  BILITOT  --   --  0.5  GFRNONAA >60 >60 >60  ANIONGAP 8 8 7     Lipids  Recent Labs  Lab 10/21/21 0002  CHOL 168  TRIG 40  HDL 49  LDLCALC 111*  CHOLHDL 3.4    Hematology Recent Labs  Lab 10/23/21 0640 10/24/21 0555 10/25/21 0457  WBC 5.2 4.9 5.6  RBC 4.31 4.06* 4.43  HGB 13.9 13.3 14.4  HCT 41.0 37.9* 43.4  MCV 95.1 93.3 98.0  MCH 32.3 32.8 32.5  MCHC 33.9 35.1 33.2  RDW  12.5 12.3 12.7  PLT 187 183 176   Thyroid  Recent Labs  Lab 10/24/21 0555  TSH 0.709    BNPNo results for input(s): BNP, PROBNP in the last 168 hours.  DDimer No results for input(s): DDIMER in the last 168 hours.   Radiology    CARDIAC CATHETERIZATION  Result Date: 10/23/2021 Images from the original result were not included.   Prox RCA to Dist RCA lesion is 95% stenosed.   RPAV lesion is 100% stenosed.   RPDA lesion is 95% stenosed.   Ost LAD to Mid LAD lesion is 95% stenosed.   Ost LM lesion is 80% stenosed.   1st Diag lesion is 90% stenosed.   Mid Cx to Dist Cx lesion is 90% stenosed.   Ost Cx to Mid Cx lesion is 90% stenosed. Jorge Fisher is a 66 y.o. male  [id number] LOCATION:  FACILITY: [facility] PHYSICIAN: Quay Burow, M.D. June 29, 1956 DATE OF PROCEDURE:  10/23/2021 DATE OF DISCHARGE: CARDIAC CATHETERIZATION History obtained from chart review.66 y.o. male with PMH Ischemic cardiomyopathy, h/o anterolateral STEMI 2013 s/p PCI LAD, staged PCI to LCX, CTO RCA (L->R collaterals), HTN, HLD  who is being seen 10/21/2021 for the evaluation of  chest pain/NSTEMI.  His ejection fraction was in the 40 to 45% range.  Troponins peaked at 7000.   Mr. 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  He will need CABG for complete revascularization.  The radial sheath was removed and a TR band was placed on the right wrist to achieve patent hemostasis.  The patient left lab in stable condition.  Heparin will be restarted 4 hours after sheath removal without a bolus. Quay Burow. MD, [facility] 10/23/2021 3:53 PM    VAS US DOPPLER PRE CABG  Result Date: 10/24/2021 PREOPERATIVE VASCULAR EVALUATION Patient Name:  Jorge Fisher  Date of Exam:   10/24/2021 Medical Rec #: [id number]       Accession #:    [id number] Date of Birth: 10-23-1955       Patient Gender: M Patient Age:   66 years Exam Location:  [facility] Procedure:      VAS US DOPPLER PRE CABG Referring Phys: Collier Salina VANTRIGT --------------------------------------------------------------------------------  Indications:      Pre-CABG. Risk Factors:     Hypertension, prior MI, coronary artery disease. Other Factors:    Family history. Comparison Study: No prior studies. Performing Technologist: Darlin Coco RDMS RVT  Examination Guidelines: A complete evaluation includes B-mode imaging, spectral Doppler, color Doppler, and power Doppler as needed of all accessible portions of each vessel. Bilateral testing is considered an integral part of a complete examination. Limited examinations for reoccurring indications may be performed as noted.  Right Carotid Findings: +----------+--------+--------+--------+-----------------+------------------+             PSV cm/s EDV cm/s Stenosis Describe          Comments            +----------+--------+--------+--------+-----------------+------------------+  CCA Prox   100      15                                                      +----------+--------+--------+--------+-----------------+------------------+  CCA Distal 76       15  intimal thickening  +----------+--------+--------+--------+-----------------+------------------+  ICA Prox   63       17                heterogenous mild                     +----------+--------+--------+--------+-----------------+------------------+  ICA Distal 68       20                                                      +----------+--------+--------+--------+-----------------+------------------+  ECA        91       8                                                       +----------+--------+--------+--------+-----------------+------------------+ +----------+--------+-------+----------------+------------+             PSV cm/s EDV cms Describe         Arm Pressure  +----------+--------+-------+----------------+------------+  Subclavian 107              Multiphasic, WNL               +----------+--------+-------+----------------+------------+ +---------+--------+--+--------+--+---------+  Vertebral PSV cm/s 53 EDV cm/s 14 Antegrade  +---------+--------+--+--------+--+---------+ Left Carotid Findings: +----------+--------+--------+--------+-----------------+------------------+             PSV cm/s EDV cm/s Stenosis Describe          Comments            +----------+--------+--------+--------+-----------------+------------------+  CCA Prox   75       12                                                      +----------+--------+--------+--------+-----------------+------------------+  CCA Distal 74       14                                  intimal thickening  +----------+--------+--------+--------+-----------------+------------------+  ICA Prox   92       24                heterogenous mild                     +----------+--------+--------+--------+-----------------+------------------+  ICA Distal 74       22                                                      +----------+--------+--------+--------+-----------------+------------------+  ECA        83       6                                                        +----------+--------+--------+--------+-----------------+------------------+  +----------+--------+--------+----------------+------------+  Subclavian PSV cm/s EDV cm/s Describe         Arm Pressure  +----------+--------+--------+----------------+------------+             122               Multiphasic, WNL               +----------+--------+--------+----------------+------------+ +---------+--------+--+--------+--+---------+  Vertebral PSV cm/s 63 EDV cm/s 16 Antegrade  +---------+--------+--+--------+--+---------+  ABI Findings: +--------+------------------+-----+---------+--------+  Right    Rt Pressure (mmHg) Index Waveform  Comment   +--------+------------------+-----+---------+--------+  Brachial 111                      triphasic           +--------+------------------+-----+---------+--------+  PTA      122                1.09  triphasic           +--------+------------------+-----+---------+--------+  DP       117                1.04  triphasic           +--------+------------------+-----+---------+--------+ +--------+------------------+-----+---------+-------+  Left     Lt Pressure (mmHg) Index Waveform  Comment  +--------+------------------+-----+---------+-------+  Brachial 112                      triphasic          +--------+------------------+-----+---------+-------+  PTA      137                1.22  triphasic          +--------+------------------+-----+---------+-------+  DP       100                0.89  biphasic           +--------+------------------+-----+---------+-------+  Right Doppler Findings: +--------+--------+-----+---------+--------+  Site     Pressure Index Doppler   Comments  +--------+--------+-----+---------+--------+  Brachial 111            triphasic           +--------+--------+-----+---------+--------+  Radial                  triphasic           +--------+--------+-----+---------+--------+  Ulnar                   triphasic            +--------+--------+-----+---------+--------+  Left Doppler Findings: +--------+--------+-----+---------+--------+  Site     Pressure Index Doppler   Comments  +--------+--------+-----+---------+--------+  Brachial 112            triphasic           +--------+--------+-----+---------+--------+  Radial                  triphasic           +--------+--------+-----+---------+--------+  Ulnar                   triphasic           +--------+--------+-----+---------+--------+  Summary: Right Carotid: The extracranial vessels were near-normal with only minimal wall                thickening or plaque. Left Carotid: The extracranial vessels were near-normal with only minimal wall  thickening or plaque. Vertebrals:  Bilateral vertebral arteries demonstrate antegrade flow. Subclavians: Normal flow hemodynamics were seen in bilateral subclavian              arteries. Right ABI: Resting right ankle-brachial index is within normal range. No evidence of significant right lower extremity arterial disease. Left ABI: Resting left ankle-brachial index is within normal range. No evidence of significant left lower extremity arterial disease. Right Upper Extremity: Doppler waveforms remain within normal limits with right radial compression. Doppler waveforms remain within normal limits with right ulnar compression. Left Upper Extremity: Doppler waveforms remain within normal limits with left radial compression. Doppler waveforms remain within normal limits with left ulnar compression.  Electronically signed by Servando Snare MD on 10/24/2021 at 4:40:17 PM.    Final     Cardiac Studies   Cath 10/23/2021     Prox RCA to Dist RCA lesion is 95% stenosed.   RPAV lesion is 100% stenosed.   RPDA lesion is 95% stenosed.   Ost LAD to Mid LAD lesion is 95% stenosed.   Ost LM lesion is 80% stenosed.   1st Diag lesion is 90% stenosed.   Mid Cx to Dist Cx lesion is 90% stenosed.   Ost Cx to Mid Cx lesion is 90%  stenosed. IMPRESSION: Mr. Stavros has left main/three-vessel disease and moderate LV dysfunction.  He will need CABG for complete revascularization.  The radial sheath was removed and a TR band was placed on the right wrist to achieve patent hemostasis.  The patient left lab in stable condition.  Heparin will be restarted 4 hours after sheath removal without a bolus. Diagnostic Dominance: Right   Echo 10/21/2021  1. Left ventricular ejection fraction, by estimation, is 40 to 45%. The  left ventricle has mildly decreased function. The left ventricle  demonstrates regional wall motion abnormalities (see scoring  diagram/findings for description). There is mild left  ventricular hypertrophy. Left ventricular diastolic parameters are  indeterminate.   2. Right ventricular systolic function is normal. The right ventricular  size is normal. Tricuspid regurgitation signal is inadequate for assessing  PA pressure.   3. The mitral valve is grossly normal. Trivial mitral valve  regurgitation.   4. The aortic valve is tricuspid. Aortic valve regurgitation is not  visualized.   5. The inferior vena cava is dilated in size with >50% respiratory  variability, suggesting right atrial pressure of 8 mmHg.   Comparison(s): Prior images unable to be directly viewed.   Patient Profile     66 y.o. male  with PMH Ischemic cardiomyopathy, h/o anterolateral STEMI 2013 s/p PCI LAD, staged PCI to LCX, CTO RCA (L->R collaterals), HTN, HLD  who was first seen on 10/21/2021 for the evaluation of chest pain/NSTEMI.  Assessment & Plan    NSTEMI  - History of prior STEMI in 2013 with PCI to LAD (DESx2) and Circumflex (DESx1)  - HSTN peaked at [place] on 2/4 - Underwent LHC on 2/6 that showed left main/three-vessel disease. Will need CABG for complete revascularization - CT surgery consulted- plan for CABG 2/9 - Continue daily aspirin, statin - Continue IV heparin given NSTEMI  - Metoprolol decreased to 12.5mg  BID due to  bradycardia, low BP. BP and HR tolerating decreased dose well  - Following CABG, patient should be started on DAPT    HFrEF, Ischemic cardiomyopathy   - Echo this admission showed LVEF 40-45% with regional wall motion abnormalities.   - LHC yesterday confirmed the presence of significant CAD -  Euvolemic on exam  - Could possibly benefit from ARB, however not much BP room. Started spironolactone yesterday, kidney function continues to be stable.    Can consider adding SGLT2i prior to discharge   - On metoprolol 12.5mg  BID    HTN - BP stable and well controlled on metoprolol 12.5    HLD  - Restarted lipitor 80mg  (was previously prescribed, but patient reported that he was not taking it prior to admission) - LDL 111, HDL 49 this admission   - AST and ALT mildly elevated on CMP this morning. Will need to monitor and potentially adjust statin therapy. Recommend LFTs and Lipid Panel in about 8 weeks    For questions or updates, please contact [place] Please consult [email or web address] for contact info under        Signed, Margie Billet, PA-C  10/25/2021, 8:50 AM    I have examined the patient and reviewed assessment and plan and discussed with patient.  Agree with above as stated.    Doing well.  Nervous about surgery tomorrow. Will need aspirin and PLavix after cath. High dose lipid lowering therapy. Healthy diet and regular activity.   Larae Grooms

## 2021-10-25 NOTE — Progress Notes (Signed)
ANTICOAGULATION CONSULT NOTE - Follow Up Consult  Pharmacy Consult for Heparin Indication: chest pain/ACS  Allergies  Allergen Reactions   Peanuts [Peanut Oil] Swelling    cashews    Patient Measurements: Height: 5\' 10"  (177.8 cm) Weight: 78.1 kg (172 lb 1.6 oz) IBW/kg (Calculated) : 73   Vital Signs: Temp: 98.3 F (36.8 C) (02/08 0502) Temp Source: Oral (02/08 0502) BP: 103/65 (02/08 0502) Pulse Rate: 61 (02/08 0502)  Labs: Recent Labs    10/23/21 0640 10/24/21 0555 10/25/21 0457  HGB 13.9 13.3 14.4  HCT 41.0 37.9* 43.4  PLT 187 183 176  LABPROT  --  14.4  --   INR  --  1.1  --   HEPARINUNFRC 0.55 0.39 0.33  CREATININE 1.17 1.05  --      Estimated Creatinine Clearance: 70.5 mL/min (by C-G formula based on SCr of 1.05 mg/dL).  Assessment: 66 year old male on IV heparin for ACS. Pt s/p LHC with mvCAD, planning for CABG consult.  Heparin level therapeutic at 0.33, CBC stable. CABG tomorrow/  Goal of Therapy:  Heparin level 0.3-0.7 units/ml Monitor platelets by anticoagulation protocol: Yes   Plan:  Continue heparin 1300 units/h Daily heparin level and CBC   Arrie Senate, PharmD, [place], [facility] Clinical Pharmacist [phone] Please check AMION for all [facility] Pharmacy numbers 10/25/2021

## 2021-10-25 NOTE — Progress Notes (Signed)
2 Days Post-Op Procedure(s) (LRB): LEFT HEART CATH AND CORONARY ANGIOGRAPHY (N/A) Subjective: No chest pain Preop PFTs satisfactory Plan CABG in am  Objective: Vital signs in last 24 hours: Temp:  [97.9 F (36.6 C)-98.3 F (36.8 C)] 97.9 F (36.6 C) (02/08 1337) Pulse Rate:  [61-70] 70 (02/08 1337) Cardiac Rhythm: Normal sinus rhythm (02/08 0910) Resp:  [13-16] 13 (02/08 1337) BP: (103-121)/(65-84) 121/84 (02/08 1337) SpO2:  [96 %] 96 % (02/08 1337)  Hemodynamic parameters for last 24 hours:  nsr  Intake/Output from previous day: 02/07 0701 - 02/08 0700 In: 290.3 [I.V.:290.3] Out: 500 [Urine:500] Intake/Output this shift: No intake/output data recorded.       Exam    General- alert and comfortable    Neck- no JVD, no cervical adenopathy palpable, no carotid bruit   Lungs- clear without rales, wheezes   Cor- regular rate and rhythm, no murmur , gallop   Abdomen- soft, non-tender   Extremities - warm, non-tender, minimal edema   Neuro- oriented, appropriate, no focal weakness   Lab Results: Recent Labs    10/24/21 0555 10/25/21 0457  WBC 4.9 5.6  HGB 13.3 14.4  HCT 37.9* 43.4  PLT 183 176   BMET:  Recent Labs    10/24/21 0555 10/25/21 0457  NA 137 137  K 4.0 4.4  CL 104 107  CO2 26 18*  GLUCOSE 85 82  BUN 15 11  CREATININE 1.05 1.07  CALCIUM 8.8* 9.1    PT/INR:  Recent Labs    10/24/21 0555  LABPROT 14.4  INR 1.1   ABG    Component Value Date/Time   PHART 7.427 10/24/2021 0728   HCO3 25.2 10/24/2021 0728   TCO2 25 02/08/2012 0008   O2SAT 95.6 10/24/2021 0728   CBG (last 3)  No results for input(s): GLUCAP in the last 72 hours.  Assessment/Plan: S/P Procedure(s) (LRB): LEFT HEART CATH AND CORONARY ANGIOGRAPHY (N/A) Patient is ready for CABG in am for L main and 3 vessel CAD with reduced LV function. A questions addressed.   LOS: 4 days    Dahlia Byes 10/25/2021

## 2021-10-26 ENCOUNTER — Inpatient Hospital Stay (HOSPITAL_COMMUNITY): Payer: 59

## 2021-10-26 ENCOUNTER — Inpatient Hospital Stay (HOSPITAL_COMMUNITY): Payer: 59 | Admitting: Anesthesiology

## 2021-10-26 ENCOUNTER — Other Ambulatory Visit: Payer: Self-pay

## 2021-10-26 ENCOUNTER — Inpatient Hospital Stay (HOSPITAL_COMMUNITY)
Admission: EM | Disposition: A | Discharge status: Home / Self Care | Payer: Self-pay | Source: Home / Self Care | Attending: Cardiothoracic Surgery

## 2021-10-26 DIAGNOSIS — I2510 Atherosclerotic heart disease of native coronary artery without angina pectoris: Secondary | ICD-10-CM

## 2021-10-26 DIAGNOSIS — Z951 Presence of aortocoronary bypass graft: Secondary | ICD-10-CM

## 2021-10-26 DIAGNOSIS — I252 Old myocardial infarction: Secondary | ICD-10-CM

## 2021-10-26 DIAGNOSIS — I10 Essential (primary) hypertension: Secondary | ICD-10-CM

## 2021-10-26 HISTORY — PX: CORONARY ARTERY BYPASS GRAFT: SHX141

## 2021-10-26 HISTORY — PX: TEE WITHOUT CARDIOVERSION: SHX5443

## 2021-10-26 HISTORY — DX: Presence of aortocoronary bypass graft: Z95.1

## 2021-10-26 HISTORY — PX: ENDOVEIN HARVEST OF GREATER SAPHENOUS VEIN: SHX5059

## 2021-10-26 LAB — HEMOGLOBIN AND HEMATOCRIT, BLOOD
HCT: 28 % — ABNORMAL LOW (ref 39.0–52.0)
Hemoglobin: 9.3 g/dL — ABNORMAL LOW (ref 13.0–17.0)

## 2021-10-26 LAB — POCT I-STAT 7, (LYTES, BLD GAS, ICA,H+H)
Acid-Base Excess: 0 mmol/L (ref 0.0–2.0)
Acid-Base Excess: 1 mmol/L (ref 0.0–2.0)
Acid-Base Excess: 1 mmol/L (ref 0.0–2.0)
Acid-base deficit: 1 mmol/L (ref 0.0–2.0)
Acid-base deficit: 1 mmol/L (ref 0.0–2.0)
Acid-base deficit: 1 mmol/L (ref 0.0–2.0)
Acid-base deficit: 3 mmol/L — ABNORMAL HIGH (ref 0.0–2.0)
Acid-base deficit: 5 mmol/L — ABNORMAL HIGH (ref 0.0–2.0)
Acid-base deficit: 5 mmol/L — ABNORMAL HIGH (ref 0.0–2.0)
Bicarbonate: 24.5 mmol/L (ref 20.0–28.0)
Bicarbonate: 27.1 mmol/L (ref 20.0–28.0)
Bicarbonate: 27.1 mmol/L (ref 20.0–28.0)
Bicarbonate: 23.2 mmol/L (ref 20.0–28.0)
Bicarbonate: 25.3 mmol/L (ref 20.0–28.0)
Bicarbonate: 23.2 mmol/L (ref 20.0–28.0)
Bicarbonate: 22.6 mmol/L (ref 20.0–28.0)
Bicarbonate: 21.1 mmol/L (ref 20.0–28.0)
Bicarbonate: 21.5 mmol/L (ref 20.0–28.0)
Calcium, Ion: 1.28 mmol/L (ref 1.15–1.40)
Calcium, Ion: 1.05 mmol/L — ABNORMAL LOW (ref 1.15–1.40)
Calcium, Ion: 1.11 mmol/L — ABNORMAL LOW (ref 1.15–1.40)
Calcium, Ion: 1.07 mmol/L — ABNORMAL LOW (ref 1.15–1.40)
Calcium, Ion: 1.06 mmol/L — ABNORMAL LOW (ref 1.15–1.40)
Calcium, Ion: 1.07 mmol/L — ABNORMAL LOW (ref 1.15–1.40)
Calcium, Ion: 1.09 mmol/L — ABNORMAL LOW (ref 1.15–1.40)
Calcium, Ion: 1.13 mmol/L — ABNORMAL LOW (ref 1.15–1.40)
Calcium, Ion: 1.14 mmol/L — ABNORMAL LOW (ref 1.15–1.40)
HCT: 38 % — ABNORMAL LOW (ref 39.0–52.0)
HCT: 30 % — ABNORMAL LOW (ref 39.0–52.0)
HCT: 30 % — ABNORMAL LOW (ref 39.0–52.0)
HCT: 28 % — ABNORMAL LOW (ref 39.0–52.0)
HCT: 27 % — ABNORMAL LOW (ref 39.0–52.0)
HCT: 26 % — ABNORMAL LOW (ref 39.0–52.0)
HCT: 28 % — ABNORMAL LOW (ref 39.0–52.0)
HCT: 27 % — ABNORMAL LOW (ref 39.0–52.0)
HCT: 26 % — ABNORMAL LOW (ref 39.0–52.0)
Hemoglobin: 12.9 g/dL — ABNORMAL LOW (ref 13.0–17.0)
Hemoglobin: 10.2 g/dL — ABNORMAL LOW (ref 13.0–17.0)
Hemoglobin: 10.2 g/dL — ABNORMAL LOW (ref 13.0–17.0)
Hemoglobin: 9.5 g/dL — ABNORMAL LOW (ref 13.0–17.0)
Hemoglobin: 9.2 g/dL — ABNORMAL LOW (ref 13.0–17.0)
Hemoglobin: 8.8 g/dL — ABNORMAL LOW (ref 13.0–17.0)
Hemoglobin: 9.5 g/dL — ABNORMAL LOW (ref 13.0–17.0)
Hemoglobin: 9.2 g/dL — ABNORMAL LOW (ref 13.0–17.0)
Hemoglobin: 8.8 g/dL — ABNORMAL LOW (ref 13.0–17.0)
O2 Saturation: 100 %
O2 Saturation: 100 %
O2 Saturation: 100 %
O2 Saturation: 100 %
O2 Saturation: 100 %
O2 Saturation: 98 %
O2 Saturation: 99 %
O2 Saturation: 98 %
O2 Saturation: 98 %
Patient temperature: 36
Patient temperature: 36.6
Patient temperature: 36.9
Potassium: 3.9 mmol/L (ref 3.5–5.1)
Potassium: 4.7 mmol/L (ref 3.5–5.1)
Potassium: 4.9 mmol/L (ref 3.5–5.1)
Potassium: 4.3 mmol/L (ref 3.5–5.1)
Potassium: 3.8 mmol/L (ref 3.5–5.1)
Potassium: 3.6 mmol/L (ref 3.5–5.1)
Potassium: 3.8 mmol/L (ref 3.5–5.1)
Potassium: 4.3 mmol/L (ref 3.5–5.1)
Potassium: 4.2 mmol/L (ref 3.5–5.1)
Sodium: 139 mmol/L (ref 135–145)
Sodium: 140 mmol/L (ref 135–145)
Sodium: 139 mmol/L (ref 135–145)
Sodium: 140 mmol/L (ref 135–145)
Sodium: 138 mmol/L (ref 135–145)
Sodium: 140 mmol/L (ref 135–145)
Sodium: 140 mmol/L (ref 135–145)
Sodium: 138 mmol/L (ref 135–145)
Sodium: 139 mmol/L (ref 135–145)
TCO2: 26 mmol/L (ref 22–32)
TCO2: 29 mmol/L (ref 22–32)
TCO2: 29 mmol/L (ref 22–32)
TCO2: 24 mmol/L (ref 22–32)
TCO2: 26 mmol/L (ref 22–32)
TCO2: 24 mmol/L (ref 22–32)
TCO2: 24 mmol/L (ref 22–32)
TCO2: 22 mmol/L (ref 22–32)
TCO2: 23 mmol/L (ref 22–32)
pCO2 arterial: 42 mmHg (ref 32.0–48.0)
pCO2 arterial: 57 mmHg — ABNORMAL HIGH (ref 32.0–48.0)
pCO2 arterial: 49.8 mmHg — ABNORMAL HIGH (ref 32.0–48.0)
pCO2 arterial: 35.1 mmHg (ref 32.0–48.0)
pCO2 arterial: 38.1 mmHg (ref 32.0–48.0)
pCO2 arterial: 37.6 mmHg (ref 32.0–48.0)
pCO2 arterial: 38.2 mmHg (ref 32.0–48.0)
pCO2 arterial: 41.9 mmHg (ref 32.0–48.0)
pCO2 arterial: 45.2 mmHg (ref 32.0–48.0)
pH, Arterial: 7.375 (ref 7.350–7.450)
pH, Arterial: 7.285 — ABNORMAL LOW (ref 7.350–7.450)
pH, Arterial: 7.344 — ABNORMAL LOW (ref 7.350–7.450)
pH, Arterial: 7.428 (ref 7.350–7.450)
pH, Arterial: 7.431 (ref 7.350–7.450)
pH, Arterial: 7.398 (ref 7.350–7.450)
pH, Arterial: 7.375 (ref 7.350–7.450)
pH, Arterial: 7.308 — ABNORMAL LOW (ref 7.350–7.450)
pH, Arterial: 7.286 — ABNORMAL LOW (ref 7.350–7.450)
pO2, Arterial: 262 mmHg — ABNORMAL HIGH (ref 83.0–108.0)
pO2, Arterial: 367 mmHg — ABNORMAL HIGH (ref 83.0–108.0)
pO2, Arterial: 362 mmHg — ABNORMAL HIGH (ref 83.0–108.0)
pO2, Arterial: 316 mmHg — ABNORMAL HIGH (ref 83.0–108.0)
pO2, Arterial: 356 mmHg — ABNORMAL HIGH (ref 83.0–108.0)
pO2, Arterial: 114 mmHg — ABNORMAL HIGH (ref 83.0–108.0)
pO2, Arterial: 154 mmHg — ABNORMAL HIGH (ref 83.0–108.0)
pO2, Arterial: 106 mmHg (ref 83.0–108.0)
pO2, Arterial: 109 mmHg — ABNORMAL HIGH (ref 83.0–108.0)

## 2021-10-26 LAB — POCT I-STAT, CHEM 8
BUN: 8 mg/dL (ref 8–23)
BUN: 9 mg/dL (ref 8–23)
BUN: 8 mg/dL (ref 8–23)
BUN: 8 mg/dL (ref 8–23)
BUN: 8 mg/dL (ref 8–23)
BUN: 7 mg/dL — ABNORMAL LOW (ref 8–23)
Calcium, Ion: 1.26 mmol/L (ref 1.15–1.40)
Calcium, Ion: 1.29 mmol/L (ref 1.15–1.40)
Calcium, Ion: 1.05 mmol/L — ABNORMAL LOW (ref 1.15–1.40)
Calcium, Ion: 1.07 mmol/L — ABNORMAL LOW (ref 1.15–1.40)
Calcium, Ion: 1.05 mmol/L — ABNORMAL LOW (ref 1.15–1.40)
Calcium, Ion: 1.06 mmol/L — ABNORMAL LOW (ref 1.15–1.40)
Chloride: 105 mmol/L (ref 98–111)
Chloride: 104 mmol/L (ref 98–111)
Chloride: 102 mmol/L (ref 98–111)
Chloride: 104 mmol/L (ref 98–111)
Chloride: 102 mmol/L (ref 98–111)
Chloride: 102 mmol/L (ref 98–111)
Creatinine, Ser: 1 mg/dL (ref 0.61–1.24)
Creatinine, Ser: 1 mg/dL (ref 0.61–1.24)
Creatinine, Ser: 0.8 mg/dL (ref 0.61–1.24)
Creatinine, Ser: 0.9 mg/dL (ref 0.61–1.24)
Creatinine, Ser: 0.9 mg/dL (ref 0.61–1.24)
Creatinine, Ser: 0.9 mg/dL (ref 0.61–1.24)
Glucose, Bld: 86 mg/dL (ref 70–99)
Glucose, Bld: 107 mg/dL — ABNORMAL HIGH (ref 70–99)
Glucose, Bld: 111 mg/dL — ABNORMAL HIGH (ref 70–99)
Glucose, Bld: 124 mg/dL — ABNORMAL HIGH (ref 70–99)
Glucose, Bld: 115 mg/dL — ABNORMAL HIGH (ref 70–99)
Glucose, Bld: 123 mg/dL — ABNORMAL HIGH (ref 70–99)
HCT: 40 % (ref 39.0–52.0)
HCT: 39 % (ref 39.0–52.0)
HCT: 30 % — ABNORMAL LOW (ref 39.0–52.0)
HCT: 30 % — ABNORMAL LOW (ref 39.0–52.0)
HCT: 28 % — ABNORMAL LOW (ref 39.0–52.0)
HCT: 29 % — ABNORMAL LOW (ref 39.0–52.0)
Hemoglobin: 13.6 g/dL (ref 13.0–17.0)
Hemoglobin: 13.3 g/dL (ref 13.0–17.0)
Hemoglobin: 10.2 g/dL — ABNORMAL LOW (ref 13.0–17.0)
Hemoglobin: 10.2 g/dL — ABNORMAL LOW (ref 13.0–17.0)
Hemoglobin: 9.5 g/dL — ABNORMAL LOW (ref 13.0–17.0)
Hemoglobin: 9.9 g/dL — ABNORMAL LOW (ref 13.0–17.0)
Potassium: 3.9 mmol/L (ref 3.5–5.1)
Potassium: 4.1 mmol/L (ref 3.5–5.1)
Potassium: 4 mmol/L (ref 3.5–5.1)
Potassium: 4 mmol/L (ref 3.5–5.1)
Potassium: 4 mmol/L (ref 3.5–5.1)
Potassium: 3.6 mmol/L (ref 3.5–5.1)
Sodium: 139 mmol/L (ref 135–145)
Sodium: 139 mmol/L (ref 135–145)
Sodium: 139 mmol/L (ref 135–145)
Sodium: 139 mmol/L (ref 135–145)
Sodium: 137 mmol/L (ref 135–145)
Sodium: 139 mmol/L (ref 135–145)
TCO2: 26 mmol/L (ref 22–32)
TCO2: 28 mmol/L (ref 22–32)
TCO2: 28 mmol/L (ref 22–32)
TCO2: 27 mmol/L (ref 22–32)
TCO2: 25 mmol/L (ref 22–32)
TCO2: 23 mmol/L (ref 22–32)

## 2021-10-26 LAB — CBC
HCT: 43.4 % (ref 39.0–52.0)
HCT: 29.7 % — ABNORMAL LOW (ref 39.0–52.0)
HCT: 27.9 % — ABNORMAL LOW (ref 39.0–52.0)
Hemoglobin: 15 g/dL (ref 13.0–17.0)
Hemoglobin: 10.4 g/dL — ABNORMAL LOW (ref 13.0–17.0)
Hemoglobin: 9.5 g/dL — ABNORMAL LOW (ref 13.0–17.0)
MCH: 32.5 pg (ref 26.0–34.0)
MCH: 33.4 pg (ref 26.0–34.0)
MCH: 32.5 pg (ref 26.0–34.0)
MCHC: 34.6 g/dL (ref 30.0–36.0)
MCHC: 35 g/dL (ref 30.0–36.0)
MCHC: 34.1 g/dL (ref 30.0–36.0)
MCV: 93.9 fL (ref 80.0–100.0)
MCV: 95.5 fL (ref 80.0–100.0)
MCV: 95.5 fL (ref 80.0–100.0)
Platelets: 191 10*3/uL (ref 150–400)
Platelets: 107 10*3/uL — ABNORMAL LOW (ref 150–400)
Platelets: 131 10*3/uL — ABNORMAL LOW (ref 150–400)
RBC: 4.62 MIL/uL (ref 4.22–5.81)
RBC: 3.11 MIL/uL — ABNORMAL LOW (ref 4.22–5.81)
RBC: 2.92 MIL/uL — ABNORMAL LOW (ref 4.22–5.81)
RDW: 12.6 % (ref 11.5–15.5)
RDW: 12.5 % (ref 11.5–15.5)
RDW: 12.5 % (ref 11.5–15.5)
WBC: 5.7 10*3/uL (ref 4.0–10.5)
WBC: 13.6 10*3/uL — ABNORMAL HIGH (ref 4.0–10.5)
WBC: 12.9 10*3/uL — ABNORMAL HIGH (ref 4.0–10.5)
nRBC: 0 % (ref 0.0–0.2)
nRBC: 0 % (ref 0.0–0.2)
nRBC: 0 % (ref 0.0–0.2)

## 2021-10-26 LAB — POCT I-STAT EG7
Acid-base deficit: 1 mmol/L (ref 0.0–2.0)
Bicarbonate: 26.4 mmol/L (ref 20.0–28.0)
Calcium, Ion: 1.1 mmol/L — ABNORMAL LOW (ref 1.15–1.40)
HCT: 32 % — ABNORMAL LOW (ref 39.0–52.0)
Hemoglobin: 10.9 g/dL — ABNORMAL LOW (ref 13.0–17.0)
O2 Saturation: 80 %
Potassium: 4.2 mmol/L (ref 3.5–5.1)
Sodium: 141 mmol/L (ref 135–145)
TCO2: 28 mmol/L (ref 22–32)
pCO2, Ven: 56.4 mmHg (ref 44.0–60.0)
pH, Ven: 7.278 (ref 7.250–7.430)
pO2, Ven: 51 mmHg — ABNORMAL HIGH (ref 32.0–45.0)

## 2021-10-26 LAB — BASIC METABOLIC PANEL
Anion gap: 6 (ref 5–15)
BUN: 10 mg/dL (ref 8–23)
CO2: 23 mmol/L (ref 22–32)
Calcium: 9.4 mg/dL (ref 8.9–10.3)
Chloride: 107 mmol/L (ref 98–111)
Creatinine, Ser: 1.2 mg/dL (ref 0.61–1.24)
GFR, Estimated: >60 mL/min (ref >60)
Glucose, Bld: 90 mg/dL (ref 70–99)
Potassium: 4 mmol/L (ref 3.5–5.1)
Sodium: 136 mmol/L (ref 135–145)

## 2021-10-26 LAB — COOXEMETRY PANEL
Carboxyhemoglobin: 1 % (ref 0.5–1.5)
Methemoglobin: 0.5 % (ref 0.0–1.5)
O2 Saturation: 57.5 %
Total hemoglobin: 10.1 g/dL — ABNORMAL LOW (ref 12.0–16.0)

## 2021-10-26 LAB — GLUCOSE, CAPILLARY
Glucose-Capillary: 118 mg/dL — ABNORMAL HIGH (ref 70–99)
Glucose-Capillary: 120 mg/dL — ABNORMAL HIGH (ref 70–99)
Glucose-Capillary: 124 mg/dL — ABNORMAL HIGH (ref 70–99)
Glucose-Capillary: 137 mg/dL — ABNORMAL HIGH (ref 70–99)
Glucose-Capillary: 150 mg/dL — ABNORMAL HIGH (ref 70–99)
Glucose-Capillary: 147 mg/dL — ABNORMAL HIGH (ref 70–99)

## 2021-10-26 LAB — PROTIME-INR
INR: 1.5 — ABNORMAL HIGH (ref 0.8–1.2)
Prothrombin Time: 18.2 seconds — ABNORMAL HIGH (ref 11.4–15.2)

## 2021-10-26 LAB — HEPARIN LEVEL (UNFRACTIONATED): Heparin Unfractionated: 0.51 IU/mL (ref 0.30–0.70)

## 2021-10-26 LAB — APTT: aPTT: 36 seconds (ref 24–36)

## 2021-10-26 LAB — PLATELET COUNT: Platelets: 127 10*3/uL — ABNORMAL LOW (ref 150–400)

## 2021-10-26 LAB — MAGNESIUM: Magnesium: 2.6 mg/dL — ABNORMAL HIGH (ref 1.7–2.4)

## 2021-10-26 SURGERY — CORONARY ARTERY BYPASS GRAFTING (CABG)
Anesthesia: General | Site: Leg Upper | Laterality: Right

## 2021-10-26 MED ORDER — ACETAMINOPHEN 160 MG/5ML PO SOLN: 1000.0000 mg | Freq: Four times a day (QID) | ORAL | Status: DC

## 2021-10-26 MED ORDER — PROPOFOL 10 MG/ML IV BOLUS
INTRAVENOUS | Status: AC
  Filled 2021-10-26: qty 20

## 2021-10-26 MED ORDER — ONDANSETRON HCL 4 MG/2ML IJ SOLN: 4.0000 mg | Freq: Four times a day (QID) | INTRAMUSCULAR | Status: DC | PRN

## 2021-10-26 MED ORDER — PROPOFOL 10 MG/ML IV BOLUS
INTRAVENOUS | Status: DC | PRN
  Administered 2021-10-26: 40 mg via INTRAVENOUS
  Administered 2021-10-26: 20 mg via INTRAVENOUS
  Administered 2021-10-26: 50 mg via INTRAVENOUS

## 2021-10-26 MED ORDER — FENTANYL CITRATE (PF) 250 MCG/5ML IJ SOLN
INTRAMUSCULAR | Status: DC | PRN
  Administered 2021-10-26 (×4): 50 ug via INTRAVENOUS
  Administered 2021-10-26: 100 ug via INTRAVENOUS
  Administered 2021-10-26: 50 ug via INTRAVENOUS
  Administered 2021-10-26 (×2): 100 ug via INTRAVENOUS
  Administered 2021-10-26: 50 ug via INTRAVENOUS
  Administered 2021-10-26: 200 ug via INTRAVENOUS

## 2021-10-26 MED ORDER — AMIODARONE IV BOLUS ONLY 150 MG/100ML
150.0000 mg | Freq: Once | INTRAVENOUS | Status: AC
  Administered 2021-10-26: 150 mg via INTRAVENOUS
  Filled 2021-10-26: qty 100

## 2021-10-26 MED ORDER — PROTAMINE SULFATE 10 MG/ML IV SOLN
INTRAVENOUS | Status: AC
  Filled 2021-10-26: qty 25

## 2021-10-26 MED ORDER — LACTATED RINGERS IV SOLN
500.0000 mL | Freq: Once | INTRAVENOUS | Status: AC | PRN
  Administered 2021-10-26: 500 mL via INTRAVENOUS

## 2021-10-26 MED ORDER — FENTANYL CITRATE (PF) 250 MCG/5ML IJ SOLN
INTRAMUSCULAR | Status: AC
  Filled 2021-10-26: qty 5

## 2021-10-26 MED ORDER — SODIUM CHLORIDE 0.9% FLUSH: 3.0000 mL | INTRAVENOUS | Status: DC | PRN

## 2021-10-26 MED ORDER — PANTOPRAZOLE SODIUM 40 MG PO TBEC
40.0000 mg | DELAYED_RELEASE_TABLET | Freq: Every day | ORAL | Status: DC
  Administered 2021-10-28 – 2021-10-30 (×3): 40 mg via ORAL
  Filled 2021-10-26 (×3): qty 1

## 2021-10-26 MED ORDER — ROCURONIUM BROMIDE 10 MG/ML (PF) SYRINGE
PREFILLED_SYRINGE | INTRAVENOUS | Status: DC | PRN
Start: 2021-10-26 — End: 2021-10-26
  Administered 2021-10-26: 50 mg via INTRAVENOUS
  Administered 2021-10-26 (×2): 20 mg via INTRAVENOUS
  Administered 2021-10-26: 50 mg via INTRAVENOUS
  Administered 2021-10-26: 80 mg via INTRAVENOUS

## 2021-10-26 MED ORDER — SODIUM CHLORIDE 0.9 % IV SOLN
20.0000 ug | Freq: Once | INTRAVENOUS | Status: AC
  Administered 2021-10-26: 20 ug via INTRAVENOUS
  Filled 2021-10-26: qty 5

## 2021-10-26 MED ORDER — ACETAMINOPHEN 650 MG RE SUPP
650.0000 mg | Freq: Once | RECTAL | Status: AC
  Administered 2021-10-26: 650 mg via RECTAL

## 2021-10-26 MED ORDER — BISACODYL 5 MG PO TBEC
10.0000 mg | DELAYED_RELEASE_TABLET | Freq: Every day | ORAL | Status: DC
  Administered 2021-10-27 – 2021-10-30 (×4): 10 mg via ORAL
  Filled 2021-10-26 (×4): qty 2

## 2021-10-26 MED ORDER — 0.9 % SODIUM CHLORIDE (POUR BTL) OPTIME
TOPICAL | Status: DC | PRN
Start: 2021-10-26 — End: 2021-10-26
  Administered 2021-10-26: 5000 mL

## 2021-10-26 MED ORDER — CHLORHEXIDINE GLUCONATE 0.12 % MT SOLN
15.0000 mL | OROMUCOSAL | Status: AC
  Administered 2021-10-26: 15 mL via OROMUCOSAL

## 2021-10-26 MED ORDER — HEMOSTATIC AGENTS (NO CHARGE) OPTIME
TOPICAL | Status: DC | PRN
  Administered 2021-10-26 (×4): 1 via TOPICAL

## 2021-10-26 MED ORDER — MIDAZOLAM HCL (PF) 5 MG/ML IJ SOLN
INTRAMUSCULAR | Status: DC | PRN
  Administered 2021-10-26: 3 mg via INTRAVENOUS
  Administered 2021-10-26: 2 mg via INTRAVENOUS
  Administered 2021-10-26: 1 mg via INTRAVENOUS
  Administered 2021-10-26 (×2): 2 mg via INTRAVENOUS

## 2021-10-26 MED ORDER — DEXMEDETOMIDINE HCL IN NACL 400 MCG/100ML IV SOLN
0.0000 ug/kg/h | INTRAVENOUS | Status: DC
  Administered 2021-10-26: 0.7 ug/kg/h via INTRAVENOUS
  Filled 2021-10-26: qty 100

## 2021-10-26 MED ORDER — LACTATED RINGERS IV SOLN: INTRAVENOUS | Status: DC | PRN

## 2021-10-26 MED ORDER — OXYCODONE HCL 5 MG PO TABS: 5.0000 mg | ORAL_TABLET | ORAL | Status: DC | PRN

## 2021-10-26 MED ORDER — HEPARIN SODIUM (PORCINE) 1000 UNIT/ML IJ SOLN
INTRAMUSCULAR | Status: AC
  Filled 2021-10-26: qty 1

## 2021-10-26 MED ORDER — PLASMA-LYTE A IV SOLN
INTRAVENOUS | Status: DC | PRN
  Administered 2021-10-26: 500 mL via INTRAVASCULAR

## 2021-10-26 MED ORDER — ASPIRIN 81 MG PO CHEW: 324.0000 mg | CHEWABLE_TABLET | Freq: Every day | ORAL | Status: DC

## 2021-10-26 MED ORDER — MIDAZOLAM HCL 2 MG/2ML IJ SOLN: 2.0000 mg | INTRAMUSCULAR | Status: DC | PRN

## 2021-10-26 MED ORDER — SODIUM CHLORIDE (PF) 0.9 % IJ SOLN
INTRAMUSCULAR | Status: AC
  Filled 2021-10-26: qty 20

## 2021-10-26 MED ORDER — PHENYLEPHRINE HCL-NACL 20-0.9 MG/250ML-% IV SOLN
0.0000 ug/min | INTRAVENOUS | Status: DC
  Administered 2021-10-26: 75 ug/min via INTRAVENOUS
  Administered 2021-10-27: 80 ug/min via INTRAVENOUS
  Administered 2021-10-27: 90 ug/min via INTRAVENOUS
  Filled 2021-10-26 (×3): qty 250

## 2021-10-26 MED ORDER — LACTATED RINGERS IV SOLN: INTRAVENOUS | Status: DC

## 2021-10-26 MED ORDER — ACETAMINOPHEN 500 MG PO TABS
1000.0000 mg | ORAL_TABLET | Freq: Four times a day (QID) | ORAL | Status: DC
  Administered 2021-10-26 – 2021-10-29 (×8): 1000 mg via ORAL
  Filled 2021-10-26 (×8): qty 2

## 2021-10-26 MED ORDER — FAMOTIDINE IN NACL 20-0.9 MG/50ML-% IV SOLN: 20.0000 mg | Freq: Two times a day (BID) | INTRAVENOUS | Status: DC

## 2021-10-26 MED ORDER — CHLORHEXIDINE GLUCONATE CLOTH 2 % EX PADS
6.0000 | MEDICATED_PAD | Freq: Every day | CUTANEOUS | Status: DC
  Administered 2021-10-26 – 2021-10-27 (×2): 6 via TOPICAL

## 2021-10-26 MED ORDER — MILRINONE LACTATE IN DEXTROSE 20-5 MG/100ML-% IV SOLN
0.1250 ug/kg/min | INTRAVENOUS | Status: DC
  Administered 2021-10-27: 0.25 ug/kg/min via INTRAVENOUS
  Filled 2021-10-26 (×2): qty 100

## 2021-10-26 MED ORDER — AMIODARONE HCL IN DEXTROSE 360-4.14 MG/200ML-% IV SOLN
60.0000 mg/h | INTRAVENOUS | Status: DC
  Administered 2021-10-26: 60 mg/h via INTRAVENOUS
  Filled 2021-10-26: qty 200

## 2021-10-26 MED ORDER — ASPIRIN EC 325 MG PO TBEC
325.0000 mg | DELAYED_RELEASE_TABLET | Freq: Every day | ORAL | Status: DC
  Administered 2021-10-27 – 2021-10-30 (×4): 325 mg via ORAL
  Filled 2021-10-26 (×4): qty 1

## 2021-10-26 MED ORDER — METOPROLOL TARTRATE 25 MG/10 ML ORAL SUSPENSION: 12.5000 mg | Freq: Two times a day (BID) | ORAL | Status: DC

## 2021-10-26 MED ORDER — ACETAMINOPHEN 160 MG/5ML PO SOLN: 650.0000 mg | Freq: Once | ORAL | Status: AC

## 2021-10-26 MED ORDER — HEPARIN SODIUM (PORCINE) 1000 UNIT/ML IJ SOLN: INTRAMUSCULAR | Status: DC | PRN

## 2021-10-26 MED ORDER — MAGNESIUM SULFATE 4 GM/100ML IV SOLN
4.0000 g | Freq: Once | INTRAVENOUS | Status: AC
  Administered 2021-10-26: 4 g via INTRAVENOUS
  Filled 2021-10-26: qty 100

## 2021-10-26 MED ORDER — PHENYLEPHRINE 40 MCG/ML (10ML) SYRINGE FOR IV PUSH (FOR BLOOD PRESSURE SUPPORT)
PREFILLED_SYRINGE | INTRAVENOUS | Status: DC | PRN
Start: 2021-10-26 — End: 2021-10-26
  Administered 2021-10-26 (×7): 40 ug via INTRAVENOUS

## 2021-10-26 MED ORDER — ALBUMIN HUMAN 5 % IV SOLN: INTRAVENOUS | Status: DC | PRN

## 2021-10-26 MED ORDER — AMIODARONE HCL IN DEXTROSE 360-4.14 MG/200ML-% IV SOLN
30.0000 mg/h | INTRAVENOUS | Status: AC
  Administered 2021-10-27 – 2021-10-28 (×2): 30 mg/h via INTRAVENOUS
  Filled 2021-10-26 (×3): qty 200

## 2021-10-26 MED ORDER — ROCURONIUM BROMIDE 10 MG/ML (PF) SYRINGE
PREFILLED_SYRINGE | INTRAVENOUS | Status: AC
  Filled 2021-10-26: qty 10

## 2021-10-26 MED ORDER — BISACODYL 10 MG RE SUPP: 10.0000 mg | Freq: Every day | RECTAL | Status: DC

## 2021-10-26 MED ORDER — SODIUM CHLORIDE 0.9 % IV SOLN: 250.0000 mL | INTRAVENOUS | Status: DC

## 2021-10-26 MED ORDER — HEPARIN SODIUM (PORCINE) 1000 UNIT/ML IJ SOLN
INTRAMUSCULAR | Status: DC | PRN
  Administered 2021-10-26: 28000 [IU] via INTRAVENOUS
  Administered 2021-10-26: 3000 [IU] via INTRAVENOUS

## 2021-10-26 MED ORDER — PROTAMINE SULFATE 10 MG/ML IV SOLN
INTRAVENOUS | Status: DC | PRN
Start: 2021-10-26 — End: 2021-10-26
  Administered 2021-10-26: 300 mg via INTRAVENOUS

## 2021-10-26 MED ORDER — MORPHINE SULFATE (PF) 2 MG/ML IV SOLN
1.0000 mg | INTRAVENOUS | Status: DC | PRN
  Administered 2021-10-27 (×2): 2 mg via INTRAVENOUS
  Filled 2021-10-26 (×2): qty 1

## 2021-10-26 MED ORDER — DEXTROSE 50 % IV SOLN: 0.0000 mL | INTRAVENOUS | Status: DC | PRN

## 2021-10-26 MED ORDER — METOPROLOL TARTRATE 12.5 MG HALF TABLET
12.5000 mg | ORAL_TABLET | Freq: Two times a day (BID) | ORAL | Status: DC
  Administered 2021-10-28 – 2021-11-01 (×9): 12.5 mg via ORAL
  Filled 2021-10-26 (×9): qty 1

## 2021-10-26 MED ORDER — ORAL CARE MOUTH RINSE
15.0000 mL | Freq: Two times a day (BID) | OROMUCOSAL | Status: DC
  Administered 2021-10-27 – 2021-10-30 (×5): 15 mL via OROMUCOSAL

## 2021-10-26 MED ORDER — MIDAZOLAM HCL (PF) 10 MG/2ML IJ SOLN
INTRAMUSCULAR | Status: AC
  Filled 2021-10-26: qty 2

## 2021-10-26 MED ORDER — GELATIN ABSORBABLE MT POWD
OROMUCOSAL | Status: DC | PRN
  Administered 2021-10-26 (×3): 4 mL via TOPICAL

## 2021-10-26 MED ORDER — POTASSIUM CHLORIDE 10 MEQ/50ML IV SOLN
10.0000 meq | INTRAVENOUS | Status: AC
  Administered 2021-10-26 (×3): 10 meq via INTRAVENOUS

## 2021-10-26 MED ORDER — INSULIN REGULAR(HUMAN) IN NACL 100-0.9 UT/100ML-% IV SOLN: INTRAVENOUS | Status: DC

## 2021-10-26 MED ORDER — NITROGLYCERIN IN D5W 200-5 MCG/ML-% IV SOLN: 0.0000 ug/min | INTRAVENOUS | Status: DC

## 2021-10-26 MED ORDER — TRAMADOL HCL 50 MG PO TABS
50.0000 mg | ORAL_TABLET | ORAL | Status: DC | PRN
  Administered 2021-10-27: 50 mg via ORAL
  Filled 2021-10-26: qty 1

## 2021-10-26 MED ORDER — SODIUM CHLORIDE 0.45 % IV SOLN: INTRAVENOUS | Status: DC | PRN

## 2021-10-26 MED ORDER — ALBUMIN HUMAN 5 % IV SOLN
250.0000 mL | INTRAVENOUS | Status: AC | PRN
  Administered 2021-10-26 (×2): 12.5 g via INTRAVENOUS
  Filled 2021-10-26: qty 250

## 2021-10-26 MED ORDER — SODIUM CHLORIDE 0.9 % IV SOLN: INTRAVENOUS | Status: DC

## 2021-10-26 MED ORDER — EPHEDRINE SULFATE-NACL 50-0.9 MG/10ML-% IV SOSY
PREFILLED_SYRINGE | INTRAVENOUS | Status: DC | PRN
Start: 2021-10-26 — End: 2021-10-26
  Administered 2021-10-26 (×4): 5 mg via INTRAVENOUS

## 2021-10-26 MED ORDER — CEFAZOLIN SODIUM-DEXTROSE 2-4 GM/100ML-% IV SOLN
2.0000 g | Freq: Three times a day (TID) | INTRAVENOUS | Status: AC
  Administered 2021-10-26 – 2021-10-28 (×6): 2 g via INTRAVENOUS
  Filled 2021-10-26 (×6): qty 100

## 2021-10-26 MED ORDER — CHLORHEXIDINE GLUCONATE 0.12% ORAL RINSE (MEDLINE KIT)
15.0000 mL | Freq: Two times a day (BID) | OROMUCOSAL | Status: DC
  Administered 2021-10-26: 15 mL via OROMUCOSAL

## 2021-10-26 MED ORDER — SODIUM CHLORIDE 0.9% FLUSH
3.0000 mL | Freq: Two times a day (BID) | INTRAVENOUS | Status: DC
  Administered 2021-10-27 – 2021-10-30 (×6): 3 mL via INTRAVENOUS

## 2021-10-26 MED ORDER — PROTAMINE SULFATE 10 MG/ML IV SOLN
INTRAVENOUS | Status: AC
  Filled 2021-10-26: qty 5

## 2021-10-26 MED ORDER — LACTATED RINGERS IV SOLN
INTRAVENOUS | Status: DC | PRN
Start: 2021-10-26 — End: 2021-10-26

## 2021-10-26 MED ORDER — ORAL CARE MOUTH RINSE
15.0000 mL | OROMUCOSAL | Status: DC
  Administered 2021-10-26: 15 mL via OROMUCOSAL

## 2021-10-26 MED ORDER — METOPROLOL TARTRATE 5 MG/5ML IV SOLN: 2.5000 mg | INTRAVENOUS | Status: DC | PRN

## 2021-10-26 MED ORDER — VANCOMYCIN HCL IN DEXTROSE 1-5 GM/200ML-% IV SOLN
1000.0000 mg | Freq: Once | INTRAVENOUS | Status: AC
  Administered 2021-10-26: 1000 mg via INTRAVENOUS
  Filled 2021-10-26: qty 200

## 2021-10-26 MED ORDER — DOCUSATE SODIUM 100 MG PO CAPS
200.0000 mg | ORAL_CAPSULE | Freq: Every day | ORAL | Status: DC
  Administered 2021-10-27 – 2021-10-30 (×4): 200 mg via ORAL
  Filled 2021-10-26 (×4): qty 2

## 2021-10-26 SURGICAL SUPPLY — 98 items
ADAPTER CARDIO PERF ANTE/RETRO (ADAPTER) ×5 IMPLANT
BAG DECANTER FOR FLEXI CONT (MISCELLANEOUS) ×5 IMPLANT
BLADE CLIPPER SURG (BLADE) ×5 IMPLANT
BLADE STERNUM SYSTEM 6 (BLADE) ×5 IMPLANT
BLADE SURG 12 STRL SS (BLADE) ×5 IMPLANT
BNDG ELASTIC 4X5.8 VLCR STR LF (GAUZE/BANDAGES/DRESSINGS) ×5 IMPLANT
BNDG ELASTIC 6X10 VLCR STRL LF (GAUZE/BANDAGES/DRESSINGS) ×1 IMPLANT
BNDG ELASTIC 6X5.8 VLCR STR LF (GAUZE/BANDAGES/DRESSINGS) ×5 IMPLANT
BNDG GAUZE ELAST 4 BULKY (GAUZE/BANDAGES/DRESSINGS) ×5 IMPLANT
CANISTER SUCT 3000ML PPV (MISCELLANEOUS) ×5 IMPLANT
CANNULA GUNDRY RCSP 15FR (MISCELLANEOUS) ×5 IMPLANT
CATH CPB KIT VANTRIGT (MISCELLANEOUS) ×5 IMPLANT
CATH ROBINSON RED A/P 18FR (CATHETERS) ×15 IMPLANT
CATH THORACIC 28FR RT ANG (CATHETERS) ×5 IMPLANT
CONTAINER PROTECT SURGISLUSH (MISCELLANEOUS) ×2 IMPLANT
DERMABOND ADHESIVE PROPEN (GAUZE/BANDAGES/DRESSINGS) ×1
DERMABOND ADVANCED (GAUZE/BANDAGES/DRESSINGS) ×1
DERMABOND ADVANCED .7 DNX12 (GAUZE/BANDAGES/DRESSINGS) IMPLANT
DERMABOND ADVANCED .7 DNX6 (GAUZE/BANDAGES/DRESSINGS) IMPLANT
DRAIN CHANNEL 32F RND 10.7 FF (WOUND CARE) ×5 IMPLANT
DRAPE CARDIOVASCULAR INCISE (DRAPES) ×5
DRAPE SLUSH/WARMER DISC (DRAPES) ×5 IMPLANT
DRAPE SRG 135X102X78XABS (DRAPES) ×4 IMPLANT
DRSG AQUACEL AG ADV 3.5X14 (GAUZE/BANDAGES/DRESSINGS) ×5 IMPLANT
ELECT BLADE 4.0 EZ CLEAN MEGAD (MISCELLANEOUS) ×5
ELECT BLADE 6.5 EXT (BLADE) ×5 IMPLANT
ELECT CAUTERY BLADE 6.4 (BLADE) ×5 IMPLANT
ELECT REM PT RETURN 9FT ADLT (ELECTROSURGICAL) ×10
ELECTRODE BLDE 4.0 EZ CLN MEGD (MISCELLANEOUS) ×4 IMPLANT
ELECTRODE REM PT RTRN 9FT ADLT (ELECTROSURGICAL) ×8 IMPLANT
FELT TEFLON 1X6 (MISCELLANEOUS) ×9 IMPLANT
GAUZE 4X4 16PLY ~~LOC~~+RFID DBL (SPONGE) ×1 IMPLANT
GAUZE SPONGE 4X4 12PLY STRL (GAUZE/BANDAGES/DRESSINGS) ×10 IMPLANT
GLOVE SURG ENC MOIS LTX SZ7.5 (GLOVE) ×15 IMPLANT
GLOVE SURG MICRO LTX SZ6 (GLOVE) ×5 IMPLANT
GLOVE SURG MICRO LTX SZ6.5 (GLOVE) ×5 IMPLANT
GLOVE SURG PROTEXIS BL SZ6.5 (GLOVE) ×5 IMPLANT
GLOVE SURG SYN 6.5 PF PI BL (GLOVE) IMPLANT
GOWN STRL REUS W/ TWL LRG LVL3 (GOWN DISPOSABLE) ×16 IMPLANT
GOWN STRL REUS W/TWL LRG LVL3 (GOWN DISPOSABLE) ×40
HEMOSTAT POWDER SURGIFOAM 1G (HEMOSTASIS) ×15 IMPLANT
HEMOSTAT SURGICEL 2X14 (HEMOSTASIS) ×5 IMPLANT
INSERT FOGARTY XLG (MISCELLANEOUS) IMPLANT
KIT BASIN OR (CUSTOM PROCEDURE TRAY) ×5 IMPLANT
KIT SUCTION CATH 14FR (SUCTIONS) ×5 IMPLANT
KIT TURNOVER KIT B (KITS) ×5 IMPLANT
KIT VASOVIEW HEMOPRO 2 VH 4000 (KITS) ×5 IMPLANT
LEAD PACING MYOCARDI (MISCELLANEOUS) ×5 IMPLANT
MARKER GRAFT CORONARY BYPASS (MISCELLANEOUS) ×15 IMPLANT
NS IRRIG 1000ML POUR BTL (IV SOLUTION) ×25 IMPLANT
PACK E OPEN HEART (SUTURE) ×5 IMPLANT
PACK OPEN HEART (CUSTOM PROCEDURE TRAY) ×5 IMPLANT
PAD ARMBOARD 7.5X6 YLW CONV (MISCELLANEOUS) ×10 IMPLANT
PAD ELECT DEFIB RADIOL ZOLL (MISCELLANEOUS) ×5 IMPLANT
PENCIL BUTTON HOLSTER BLD 10FT (ELECTRODE) ×5 IMPLANT
POSITIONER HEAD DONUT 9IN (MISCELLANEOUS) ×5 IMPLANT
POWDER SURGICEL 3.0 GRAM (HEMOSTASIS) ×4 IMPLANT
PUNCH AORTIC ROTATE 4.0MM (MISCELLANEOUS) IMPLANT
PUNCH AORTIC ROTATE 4.5MM 8IN (MISCELLANEOUS) ×1 IMPLANT
PUNCH AORTIC ROTATE 5MM 8IN (MISCELLANEOUS) IMPLANT
SET MPS 3-ND DEL (MISCELLANEOUS) ×1 IMPLANT
SPONGE T-LAP 18X18 ~~LOC~~+RFID (SPONGE) ×6 IMPLANT
SPONGE T-LAP 4X18 ~~LOC~~+RFID (SPONGE) ×1 IMPLANT
SUPPORT HEART JANKE-BARRON (MISCELLANEOUS) ×5 IMPLANT
SURGIFLO W/THROMBIN 8M KIT (HEMOSTASIS) ×5 IMPLANT
SUT BONE WAX W31G (SUTURE) ×5 IMPLANT
SUT MNCRL AB 4-0 PS2 18 (SUTURE) IMPLANT
SUT PROLENE 3 0 SH DA (SUTURE) IMPLANT
SUT PROLENE 3 0 SH1 36 (SUTURE) IMPLANT
SUT PROLENE 4 0 RB 1 (SUTURE) ×5
SUT PROLENE 4 0 SH DA (SUTURE) ×5 IMPLANT
SUT PROLENE 4-0 RB1 .5 CRCL 36 (SUTURE) ×4 IMPLANT
SUT PROLENE 5 0 C 1 36 (SUTURE) IMPLANT
SUT PROLENE 6 0 C 1 30 (SUTURE) ×5 IMPLANT
SUT PROLENE 6 0 CC (SUTURE) ×20 IMPLANT
SUT PROLENE 8 0 BV175 6 (SUTURE) IMPLANT
SUT PROLENE BLUE 7 0 (SUTURE) ×5 IMPLANT
SUT SILK  1 MH (SUTURE)
SUT SILK 1 MH (SUTURE) IMPLANT
SUT SILK 2 0 SH CR/8 (SUTURE) ×1 IMPLANT
SUT SILK 3 0 SH CR/8 (SUTURE) IMPLANT
SUT STEEL 6MS V (SUTURE) ×10 IMPLANT
SUT STEEL SZ 6 DBL 3X14 BALL (SUTURE) ×5 IMPLANT
SUT VIC AB 1 CTX 36 (SUTURE) ×20
SUT VIC AB 1 CTX36XBRD ANBCTR (SUTURE) ×8 IMPLANT
SUT VIC AB 2-0 CT1 27 (SUTURE) ×5
SUT VIC AB 2-0 CT1 TAPERPNT 27 (SUTURE) IMPLANT
SUT VIC AB 2-0 CTX 27 (SUTURE) IMPLANT
SUT VIC AB 3-0 X1 27 (SUTURE) ×1 IMPLANT
SYSTEM SAHARA CHEST DRAIN ATS (WOUND CARE) ×5 IMPLANT
TAPE CLOTH SURG 4X10 WHT LF (GAUZE/BANDAGES/DRESSINGS) ×1 IMPLANT
TAPE PAPER 2X10 WHT MICROPORE (GAUZE/BANDAGES/DRESSINGS) ×1 IMPLANT
TOWEL GREEN STERILE (TOWEL DISPOSABLE) ×5 IMPLANT
TOWEL GREEN STERILE FF (TOWEL DISPOSABLE) ×5 IMPLANT
TRAY FOLEY SLVR 16FR TEMP STAT (SET/KITS/TRAYS/PACK) ×5 IMPLANT
TUBING LAP HI FLOW INSUFFLATIO (TUBING) ×5 IMPLANT
UNDERPAD 30X36 HEAVY ABSORB (UNDERPADS AND DIAPERS) ×5 IMPLANT
WATER STERILE IRR 1000ML POUR (IV SOLUTION) ×10 IMPLANT

## 2021-10-26 NOTE — Anesthesia Procedure Notes (Signed)
Procedure Name: Intubation Date/Time: 10/26/2021 9:15 AM Performed by: Erick Colace, CRNA Pre-anesthesia Checklist: Patient identified, Emergency Drugs available, Suction available and Patient being monitored Patient Re-evaluated:Patient Re-evaluated prior to induction Oxygen Delivery Method: Circle system utilized Preoxygenation: Pre-oxygenation with 100% oxygen Induction Type: IV induction Ventilation: Mask ventilation without difficulty Laryngoscope Size: Mac and 4 Grade View: Grade II Tube type: Oral Tube size: 8.0 mm Number of attempts: 1 Airway Equipment and Method: Stylet and Oral airway Placement Confirmation: ETT inserted through vocal cords under direct vision, positive ETCO2 and breath sounds checked- equal and bilateral Secured at: 22 cm Tube secured with: Tape Dental Injury: Teeth and Oropharynx as per pre-operative assessment

## 2021-10-26 NOTE — Anesthesia Preprocedure Evaluation (Signed)
Anesthesia Evaluation  Patient identified by MRN, date of birth, ID band Patient awake    Reviewed: Allergy & Precautions, NPO status , Patient's Chart, lab work & pertinent test results  History of Anesthesia Complications Negative for: history of anesthetic complications  Airway Mallampati: II  TM Distance: >3 FB Neck ROM: Full    Dental  (+) Dental Advisory Given, Teeth Intact   Pulmonary neg pulmonary ROS,    breath sounds clear to auscultation       Cardiovascular hypertension, Pt. on medications and Pt. on home beta blockers + CAD and + Past MI   Rhythm:Regular  1. Left ventricular ejection fraction, by estimation, is 40 to 45%. The  left ventricle has mildly decreased function. The left ventricle  demonstrates regional wall motion abnormalities (see scoring  diagram/findings for description). There is mild left  ventricular hypertrophy. Left ventricular diastolic parameters are  indeterminate.  2. Right ventricular systolic function is normal. The right ventricular  size is normal. Tricuspid regurgitation signal is inadequate for assessing  PA pressure.  3. The mitral valve is grossly normal. Trivial mitral valve  regurgitation.  4. The aortic valve is tricuspid. Aortic valve regurgitation is not  visualized.  5. The inferior vena cava is dilated in size with >50% respiratory  variability, suggesting right atrial pressure of 8 mmHg.      Prox RCA to Dist RCA lesion is 95% stenosed.   RPAV lesion is 100% stenosed.   RPDA lesion is 95% stenosed.   Ost LAD to Mid LAD lesion is 95% stenosed.   Ost LM lesion is 80% stenosed.   1st Diag lesion is 90% stenosed.   Mid Cx to Dist Cx lesion is 90% stenosed.   Ost Cx to Mid Cx lesion is 90% stenosed.    Neuro/Psych negative neurological ROS  negative psych ROS   GI/Hepatic negative GI ROS, Neg liver ROS,   Endo/Other  negative endocrine ROS   Renal/GU negative Renal ROS     Musculoskeletal negative musculoskeletal ROS (+)   Abdominal   Peds  Hematology negative hematology ROS (+) Lab Results      Component                Value               Date                      WBC                      5.7                 10/26/2021                HGB                      15.0                10/26/2021                HCT                      43.4                10/26/2021                MCV  93.9                10/26/2021                PLT                      191                 10/26/2021              Anesthesia Other Findings   Reproductive/Obstetrics                             Anesthesia Physical Anesthesia Plan  ASA: 4  Anesthesia Plan: General   Post-op Pain Management:    Induction: Intravenous  PONV Risk Score and Plan: 2 and Treatment may vary due to age or medical condition  Airway Management Planned: Oral ETT  Additional Equipment: Arterial line, CVP, PA Cath, TEE and Ultrasound Guidance Line Placement  Intra-op Plan:   Post-operative Plan: Post-operative intubation/ventilation  Informed Consent: I have reviewed the patients History and Physical, chart, labs and discussed the procedure including the risks, benefits and alternatives for the proposed anesthesia with the patient or authorized representative who has indicated his/her understanding and acceptance.     Dental advisory given  Plan Discussed with: CRNA and Anesthesiologist  Anesthesia Plan Comments:         Anesthesia Quick Evaluation

## 2021-10-26 NOTE — Anesthesia Procedure Notes (Signed)
Arterial Line Insertion Start/End2/05/2022 7:00 AM Performed by: Wilburn Cornelia, CRNA, CRNA  Patient location: Pre-op. Preanesthetic checklist: patient identified, IV checked, site marked, risks and benefits discussed, surgical consent, monitors and equipment checked, pre-op evaluation, timeout performed and anesthesia consent Lidocaine 1% used for infiltration Left, radial was placed Catheter size: 20 G Hand hygiene performed  and maximum sterile barriers used   Attempts: 1 Procedure performed without using ultrasound guided technique. Following insertion, dressing applied and Biopatch. Post procedure assessment: normal and unchanged

## 2021-10-26 NOTE — Progress Notes (Signed)
°  Echocardiogram Echocardiogram Transesophageal has been performed.  Jorge Fisher 10/26/2021, 10:53 AM

## 2021-10-26 NOTE — Progress Notes (Signed)
Pre Procedure note for inpatients:   Jorge Fisher has been scheduled for Procedure(s): CORONARY ARTERY BYPASS GRAFTING (CABG) (N/A) TRANSESOPHAGEAL ECHOCARDIOGRAM (TEE) (N/A) ENDOVEIN HARVEST OF GREATER SAPHENOUS VEIN today. The various methods of treatment have been discussed with the patient. After consideration of the risks, benefits and treatment options the patient has consented to the planned procedure.   The patient has been seen and labs reviewed. There are no changes in the patients condition to prevent proceeding with the planned procedure today.  Recent labs:  Lab Results  Component Value Date   WBC 5.7 10/26/2021   HGB 15.0 10/26/2021   HCT 43.4 10/26/2021   PLT 191 10/26/2021   GLUCOSE 90 10/26/2021   CHOL 168 10/21/2021   TRIG 40 10/21/2021   HDL 49 10/21/2021   LDLCALC 111 (H) 10/21/2021   ALT 48 (H) 10/24/2021   AST 74 (H) 10/24/2021   NA 136 10/26/2021   K 4.0 10/26/2021   CL 107 10/26/2021   CREATININE 1.20 10/26/2021   BUN 10 10/26/2021   CO2 23 10/26/2021   TSH 0.709 10/24/2021   INR 1.1 10/24/2021   HGBA1C 5.6 10/21/2021    Dahlia Byes, MD 10/26/2021 8:55 AM

## 2021-10-26 NOTE — Progress Notes (Addendum)
Pt placed on 40/4 by RRT per RWP at 2055. Able to lift head off pillow well and stick tongue out on command, moves all extremities.

## 2021-10-26 NOTE — Anesthesia Procedure Notes (Signed)
Central Venous Catheter Insertion Performed by: Oleta Mouse, MD, anesthesiologist Start/End2/05/2022 6:57 AM, 10/26/2021 7:07 AM Patient location: Pre-op. Preanesthetic checklist: patient identified, IV checked, site marked, risks and benefits discussed, surgical consent, monitors and equipment checked, pre-op evaluation, timeout performed and anesthesia consent Position: supine Lidocaine 1% used for infiltration and patient sedated Hand hygiene performed  and maximum sterile barriers used  Catheter size: 9 Fr MAC introducer Procedure performed using ultrasound guided technique. Ultrasound Notes:anatomy identified, needle tip was noted to be adjacent to the nerve/plexus identified, no ultrasound evidence of intravascular and/or intraneural injection and image(s) printed for medical record Attempts: 1 Following insertion, line sutured, dressing applied and Biopatch. Post procedure assessment: blood return through all ports, free fluid flow and no air  Patient tolerated the procedure well with no immediate complications.

## 2021-10-26 NOTE — Transfer of Care (Signed)
Immediate Anesthesia Transfer of Care Note  Patient: Jorge Fisher  Procedure(s) Performed: CORONARY ARTERY BYPASS GRAFTING (CABG) X FOUR , USING LEFT INTERNAL MAMMARY ARTERY AND RIGHT LEG GREATER SAPHENOUS VEIN HARVESTED ENDOSCOPICALLY (Chest) TRANSESOPHAGEAL ECHOCARDIOGRAM (TEE) (Esophagus) ENDOVEIN HARVEST OF GREATER SAPHENOUS VEIN (Right: Leg Upper) APPLICATION OF CELL SAVER  Patient Location: SICU  Anesthesia Type:General  Level of Consciousness: Patient remains intubated per anesthesia plan  Airway & Oxygen Therapy: Patient remains intubated per anesthesia plan and Patient placed on Ventilator (see vital sign flow sheet for setting)  Post-op Assessment: Report given to RN and Post -op Vital signs reviewed and stable  Post vital signs: Reviewed and stable  Last Vitals:  Vitals Value Taken Time  BP 113/85 10/26/21 1607  Temp    Pulse 88 10/26/21 1612  Resp 12 10/26/21 1612  SpO2 100 % 10/26/21 1612  Vitals shown include unvalidated device data.  Last Pain:  Vitals:   10/26/21 0419  TempSrc: Oral  PainSc:       Patients Stated Pain Goal: 0 (03/75/43 6067)  Complications: No notable events documented.

## 2021-10-26 NOTE — Anesthesia Procedure Notes (Signed)
Central Venous Catheter Insertion Performed by: Oleta Mouse, MD, anesthesiologist Start/End2/05/2022 6:57 AM, 10/26/2021 7:07 AM Patient location: Pre-op. Preanesthetic checklist: patient identified, IV checked, site marked, risks and benefits discussed, surgical consent, monitors and equipment checked, pre-op evaluation, timeout performed and anesthesia consent Hand hygiene performed  and maximum sterile barriers used  PA cath was placed.Swan type:thermodilution Procedure performed without using ultrasound guided technique. Attempts: 1 Patient tolerated the procedure well with no immediate complications.

## 2021-10-26 NOTE — Procedures (Signed)
Extubation Procedure Note  Patient Details:   Name: Jorge Fisher DOB: 03-Mar-1956 MRN: [id number]   Airway Documentation:    Vent end date: 10/26/21 Vent end time: 2150   Evaluation  O2 sats: stable throughout Complications: No apparent complications Patient did tolerate procedure well. Bilateral Breath Sounds: Clear, Diminished   Yes  Patient had VC 1.4 L , NIF -28, and positive cuff leak. Patient extubated to 4 L Wheeler .   Carrington Clamp A 10/26/2021, 9:55 PM

## 2021-10-26 NOTE — Hospital Course (Addendum)
Primary Care: None on file Primary [place], MD  History of Present Illness:     The patient is a 66 year old male with a cardiac history that dates back to May of 2013.  He was diagnosed at that time with an acute STEMI and underwent stenting of both the LAD and circumflex coronary arteries.  Additional cardiac risk factors include hypertension.  He presented to the emergency department on 10/20/2021 with complaints of midsternal chest pressure that started earlier in the day.  Initially the pain was intermittent but became more constant over time.  He described the pain at times had severe up to 10/10.  There were no notable aggravating or alleviating symptoms.  Pain was not worse with exertion.  It was associated with some dyspnea.  He initially felt the symptoms may be indigestion.  In the emergency department it was noted that CK, MB as well as high-sensitivity troponins were significantly elevated.  EKG showed a new right bundle branch block with nonspecific ST changes.  As he ruled in for non-STEMI with a TIMI score of 6, he received full dose aspirin as well as nitroglycerin x1 and was started on heparin drip in the ER.  Cardiology consultation was obtained.  Echocardiogram was obtained showing ejection fraction by estimation of 40 to 45%.  Left ventricular diastolic parameters were indeterminate.  There were no significant valvular disease findings.  Please see the full report below.  Cardiac catheterization was also performed and this showed severe left main and three-vessel coronary artery disease.  We are asked to see the patient in cardiothoracic surgical consultation for consideration of CABG.   Course in Hospital: Mr. Bannister remained stable following left heart catheterization.  He elected to proceed with surgery.  He was taken to the operating room on 10/26/2021 where four-vessel coronary bypass grafting was accomplished.  The left internal mammary artery was grafted to the  left anterior descending coronary artery.  Separate saphenous vein grafts were placed to the first diagonal, obtuse marginal, and posterior descending coronary arteries.  Following the procedure, he separated from cardiopulmonary bypass without difficulty.  He was transferred to the surgical ICU in stable condition.  His respiratory status and hemodynamics remained stable.  He was weaned from the ventilator and extubated routinely by 10 PM on the day of surgery.  On the first postoperative day, he was noted to have significant gastric dilatation on the chest x-ray.  For this reason, he was kept n.p.o.  The gastric dilation improved.  Diet was begun and slowly advance.  He had return of bowel function.  Diuresis was begun on postop day 2.  The milrinone was weaned and discontinued.  Follow-up Co. ox on the following morning was 71.  On postop day 3, the chest tubes were removed.  Activity was advanced.

## 2021-10-26 NOTE — Progress Notes (Signed)
EVENING ROUNDS NOTE :     [address]       [place] [address]             [phone]                 Day of Surgery Procedure(s) (LRB): CORONARY ARTERY BYPASS GRAFTING (CABG) X FOUR , USING LEFT INTERNAL MAMMARY ARTERY AND RIGHT LEG GREATER SAPHENOUS VEIN HARVESTED ENDOSCOPICALLY (N/A) TRANSESOPHAGEAL ECHOCARDIOGRAM (TEE) (N/A) ENDOVEIN HARVEST OF GREATER SAPHENOUS VEIN (Right) APPLICATION OF CELL SAVER (N/A)   Total Length of Stay:  LOS: 5 days  Events:   No events Minimal CT output Will get flow trac from OR Checking CO-Ox    BP 113/85 (BP Location: Left Arm)    Pulse 88    Temp (!) 96.6 F (35.9 C)    Resp 12    Ht 5\' 10"  (1.778 m)    Wt 78.1 kg    SpO2 100%    BMI 24.69 kg/m   PAP: (17-20)/(11-15) 20/15  Vent Mode: SIMV;PSV;PRVC FiO2 (%):  [50 %] 50 % Set Rate:  [12 bmp] 12 bmp Vt Set:  [580 mL] 580 mL PEEP:  [5 cmH20] 5 cmH20 Pressure Support:  [10 cmH20] 10 cmH20   sodium chloride 20 mL/hr at 10/26/21 1605   [START ON 10/27/2021] sodium chloride     sodium chloride     albumin human      ceFAZolin (ANCEF) IV     dexmedetomidine (PRECEDEX) IV infusion 0.7 mcg/kg/hr (10/26/21 1700)   famotidine (PEPCID) IV     insulin 0.8 Units/hr (10/26/21 1605)   lactated ringers     lactated ringers     lactated ringers 100 mL/hr at 10/26/21 1605   magnesium sulfate 4 g (10/26/21 1641)   milrinone 0.25 mcg/kg/min (10/26/21 1605)   nitroGLYCERIN     phenylephrine (NEO-SYNEPHRINE) Adult infusion 30 mcg/min (10/26/21 1605)   potassium chloride 10 mEq (10/26/21 1639)   vancomycin      I/O last 3 completed shifts: In: 294.7 [P.O.:120; I.V.:174.7] Out: 600 [Urine:600]   CBC Latest Ref Rng & Units 10/26/2021 10/26/2021 10/26/2021  WBC 4.0 - 10.5 K/uL - - -  Hemoglobin 13.0 - 17.0 g/dL 9.9(L) 8.8(L) 9.2(L)  Hematocrit 39.0 - 52.0 % 29.0(L) 26.0(L) 27.0(L)  Platelets 150 - 400 K/uL - - -    BMP Latest Ref Rng & Units 10/26/2021 10/26/2021 10/26/2021  Glucose  70 - 99 mg/dL 123(H) - -  BUN 8 - 23 mg/dL 7(L) - -  Creatinine 0.61 - 1.24 mg/dL 0.90 - -  Sodium 135 - 145 mmol/L 139 140 138  Potassium 3.5 - 5.1 mmol/L 3.6 3.6 3.8  Chloride 98 - 111 mmol/L 102 - -  CO2 22 - 32 mmol/L - - -  Calcium 8.9 - 10.3 mg/dL - - -    ABG    Component Value Date/Time   PHART 7.398 10/26/2021 1436   PCO2ART 37.6 10/26/2021 1436   PO2ART 114 (H) 10/26/2021 1436   HCO3 23.2 10/26/2021 1436   TCO2 23 10/26/2021 1441   ACIDBASEDEF 1.0 10/26/2021 1436   O2SAT 98.0 10/26/2021 [place], MD 10/26/2021 5:08 PM

## 2021-10-26 NOTE — Brief Op Note (Signed)
10/20/2021 - 10/26/2021  1:42 PM  PATIENT:  Jorge Fisher  66 y.o. male  PRE-OPERATIVE DIAGNOSIS:  coronary artery disease  POST-OPERATIVE DIAGNOSIS:  coronary artery disease  PROCEDURE:  CORONARY ARTERY BYPASS GRAFTING (CABG) X FOUR , USING LEFT INTERNAL MAMMARY ARTERY AND RIGHT LEG GREATER SAPHENOUS VEIN HARVESTED ENDOSCOPICALLY   LIMA-LAD SVG-D1 SVG-OM SVG-PDA  Vein harvest time: 30min            Vein prep time: 52min  TRANSESOPHAGEAL ECHOCARDIOGRAM (TEE) (N/A) ENDOVEIN HARVEST OF GREATER SAPHENOUS VEIN (Right) APPLICATION OF CELL SAVER (N/A)  SURGEON:  Dahlia Byes, MD -   PHYSICIAN ASSISTANT: Ihan Pat  ASSISTANTS: Dineen Kid, RN, RN First Assistant   ANESTHESIA:   general  EBL:  677ml   BLOOD ADMINISTERED: 321ml CellSaver blood  DRAINS:  Mediastinal and left pleural tubes    LOCAL MEDICATIONS USED:  NONE  SPECIMEN:  No Specimen  DISPOSITION OF SPECIMEN:  N/A  COUNTS:  YES  DICTATION: .Dragon Dictation  PLAN OF CARE: Admit to inpatient   PATIENT DISPOSITION:  ICU - intubated and hemodynamically stable.   Delay start of Pharmacological VTE agent (>24hrs) due to surgical blood loss or risk of bleeding: yes

## 2021-10-27 ENCOUNTER — Inpatient Hospital Stay (HOSPITAL_COMMUNITY): Payer: 59

## 2021-10-27 ENCOUNTER — Encounter (HOSPITAL_COMMUNITY): Payer: Self-pay | Admitting: Cardiothoracic Surgery

## 2021-10-27 LAB — POCT I-STAT 7, (LYTES, BLD GAS, ICA,H+H)
Acid-base deficit: 6 mmol/L — ABNORMAL HIGH (ref 0.0–2.0)
Bicarbonate: 20.1 mmol/L (ref 20.0–28.0)
Calcium, Ion: 1.15 mmol/L (ref 1.15–1.40)
HCT: 27 % — ABNORMAL LOW (ref 39.0–52.0)
Hemoglobin: 9.2 g/dL — ABNORMAL LOW (ref 13.0–17.0)
O2 Saturation: 97 %
Patient temperature: 36.9
Potassium: 4 mmol/L (ref 3.5–5.1)
Sodium: 140 mmol/L (ref 135–145)
TCO2: 21 mmol/L — ABNORMAL LOW (ref 22–32)
pCO2 arterial: 39.2 mmHg (ref 32.0–48.0)
pH, Arterial: 7.318 — ABNORMAL LOW (ref 7.350–7.450)
pO2, Arterial: 95 mmHg (ref 83.0–108.0)

## 2021-10-27 LAB — CBC
HCT: 26.4 % — ABNORMAL LOW (ref 39.0–52.0)
HCT: 26.8 % — ABNORMAL LOW (ref 39.0–52.0)
Hemoglobin: 9.2 g/dL — ABNORMAL LOW (ref 13.0–17.0)
Hemoglobin: 9.4 g/dL — ABNORMAL LOW (ref 13.0–17.0)
MCH: 33 pg (ref 26.0–34.0)
MCH: 33.2 pg (ref 26.0–34.0)
MCHC: 34.8 g/dL (ref 30.0–36.0)
MCHC: 35.1 g/dL (ref 30.0–36.0)
MCV: 94.6 fL (ref 80.0–100.0)
MCV: 94.7 fL (ref 80.0–100.0)
Platelets: 123 10*3/uL — ABNORMAL LOW (ref 150–400)
Platelets: 120 10*3/uL — ABNORMAL LOW (ref 150–400)
RBC: 2.79 MIL/uL — ABNORMAL LOW (ref 4.22–5.81)
RBC: 2.83 MIL/uL — ABNORMAL LOW (ref 4.22–5.81)
RDW: 12.6 % (ref 11.5–15.5)
RDW: 13.1 % (ref 11.5–15.5)
WBC: 10.5 10*3/uL (ref 4.0–10.5)
WBC: 11.8 10*3/uL — ABNORMAL HIGH (ref 4.0–10.5)
nRBC: 0 % (ref 0.0–0.2)
nRBC: 0 % (ref 0.0–0.2)

## 2021-10-27 LAB — BASIC METABOLIC PANEL
Anion gap: 7 (ref 5–15)
Anion gap: 7 (ref 5–15)
Anion gap: 8 (ref 5–15)
BUN: 9 mg/dL (ref 8–23)
BUN: 10 mg/dL (ref 8–23)
BUN: 10 mg/dL (ref 8–23)
CO2: 20 mmol/L — ABNORMAL LOW (ref 22–32)
CO2: 20 mmol/L — ABNORMAL LOW (ref 22–32)
CO2: 20 mmol/L — ABNORMAL LOW (ref 22–32)
Calcium: 7.3 mg/dL — ABNORMAL LOW (ref 8.9–10.3)
Calcium: 7.4 mg/dL — ABNORMAL LOW (ref 8.9–10.3)
Calcium: 7.9 mg/dL — ABNORMAL LOW (ref 8.9–10.3)
Chloride: 108 mmol/L (ref 98–111)
Chloride: 108 mmol/L (ref 98–111)
Chloride: 106 mmol/L (ref 98–111)
Creatinine, Ser: 1.06 mg/dL (ref 0.61–1.24)
Creatinine, Ser: 1.08 mg/dL (ref 0.61–1.24)
Creatinine, Ser: 0.92 mg/dL (ref 0.61–1.24)
GFR, Estimated: >60 mL/min (ref >60)
GFR, Estimated: >60 mL/min (ref >60)
GFR, Estimated: >60 mL/min (ref >60)
Glucose, Bld: 148 mg/dL — ABNORMAL HIGH (ref 70–99)
Glucose, Bld: 127 mg/dL — ABNORMAL HIGH (ref 70–99)
Glucose, Bld: 140 mg/dL — ABNORMAL HIGH (ref 70–99)
Potassium: 4 mmol/L (ref 3.5–5.1)
Potassium: 4 mmol/L (ref 3.5–5.1)
Potassium: 3.9 mmol/L (ref 3.5–5.1)
Sodium: 135 mmol/L (ref 135–145)
Sodium: 135 mmol/L (ref 135–145)
Sodium: 134 mmol/L — ABNORMAL LOW (ref 135–145)

## 2021-10-27 LAB — GLUCOSE, CAPILLARY
Glucose-Capillary: 123 mg/dL — ABNORMAL HIGH (ref 70–99)
Glucose-Capillary: 126 mg/dL — ABNORMAL HIGH (ref 70–99)
Glucose-Capillary: 129 mg/dL — ABNORMAL HIGH (ref 70–99)
Glucose-Capillary: 130 mg/dL — ABNORMAL HIGH (ref 70–99)
Glucose-Capillary: 130 mg/dL — ABNORMAL HIGH (ref 70–99)
Glucose-Capillary: 134 mg/dL — ABNORMAL HIGH (ref 70–99)
Glucose-Capillary: 134 mg/dL — ABNORMAL HIGH (ref 70–99)
Glucose-Capillary: 136 mg/dL — ABNORMAL HIGH (ref 70–99)
Glucose-Capillary: 136 mg/dL — ABNORMAL HIGH (ref 70–99)
Glucose-Capillary: 141 mg/dL — ABNORMAL HIGH (ref 70–99)
Glucose-Capillary: 148 mg/dL — ABNORMAL HIGH (ref 70–99)
Glucose-Capillary: 154 mg/dL — ABNORMAL HIGH (ref 70–99)

## 2021-10-27 LAB — COOXEMETRY PANEL
Carboxyhemoglobin: 0.9 % (ref 0.5–1.5)
Methemoglobin: 0.9 % (ref 0.0–1.5)
O2 Saturation: 52.4 %
Total hemoglobin: 8.9 g/dL — ABNORMAL LOW (ref 12.0–16.0)

## 2021-10-27 LAB — MAGNESIUM
Magnesium: 2.4 mg/dL (ref 1.7–2.4)
Magnesium: 2.5 mg/dL — ABNORMAL HIGH (ref 1.7–2.4)

## 2021-10-27 MED ORDER — ALBUMIN HUMAN 5 % IV SOLN
12.5000 g | Freq: Once | INTRAVENOUS | Status: AC
  Administered 2021-10-27: 12.5 g via INTRAVENOUS
  Filled 2021-10-27: qty 250

## 2021-10-27 MED ORDER — INSULIN ASPART 100 UNIT/ML IJ SOLN
0.0000 [IU] | INTRAMUSCULAR | Status: DC
  Administered 2021-10-27 – 2021-10-28 (×7): 2 [IU] via SUBCUTANEOUS

## 2021-10-27 MED ORDER — NOREPINEPHRINE 4 MG/250ML-% IV SOLN
0.0000 ug/min | INTRAVENOUS | Status: DC
  Administered 2021-10-27: 5 ug/min via INTRAVENOUS
  Administered 2021-10-27: 4 ug/min via INTRAVENOUS
  Filled 2021-10-27 (×2): qty 250

## 2021-10-27 MED ORDER — MORPHINE SULFATE (PF) 2 MG/ML IV SOLN: 1.0000 mg | INTRAVENOUS | Status: DC | PRN

## 2021-10-27 MED ORDER — METOCLOPRAMIDE HCL 5 MG/ML IJ SOLN
10.0000 mg | Freq: Four times a day (QID) | INTRAMUSCULAR | Status: DC
  Administered 2021-10-27 – 2021-10-30 (×12): 10 mg via INTRAVENOUS
  Filled 2021-10-27 (×12): qty 2

## 2021-10-27 MED ORDER — FUROSEMIDE 10 MG/ML IJ SOLN
20.0000 mg | Freq: Two times a day (BID) | INTRAMUSCULAR | Status: DC
  Administered 2021-10-27 – 2021-10-30 (×6): 20 mg via INTRAVENOUS
  Filled 2021-10-27 (×6): qty 2

## 2021-10-27 MED ORDER — AMIODARONE HCL 200 MG PO TABS
400.0000 mg | ORAL_TABLET | Freq: Two times a day (BID) | ORAL | Status: DC
  Administered 2021-10-28 (×2): 400 mg via ORAL
  Filled 2021-10-27 (×2): qty 2

## 2021-10-27 NOTE — Op Note (Signed)
Jorge Fisher, Jorge Fisher MEDICAL RECORD NO: [id number] ACCOUNT NO: [id number] DATE OF BIRTH: Dec 26, 1955 FACILITY: [facility] LOCATION: [facility] PHYSICIAN: Len Childs, MD  Operative Report   DATE OF PROCEDURE: 10/26/2021  PROCEDURES PERFORMED:    1.  Coronary artery bypass grafting x 4 (left internal mammary artery to LAD, saphenous vein graft to diagonal, saphenous vein graft to the obtuse marginal, saphenous vein graft to posterior descending). 2.  Endoscopic harvest of right leg greater saphenous vein.  SURGEON:  Len Childs, MD  ASSISTANT:  Enid Cutter, PA-C.  A surgical first assistant was needed for this surgery to provide surgical standard of care as well as to assist with the endoscopic harvesting of the saphenous vein conduit and closure of the leg incisions.  The  first assistant was also needed for completion of the distal anastomoses, dissection, suctioning, and exposure.  PREOPERATIVE DIAGNOSES:  Severe 3-vessel coronary artery disease, non-ST elevation MI, mild LV dysfunction, history of previous PCI of the LAD and circumflex with chronic occlusion of the RCA.  POSTOPERATIVE DIAGNOSES:  Severe 3-vessel coronary artery disease, non-ST elevation MI, mild LV dysfunction, history of previous PCI of the LAD and circumflex with chronic occlusion of the RCA.  DESCRIPTION OF PROCEDURE:  The patient was brought from preoperative holding where informed consent was documented and the final issues were addressed with the patient in a face-to-face meeting.  The patient was then brought to the operating room and  placed supine on the operating room table.  General anesthesia was induced under invasive hemodynamic monitoring.  A transesophageal echo probe was placed by the anesthesia team.  The patient was then prepped and draped as a sterile field.  A proper  timeout was performed.  A sternal incision was made as the saphenous vein was harvested endoscopically from the right leg.   The left internal mammary artery was harvested as a pedicle graft from its origin at the subclavian vessels.  It was small with a  diameter of 1.2 mm, but with adequate flow.  The sternal retractor was placed and the pericardium was opened and suspended.  The aorta was inspected and palpated and was without significant plaque.  Pursestrings were placed in the ascending aorta and  right atrium and after heparin was administered and therapeutic levels were documented, the patient was cannulated and placed on bypass.  The coronaries were identified for grafting.  The mammary artery and vein grafts were prepared for the distal  anastomoses and cardioplegia cannulas were placed both antegrade and retrograde cold blood cardioplegia.  The patient was cooled to 32 degrees and the aortic crossclamp was applied.  One liter of cold blood cardioplegia was delivered in split doses  between the antegrade aortic and retrograde coronary sinus catheters.  There was good cardioplegic arrest and septal temperature dropped less than 14 degrees.  Cardioplegia was delivered every 20 minutes while the crossclamp was in place.  The distal coronary anastomoses were performed.  The first distal anastomosis was to the posterior descending.  This was chronically occluded proximally.  It was 1.4 mm vessel.  A reverse saphenous vein was sewn end-to-side with running 7-0 Prolene with  good flow through the graft.  Cardioplegia was redosed.  The second distal anastomosis was to the OM branch of the left coronary.  There was a proximal 90% stenosis. The reverse saphenous vein was sewn end-to-side with running 7-0 Prolene.  There was good flow through the graft.  Cardioplegia was redosed.  The third  distal anastomosis was to the diagonal branch of the LAD.  There was a proximal 80% stenosis at its origin.  A reverse saphenous vein was sewn end-to-side with running 7-0 Prolene.  There was good flow through the graft.  Cardioplegia was   redosed.  The fourth distal anastomosis was the LAD.  It was intramyocardial, but it was exposed at the distal third.  It was 1.5 mm vessel with proximal 90% stenosis.  The left IMA pedicle was brought through an opening in the left lateral pericardium and it was  brought down onto the LAD and sewn end-to-side with running 8-0 Prolene.  The bulldog was released and there was flow noted through the graft with elevation of the septal temperature probe.  The bulldog was reapplied and the pedicle was secured to the  epicardium with 6-0 Prolenes.  Cardioplegia was redosed.  While the cross-clamp was still in place, 3 proximal vein anastomoses were performed using a running 6-0 Prolene and a 4.5 mm punch.  Prior to releasing the crossclamp, air was vented from the coronaries with a dose of retrograde warm blood cardioplegia  and the usual de-airing maneuvers on bypass.  The cross-clamp was removed.  The heart resumed a spontaneous rhythm.  The vein grafts were deaired.  There was good hemostasis at the proximal and distal sites.  The patient was rewarmed and reperfused. Temporary pacing wires were applied.  The lungs were expanded and the ventilator  was resumed.  The patient was then weaned from cardiopulmonary bypass on low dose milrinone.  Echo showed good LV function.  Cardiac output was 4.2 liters per minute.  Protamine was administered without adverse reaction.  The cannulas were removed.  The superior pericardial fat was closed over the aorta.  Anterior mediastinal and bilateral pleural tubes were placed and brought out through separate incisions.  The sternum was closed with interrupted steel wire.  The pectoralis fascia was closed with  a running #1 Vicryl.  Subcutaneous and skin layers were closed with a running Vicryl.  The patient then returned to the ICU in stable condition.  Total cardiopulmonary bypass time was 157 minutes.   MUK D: 10/26/2021 7:24:45 pm T: 10/27/2021 1:32:00 am  JOB:  [id number]/ [id number]

## 2021-10-27 NOTE — Progress Notes (Signed)
1 Day Post-Op Procedure(s) (LRB): CORONARY ARTERY BYPASS GRAFTING (CABG) X FOUR , USING LEFT INTERNAL MAMMARY ARTERY AND RIGHT LEG GREATER SAPHENOUS VEIN HARVESTED ENDOSCOPICALLY (N/A) TRANSESOPHAGEAL ECHOCARDIOGRAM (TEE) (N/A) ENDOVEIN HARVEST OF GREATER SAPHENOUS VEIN (Right) APPLICATION OF CELL SAVER (N/A) Subjective: Extubated in nsr Gastric dilatation on CXR Objective: Vital signs in last 24 hours: Temp:  [96.3 F (35.7 C)-98.6 F (37 C)] 98.6 F (37 C) (02/10 0600) Pulse Rate:  [51-89] 84 (02/10 0700) Cardiac Rhythm: Atrial paced;Heart block (02/10 0400) Resp:  [0-28] 23 (02/10 0700) BP: (75-129)/(56-99) 88/65 (02/10 0700) SpO2:  [87 %-100 %] 97 % (02/10 0700) Arterial Line BP: (88-136)/(55-83) 109/69 (02/09 1815) FiO2 (%):  [40 %-50 %] 40 % (02/09 2055) Weight:  [86.3 kg] 86.3 kg (02/10 0500)  Hemodynamic parameters for last 24 hours: PAP: (9-35)/(-1-18) 16/9 CVP:  [5 mmHg-17 mmHg] 9 mmHg  Intake/Output from previous day: 02/09 0701 - 02/10 0700 In: 6506.2 [P.O.:400; I.V.:4102.7; Blood:300; IV Piggyback:1700.5] Out: 3305 [Urine:2265; Blood:600; Chest Tube:440] Intake/Output this shift: No intake/output data recorded.  Neuro intact No air leak from chest tubes  Lab Results: Recent Labs    10/26/21 2303 10/26/21 2304 10/27/21 0043 10/27/21 0408  WBC 12.9*  --   --  10.5  HGB 9.5*   < > 9.2* 9.2*  HCT 27.9*   < > 27.0* 26.4*  PLT 131*  --   --  123*   < > = values in this interval not displayed.   BMET:  Recent Labs    10/26/21 2303 10/26/21 2304 10/27/21 0043 10/27/21 0408  NA 135   < > 140 135  K 4.0   < > 4.0 4.0  CL 108  --   --  108  CO2 20*  --   --  20*  GLUCOSE 148*  --   --  127*  BUN 9  --   --  10  CREATININE 1.06  --   --  1.08  CALCIUM 7.3*  --   --  7.4*   < > = values in this interval not displayed.    PT/INR:  Recent Labs    10/26/21 1627  LABPROT 18.2*  INR 1.5*   ABG    Component Value Date/Time   PHART 7.318 (L)  10/27/2021 0043   HCO3 20.1 10/27/2021 0043   TCO2 21 (L) 10/27/2021 0043   ACIDBASEDEF 6.0 (H) 10/27/2021 0043   O2SAT 52.4 10/27/2021 0412   CBG (last 3)  Recent Labs    10/27/21 0302 10/27/21 0407 10/27/21 0603  GLUCAP 130* 136* 129*    Assessment/Plan: S/P Procedure(s) (LRB): CORONARY ARTERY BYPASS GRAFTING (CABG) X FOUR , USING LEFT INTERNAL MAMMARY ARTERY AND RIGHT LEG GREATER SAPHENOUS VEIN HARVESTED ENDOSCOPICALLY (N/A) TRANSESOPHAGEAL ECHOCARDIOGRAM (TEE) (N/A) ENDOVEIN HARVEST OF GREATER SAPHENOUS VEIN (Right) APPLICATION OF CELL SAVER (N/A) Mobilize Diabetes control See progression orders   LOS: 6 days    Dahlia Byes 10/27/2021

## 2021-10-27 NOTE — Plan of Care (Signed)

## 2021-10-27 NOTE — Progress Notes (Signed)
Patient ID: Jorge Fisher, male   DOB: Jan 28, 1956, 66 y.o.   MRN: [id number] T[facility] Evening Rounds:  Hemodynamically stable in sinus rhythm on milrinone 0.125 and NE 2. Amio 30.  UO ok BMET    Component Value Date/Time   NA 134 (L) 10/27/2021 1556   K 3.9 10/27/2021 1556   CL 106 10/27/2021 1556   CO2 20 (L) 10/27/2021 1556   GLUCOSE 140 (H) 10/27/2021 1556   BUN 10 10/27/2021 1556   CREATININE 0.92 10/27/2021 1556   CALCIUM 7.9 (L) 10/27/2021 1556   GFRNONAA >60 10/27/2021 1556   CBC    Component Value Date/Time   WBC 11.8 (H) 10/27/2021 1556   RBC 2.83 (L) 10/27/2021 1556   HGB 9.4 (L) 10/27/2021 1556   HCT 26.8 (L) 10/27/2021 1556   PLT 120 (L) 10/27/2021 1556   MCV 94.7 10/27/2021 1556   MCH 33.2 10/27/2021 1556   MCHC 35.1 10/27/2021 1556   RDW 13.1 10/27/2021 1556   LYMPHSABS 1.7 02/08/2012 0002   MONOABS 0.3 02/08/2012 0002   EOSABS 0.0 02/08/2012 0002   BASOSABS 0.0 02/08/2012 0002

## 2021-10-28 ENCOUNTER — Inpatient Hospital Stay (HOSPITAL_COMMUNITY): Payer: 59

## 2021-10-28 LAB — GLUCOSE, CAPILLARY
Glucose-Capillary: 110 mg/dL — ABNORMAL HIGH (ref 70–99)
Glucose-Capillary: 123 mg/dL — ABNORMAL HIGH (ref 70–99)
Glucose-Capillary: 133 mg/dL — ABNORMAL HIGH (ref 70–99)
Glucose-Capillary: 133 mg/dL — ABNORMAL HIGH (ref 70–99)
Glucose-Capillary: 138 mg/dL — ABNORMAL HIGH (ref 70–99)
Glucose-Capillary: 90 mg/dL (ref 70–99)

## 2021-10-28 LAB — BASIC METABOLIC PANEL
Anion gap: 8 (ref 5–15)
BUN: 14 mg/dL (ref 8–23)
CO2: 21 mmol/L — ABNORMAL LOW (ref 22–32)
Calcium: 7.8 mg/dL — ABNORMAL LOW (ref 8.9–10.3)
Chloride: 105 mmol/L (ref 98–111)
Creatinine, Ser: 1.05 mg/dL (ref 0.61–1.24)
GFR, Estimated: >60 mL/min (ref >60)
Glucose, Bld: 142 mg/dL — ABNORMAL HIGH (ref 70–99)
Potassium: 4 mmol/L (ref 3.5–5.1)
Sodium: 134 mmol/L — ABNORMAL LOW (ref 135–145)

## 2021-10-28 LAB — CBC
HCT: 25.7 % — ABNORMAL LOW (ref 39.0–52.0)
Hemoglobin: 8.3 g/dL — ABNORMAL LOW (ref 13.0–17.0)
MCH: 31.7 pg (ref 26.0–34.0)
MCHC: 32.3 g/dL (ref 30.0–36.0)
MCV: 98.1 fL (ref 80.0–100.0)
Platelets: 95 10*3/uL — ABNORMAL LOW (ref 150–400)
RBC: 2.62 MIL/uL — ABNORMAL LOW (ref 4.22–5.81)
RDW: 13.4 % (ref 11.5–15.5)
WBC: 11.1 10*3/uL — ABNORMAL HIGH (ref 4.0–10.5)
nRBC: 0 % (ref 0.0–0.2)

## 2021-10-28 LAB — COOXEMETRY PANEL
Carboxyhemoglobin: 0.9 % (ref 0.5–1.5)
Methemoglobin: 1 % (ref 0.0–1.5)
O2 Saturation: 68.1 %
Total hemoglobin: 8.7 g/dL — ABNORMAL LOW (ref 12.0–16.0)

## 2021-10-28 NOTE — Progress Notes (Signed)
2 Days Post-Op Procedure(s) (LRB): CORONARY ARTERY BYPASS GRAFTING (CABG) X FOUR , USING LEFT INTERNAL MAMMARY ARTERY AND RIGHT LEG GREATER SAPHENOUS VEIN HARVESTED ENDOSCOPICALLY (N/A) TRANSESOPHAGEAL ECHOCARDIOGRAM (TEE) (N/A) ENDOVEIN HARVEST OF GREATER SAPHENOUS VEIN (Right) APPLICATION OF CELL SAVER (N/A) Subjective: Some nausea today. Passing flatus, no BM yet. Feels like he needs something on his stomach.  Objective: Vital signs in last 24 hours: Temp:  [98 F (36.7 C)-98.9 F (37.2 C)] 98 F (36.7 C) (02/11 1918) Pulse Rate:  [78-126] 106 (02/11 2155) Cardiac Rhythm: Normal sinus rhythm;Sinus tachycardia (02/11 2000) Resp:  [12-25] 22 (02/11 2145) BP: (106-124)/(64-86) 112/68 (02/11 2155) SpO2:  [82 %-100 %] 99 % (02/11 2145) Weight:  [73.1 kg] 73.1 kg (02/11 0500)  Hemodynamic parameters for last 24 hours:    Intake/Output from previous day: 02/10 0701 - 02/11 0700 In: 1769.8 [P.O.:30; I.V.:1439.2; IV Piggyback:300.5] Out: 1415 [Urine:955; Chest Tube:460] Intake/Output this shift: Total I/O In: 86.3 [I.V.:86.3] Out: 575 [Urine:575]  General appearance: alert and cooperative Neurologic: intact Heart: regular rate and rhythm Lungs: clear to auscultation bilaterally Abdomen: soft, non-tender; bowel sounds normal; Extremities: no edema Wound: Aquacel in place  Lab Results: Recent Labs    10/27/21 1556 10/28/21 0218  WBC 11.8* 11.1*  HGB 9.4* 8.3*  HCT 26.8* 25.7*  PLT 120* 95*   BMET:  Recent Labs    10/27/21 1556 10/28/21 0218  NA 134* 134*  K 3.9 4.0  CL 106 105  CO2 20* 21*  GLUCOSE 140* 142*  BUN 10 14  CREATININE 0.92 1.05  CALCIUM 7.9* 7.8*    PT/INR:  Recent Labs    10/26/21 1627  LABPROT 18.2*  INR 1.5*   ABG    Component Value Date/Time   PHART 7.318 (L) 10/27/2021 0043   HCO3 20.1 10/27/2021 0043   TCO2 21 (L) 10/27/2021 0043   ACIDBASEDEF 6.0 (H) 10/27/2021 0043   O2SAT 68.1 10/28/2021 0218   CBG (last 3)  Recent Labs     10/28/21 1129 10/28/21 1610 10/28/21 1916  GLUCAP 133* 90 110*   CXR: small right apical ptx.  Assessment/Plan: S/P Procedure(s) (LRB): CORONARY ARTERY BYPASS GRAFTING (CABG) X FOUR , USING LEFT INTERNAL MAMMARY ARTERY AND RIGHT LEG GREATER SAPHENOUS VEIN HARVESTED ENDOSCOPICALLY (N/A) TRANSESOPHAGEAL ECHOCARDIOGRAM (TEE) (N/A) ENDOVEIN HARVEST OF GREATER SAPHENOUS VEIN (Right) APPLICATION OF CELL SAVER (N/A)  Hemodynamically stable in sinus rhythm. Co-ox 68 this am on milrinone 0.125. will stop it.  On PO amio for atrial fib.  Glucose under good control and normal Hgb A1c preop on no meds. Will stop CBG's and SSI.   Volume excess: diuresing well on lasix. Wts are completely inaccurate and all over the place.  DC arterial line.  DC chest tubes in am.   LOS: 7 days    Gaye Pollack 10/28/2021

## 2021-10-29 ENCOUNTER — Inpatient Hospital Stay (HOSPITAL_COMMUNITY): Payer: 59

## 2021-10-29 LAB — CBC
HCT: 23.7 % — ABNORMAL LOW (ref 39.0–52.0)
Hemoglobin: 8 g/dL — ABNORMAL LOW (ref 13.0–17.0)
MCH: 32.5 pg (ref 26.0–34.0)
MCHC: 33.8 g/dL (ref 30.0–36.0)
MCV: 96.3 fL (ref 80.0–100.0)
Platelets: 103 10*3/uL — ABNORMAL LOW (ref 150–400)
RBC: 2.46 MIL/uL — ABNORMAL LOW (ref 4.22–5.81)
RDW: 13.6 % (ref 11.5–15.5)
WBC: 11.6 10*3/uL — ABNORMAL HIGH (ref 4.0–10.5)
nRBC: 0 % (ref 0.0–0.2)

## 2021-10-29 LAB — BPAM RBC
Blood Product Expiration Date: 202303032359
Blood Product Expiration Date: 202303032359
Unit Type and Rh: 6200
Unit Type and Rh: 6200

## 2021-10-29 LAB — TYPE AND SCREEN
ABO/RH(D): A POS
Antibody Screen: NEGATIVE
Unit division: 0
Unit division: 0

## 2021-10-29 LAB — BASIC METABOLIC PANEL
Anion gap: 9 (ref 5–15)
BUN: 15 mg/dL (ref 8–23)
CO2: 25 mmol/L (ref 22–32)
Calcium: 7.8 mg/dL — ABNORMAL LOW (ref 8.9–10.3)
Chloride: 99 mmol/L (ref 98–111)
Creatinine, Ser: 1.01 mg/dL (ref 0.61–1.24)
GFR, Estimated: >60 mL/min (ref >60)
Glucose, Bld: 105 mg/dL — ABNORMAL HIGH (ref 70–99)
Potassium: 3.6 mmol/L (ref 3.5–5.1)
Sodium: 133 mmol/L — ABNORMAL LOW (ref 135–145)

## 2021-10-29 LAB — COOXEMETRY PANEL
Carboxyhemoglobin: 1 % (ref 0.5–1.5)
Methemoglobin: 1.1 % (ref 0.0–1.5)
O2 Saturation: 71.1 %
Total hemoglobin: 8.2 g/dL — ABNORMAL LOW (ref 12.0–16.0)

## 2021-10-29 MED ORDER — AMIODARONE HCL 200 MG PO TABS
200.0000 mg | ORAL_TABLET | Freq: Two times a day (BID) | ORAL | Status: DC
  Administered 2021-10-29 – 2021-11-01 (×7): 200 mg via ORAL
  Filled 2021-10-29 (×7): qty 1

## 2021-10-29 MED ORDER — POTASSIUM CHLORIDE 10 MEQ/50ML IV SOLN
10.0000 meq | INTRAVENOUS | Status: AC
  Administered 2021-10-29 (×3): 10 meq via INTRAVENOUS
  Filled 2021-10-29 (×3): qty 50

## 2021-10-29 MED ORDER — FE FUMARATE-B12-VIT C-FA-IFC PO CAPS
1.0000 | ORAL_CAPSULE | Freq: Two times a day (BID) | ORAL | Status: DC
  Administered 2021-10-29 – 2021-11-01 (×7): 1 via ORAL
  Filled 2021-10-29 (×7): qty 1

## 2021-10-29 MED ORDER — TRAMADOL HCL 50 MG PO TABS
50.0000 mg | ORAL_TABLET | ORAL | Status: DC | PRN
  Administered 2021-10-29: 50 mg via ORAL
  Filled 2021-10-29: qty 1

## 2021-10-29 NOTE — Plan of Care (Signed)
°  Problem: Cardiovascular: Goal: Ability to achieve and maintain adequate cardiovascular perfusion will improve Outcome: Progressing Goal: Vascular access site(s) Level 0-1 will be maintained Outcome: Progressing   Problem: Education: Goal: Understanding of CV disease, CV risk reduction, and recovery process will improve Outcome: Progressing Goal: Individualized Educational Video(s) Outcome: Progressing   Problem: Clinical Measurements: Goal: Ability to maintain clinical measurements within normal limits will improve Outcome: Progressing Goal: Will remain free from infection Outcome: Progressing Goal: Diagnostic test results will improve Outcome: Progressing Goal: Respiratory complications will improve Outcome: Progressing Goal: Cardiovascular complication will be avoided Outcome: Progressing

## 2021-10-29 NOTE — Progress Notes (Signed)
3 Days Post-Op Procedure(s) (LRB): CORONARY ARTERY BYPASS GRAFTING (CABG) X FOUR , USING LEFT INTERNAL MAMMARY ARTERY AND RIGHT LEG GREATER SAPHENOUS VEIN HARVESTED ENDOSCOPICALLY (N/A) TRANSESOPHAGEAL ECHOCARDIOGRAM (TEE) (N/A) ENDOVEIN HARVEST OF GREATER SAPHENOUS VEIN (Right) APPLICATION OF CELL SAVER (N/A) Subjective:  Feels better today. Nausea better after taking clear liquids. Passing flatus but no BM yet.  ambulated  Objective: Vital signs in last 24 hours: Temp:  [98 F (36.7 C)-99.4 F (37.4 C)] 98.2 F (36.8 C) (02/12 0722) Pulse Rate:  [78-126] 102 (02/12 0800) Cardiac Rhythm: Normal sinus rhythm (02/12 0800) Resp:  [12-24] 17 (02/12 0800) BP: (94-124)/(51-80) 110/64 (02/12 0800) SpO2:  [91 %-100 %] 94 % (02/12 0800) Weight:  [84.1 kg] 84.1 kg (02/12 0500)  Hemodynamic parameters for last 24 hours:    Intake/Output from previous day: 02/11 0701 - 02/12 0700 In: 512.2 [P.O.:320; I.V.:192.2] Out: 2605 [Urine:2170; Chest Tube:435] Intake/Output this shift: Total I/O In: 120 [P.O.:120] Out: -   General appearance: alert and cooperative Neurologic: intact Heart: regular rate and rhythm Lungs: clear to auscultation bilaterally Abdomen: soft, non-tender; bowel sounds normal  Extremities: edema mild Wound: Aquacel dressing intact  Lab Results: Recent Labs    10/28/21 0218 10/29/21 0449  WBC 11.1* 11.6*  HGB 8.3* 8.0*  HCT 25.7* 23.7*  PLT 95* 103*   BMET:  Recent Labs    10/28/21 0218 10/29/21 0449  NA 134* 133*  K 4.0 3.6  CL 105 99  CO2 21* 25  GLUCOSE 142* 105*  BUN 14 15  CREATININE 1.05 1.01  CALCIUM 7.8* 7.8*    PT/INR:  Recent Labs    10/26/21 1627  LABPROT 18.2*  INR 1.5*   ABG    Component Value Date/Time   PHART 7.318 (L) 10/27/2021 0043   HCO3 20.1 10/27/2021 0043   TCO2 21 (L) 10/27/2021 0043   ACIDBASEDEF 6.0 (H) 10/27/2021 0043   O2SAT 71.1 10/29/2021 0449   CBG (last 3)  Recent Labs    10/28/21 1610  10/28/21 1916 10/28/21 2345  GLUCAP 90 110* 123*   CXR: ok  Assessment/Plan: S/P Procedure(s) (LRB): CORONARY ARTERY BYPASS GRAFTING (CABG) X FOUR , USING LEFT INTERNAL MAMMARY ARTERY AND RIGHT LEG GREATER SAPHENOUS VEIN HARVESTED ENDOSCOPICALLY (N/A) TRANSESOPHAGEAL ECHOCARDIOGRAM (TEE) (N/A) ENDOVEIN HARVEST OF GREATER SAPHENOUS VEIN (Right) APPLICATION OF CELL SAVER (N/A)  POD 3 Hemodynamically stable in sinus rhythm on Amio po and Lopressor. Will decrease amio to 200 bid since he had a lot of nausea yesterday. Co-ox is 71 this am off milrinone.  Wt is 185. Not sure what preop wt is. Still has mild edema so will continue diuretic today. Replace K+  Expected acute postop blood loss anemia. Start iron.  DC chest tubes, sleeve and foley today.  Continue IS, ambulation.   LOS: 8 days    Gaye Pollack 10/29/2021

## 2021-10-30 ENCOUNTER — Inpatient Hospital Stay (HOSPITAL_COMMUNITY): Payer: 59

## 2021-10-30 LAB — CBC
HCT: 23.9 % — ABNORMAL LOW (ref 39.0–52.0)
Hemoglobin: 8.3 g/dL — ABNORMAL LOW (ref 13.0–17.0)
MCH: 32.9 pg (ref 26.0–34.0)
MCHC: 34.7 g/dL (ref 30.0–36.0)
MCV: 94.8 fL (ref 80.0–100.0)
Platelets: 142 10*3/uL — ABNORMAL LOW (ref 150–400)
RBC: 2.52 MIL/uL — ABNORMAL LOW (ref 4.22–5.81)
RDW: 13.6 % (ref 11.5–15.5)
WBC: 11.8 10*3/uL — ABNORMAL HIGH (ref 4.0–10.5)
nRBC: 0 % (ref 0.0–0.2)

## 2021-10-30 LAB — BASIC METABOLIC PANEL
Anion gap: 7 (ref 5–15)
BUN: 16 mg/dL (ref 8–23)
CO2: 26 mmol/L (ref 22–32)
Calcium: 8.2 mg/dL — ABNORMAL LOW (ref 8.9–10.3)
Chloride: 102 mmol/L (ref 98–111)
Creatinine, Ser: 0.91 mg/dL (ref 0.61–1.24)
GFR, Estimated: >60 mL/min (ref >60)
Glucose, Bld: 113 mg/dL — ABNORMAL HIGH (ref 70–99)
Potassium: 3.7 mmol/L (ref 3.5–5.1)
Sodium: 135 mmol/L (ref 135–145)

## 2021-10-30 MED ORDER — METOCLOPRAMIDE HCL 5 MG/ML IJ SOLN
10.0000 mg | Freq: Two times a day (BID) | INTRAMUSCULAR | Status: DC
  Administered 2021-10-30 – 2021-10-31 (×3): 10 mg via INTRAVENOUS
  Filled 2021-10-30 (×3): qty 2

## 2021-10-30 MED ORDER — FUROSEMIDE 40 MG PO TABS
40.0000 mg | ORAL_TABLET | Freq: Every day | ORAL | Status: DC
  Administered 2021-10-31 – 2021-11-01 (×2): 40 mg via ORAL
  Filled 2021-10-30 (×2): qty 1

## 2021-10-30 MED ORDER — CLOPIDOGREL BISULFATE 75 MG PO TABS: 75.0000 mg | ORAL_TABLET | Freq: Every day | ORAL | Status: DC

## 2021-10-30 MED ORDER — MAGNESIUM HYDROXIDE 400 MG/5ML PO SUSP: 30.0000 mL | Freq: Every day | ORAL | Status: DC | PRN

## 2021-10-30 MED ORDER — ONDANSETRON HCL 4 MG PO TABS: 4.0000 mg | ORAL_TABLET | Freq: Four times a day (QID) | ORAL | Status: DC | PRN

## 2021-10-30 MED ORDER — DOCUSATE SODIUM 100 MG PO CAPS
200.0000 mg | ORAL_CAPSULE | Freq: Every day | ORAL | Status: DC
  Filled 2021-10-30: qty 2

## 2021-10-30 MED ORDER — POTASSIUM CHLORIDE CRYS ER 20 MEQ PO TBCR: 40.0000 meq | EXTENDED_RELEASE_TABLET | Freq: Every day | ORAL | Status: DC

## 2021-10-30 MED ORDER — ONDANSETRON HCL 4 MG/2ML IJ SOLN: 4.0000 mg | Freq: Four times a day (QID) | INTRAMUSCULAR | Status: DC | PRN

## 2021-10-30 MED ORDER — FUROSEMIDE 40 MG PO TABS: 40.0000 mg | ORAL_TABLET | Freq: Every day | ORAL | Status: DC

## 2021-10-30 MED ORDER — SODIUM CHLORIDE 0.9 % IV SOLN: 250.0000 mL | INTRAVENOUS | Status: DC | PRN

## 2021-10-30 MED ORDER — SODIUM CHLORIDE 0.9% FLUSH: 3.0000 mL | INTRAVENOUS | Status: DC | PRN

## 2021-10-30 MED ORDER — PANTOPRAZOLE SODIUM 40 MG PO TBEC
40.0000 mg | DELAYED_RELEASE_TABLET | Freq: Every day | ORAL | Status: DC
  Administered 2021-10-31 – 2021-11-01 (×2): 40 mg via ORAL
  Filled 2021-10-30 (×2): qty 1

## 2021-10-30 MED ORDER — POTASSIUM CHLORIDE CRYS ER 20 MEQ PO TBCR
20.0000 meq | EXTENDED_RELEASE_TABLET | ORAL | Status: AC
  Administered 2021-10-30 (×3): 20 meq via ORAL
  Filled 2021-10-30 (×3): qty 1

## 2021-10-30 MED ORDER — SODIUM CHLORIDE 0.9% FLUSH
3.0000 mL | Freq: Two times a day (BID) | INTRAVENOUS | Status: DC
  Administered 2021-10-30 – 2021-11-01 (×4): 3 mL via INTRAVENOUS

## 2021-10-30 MED ORDER — ~~LOC~~ CARDIAC SURGERY, PATIENT & FAMILY EDUCATION: Freq: Once | Status: DC

## 2021-10-30 NOTE — Progress Notes (Signed)
Pt ambulated 72', RA, minimal assist.  Back to bed, has been up in recliner since was transferred.

## 2021-10-30 NOTE — Anesthesia Postprocedure Evaluation (Signed)
Anesthesia Post Note  Patient: Jorge Fisher  Procedure(s) Performed: CORONARY ARTERY BYPASS GRAFTING (CABG) X FOUR , USING LEFT INTERNAL MAMMARY ARTERY AND RIGHT LEG GREATER SAPHENOUS VEIN HARVESTED ENDOSCOPICALLY (Chest) TRANSESOPHAGEAL ECHOCARDIOGRAM (TEE) (Esophagus) ENDOVEIN HARVEST OF GREATER SAPHENOUS VEIN (Right: Leg Upper) APPLICATION OF CELL SAVER     Patient location during evaluation: SICU Anesthesia Type: General Level of consciousness: sedated Pain management: pain level controlled Vital Signs Assessment: post-procedure vital signs reviewed and stable Respiratory status: patient remains intubated per anesthesia plan Cardiovascular status: stable Postop Assessment: no apparent nausea or vomiting Anesthetic complications: no   No notable events documented.  Last Vitals:  Vitals:   10/30/21 0700 10/30/21 0726  BP: 115/67   Pulse: 100 (!) 101  Resp: (!) 22 20  Temp:  37.2 C  SpO2: 92% 93%    Last Pain:  Vitals:   10/30/21 0726  TempSrc: Oral  PainSc: 0-No pain                 Laela Deviney

## 2021-10-30 NOTE — Progress Notes (Signed)
Order to discontinue epicardial pacing wires released with pt transfer.  Macarthur Critchley PA called to clarify since it is so late in the day.  Received instructions to not pull tonight, but plan to discontinue wires in the morning instead.  Also received verbal order to discontinue plavix so that pt does not inadvertently receive prior to wires being pulled in the morning.  [facility] will reorder tomorrow.

## 2021-10-30 NOTE — Progress Notes (Signed)
Pt transferred to 4E from [place].  Pt assessment complete, VS WNL.  Telemetry applied, CCMD notified.  Pt oriented to room and call bell. No needs at this time.

## 2021-10-30 NOTE — Progress Notes (Addendum)
° °   °  [address]       [place], [phone]             [phone]       4 Days Post-Op Procedure(s) (LRB): CORONARY ARTERY BYPASS GRAFTING (CABG) X FOUR , USING LEFT INTERNAL MAMMARY ARTERY AND RIGHT LEG GREATER SAPHENOUS VEIN HARVESTED ENDOSCOPICALLY (N/A) TRANSESOPHAGEAL ECHOCARDIOGRAM (TEE) (N/A) ENDOVEIN HARVEST OF GREATER SAPHENOUS VEIN (Right) APPLICATION OF CELL SAVER (N/A) Subjective:  Continues to make progress. Nausea resolved. Tolerating clear liquids but appetite still poor. BM x 2 yesterday.     Objective: Vital signs in last 24 hours: Temp:  [97.9 F (36.6 C)-98.9 F (37.2 C)] 98.9 F (37.2 C) (02/13 0726) Pulse Rate:  [83-112] 101 (02/13 0726) Cardiac Rhythm: Normal sinus rhythm;Sinus tachycardia (02/13 0726) Resp:  [8-27] 20 (02/13 0726) BP: (100-131)/(65-87) 115/67 (02/13 0700) SpO2:  [91 %-100 %] 93 % (02/13 0726) Weight:  [82.5 kg] 82.5 kg (02/13 0500)     Intake/Output from previous day: 02/12 0701 - 02/13 0700 In: 950 [P.O.:950] Out: 1645 [Urine:1525; Chest Tube:120] Intake/Output this shift: No intake/output data recorded.  General appearance: alert and cooperative Neurologic: intact Heart: regular rate and rhythm Lungs: breath sounds are clear. Sat 92% on RA Abdomen: soft, non-tender Extremities: edema mild Wound: Aquacel dressing intact, RLE incision is dry.  Lab Results: Recent Labs    10/29/21 0449 10/30/21 0044  WBC 11.6* 11.8*  HGB 8.0* 8.3*  HCT 23.7* 23.9*  PLT 103* 142*    BMET:  Recent Labs    10/29/21 0449 10/30/21 0044  NA 133* 135  K 3.6 3.7  CL 99 102  CO2 25 26  GLUCOSE 105* 113*  BUN 15 16  CREATININE 1.01 0.91  CALCIUM 7.8* 8.2*     PT/INR:  No results for input(s): LABPROT, INR in the last 72 hours.  ABG    Component Value Date/Time   PHART 7.318 (L) 10/27/2021 0043   HCO3 20.1 10/27/2021 0043   TCO2 21 (L) 10/27/2021 0043   ACIDBASEDEF 6.0 (H) 10/27/2021 0043   O2SAT 71.1  10/29/2021 0449   CBG (last 3)  Recent Labs    10/28/21 1610 10/28/21 1916 10/28/21 2345  GLUCAP 90 110* 123*    CXR: ok  Assessment/Plan: S/P Procedure(s) (LRB): CORONARY ARTERY BYPASS GRAFTING (CABG) X FOUR , USING LEFT INTERNAL MAMMARY ARTERY AND RIGHT LEG GREATER SAPHENOUS VEIN HARVESTED ENDOSCOPICALLY (N/A) TRANSESOPHAGEAL ECHOCARDIOGRAM (TEE) (N/A) ENDOVEIN HARVEST OF GREATER SAPHENOUS VEIN (Right) APPLICATION OF CELL SAVER (N/A)  -POD 4 CABG x 4 after presenting with acute NSTEMI, EF 40-45%.  Remains hemodynamically stable. On ASA, statin, and Lopressor.  Plan to transfer to 4E [facility] today. Remove pacer wires today and add Plavix for NSTEMI tomorrow.   -Post-op atrial fibrillation- remains in SR on Amio po lopressor  -Volume excess- Wt ~4kg+. Continue diuresis.  -ENDO-No h/o DM, A1C 5.6. CBG's discontinued.   -Expected acute blood loss animia- Hct~24% and stable. On Trinsicon. Monitor.   -PULM- Mild basilar ATX and small effusions on CXR, improving oxygenation. Continue to encourage pulmonary hygiene and ambulation.   -DVT PPX-advance activity, add enoxaparin.      LOS: 9 days    Antony Odea, Hershal Coria [phone] 10/30/2021  Slowly progressing- ready for transfer to 4E Lives alone, may nee HH services No plan for oral anticoagulation  patient examined and medical record reviewed,agree with above note. Dahlia Byes 10/30/2021

## 2021-10-30 NOTE — Progress Notes (Signed)
CARDIAC REHAB PHASE I   PRE:  Rate/Rhythm: 105 ST    BP: sitting 126/72    SaO2: 93 RA  MODE:  Ambulation: 740 ft   POST:  Rate/Rhythm: 116 ST    BP: sitting 118/54     SaO2: 93 RA  Pt stood independently and ambulated with RW at quick pace. No c/o, liked walking. Return to recliner, VSS. He can walk independently, probably could go without RW. [place], ACSM 10/30/2021 1:57 PM

## 2021-10-31 ENCOUNTER — Inpatient Hospital Stay (HOSPITAL_COMMUNITY): Payer: 59

## 2021-10-31 DIAGNOSIS — M7989 Other specified soft tissue disorders: Secondary | ICD-10-CM | POA: Diagnosis not present

## 2021-10-31 LAB — CBC
HCT: 22.7 % — ABNORMAL LOW (ref 39.0–52.0)
Hemoglobin: 7.8 g/dL — ABNORMAL LOW (ref 13.0–17.0)
MCH: 32.9 pg (ref 26.0–34.0)
MCHC: 34.4 g/dL (ref 30.0–36.0)
MCV: 95.8 fL (ref 80.0–100.0)
Platelets: 180 10*3/uL (ref 150–400)
RBC: 2.37 MIL/uL — ABNORMAL LOW (ref 4.22–5.81)
RDW: 13.7 % (ref 11.5–15.5)
WBC: 9.7 10*3/uL (ref 4.0–10.5)
nRBC: 0 % (ref 0.0–0.2)

## 2021-10-31 LAB — BASIC METABOLIC PANEL
Anion gap: 8 (ref 5–15)
BUN: 16 mg/dL (ref 8–23)
CO2: 27 mmol/L (ref 22–32)
Calcium: 8.4 mg/dL — ABNORMAL LOW (ref 8.9–10.3)
Chloride: 102 mmol/L (ref 98–111)
Creatinine, Ser: 1.08 mg/dL (ref 0.61–1.24)
GFR, Estimated: >60 mL/min (ref >60)
Glucose, Bld: 102 mg/dL — ABNORMAL HIGH (ref 70–99)
Potassium: 3.8 mmol/L (ref 3.5–5.1)
Sodium: 137 mmol/L (ref 135–145)

## 2021-10-31 MED ORDER — CLOPIDOGREL BISULFATE 75 MG PO TABS
75.0000 mg | ORAL_TABLET | Freq: Every day | ORAL | Status: DC
  Administered 2021-11-01: 75 mg via ORAL
  Filled 2021-10-31: qty 1

## 2021-10-31 MED ORDER — POTASSIUM CHLORIDE CRYS ER 20 MEQ PO TBCR
20.0000 meq | EXTENDED_RELEASE_TABLET | ORAL | Status: AC
  Administered 2021-10-31 (×3): 20 meq via ORAL
  Filled 2021-10-31 (×3): qty 1

## 2021-10-31 MED FILL — Heparin Sodium (Porcine) Inj 1000 Unit/ML: Qty: 1000 | Status: AC

## 2021-10-31 MED FILL — Magnesium Sulfate Inj 50%: INTRAMUSCULAR | Qty: 10 | Status: AC

## 2021-10-31 MED FILL — Potassium Chloride Inj 2 mEq/ML: INTRAVENOUS | Qty: 40 | Status: AC

## 2021-10-31 NOTE — Progress Notes (Signed)
Epicardial pacing wires removed by Urban Gibson RN, per order. Wire ends intact. Sites intact. Vitals stable. Pt educated on bedrest and vitals protocol.

## 2021-10-31 NOTE — Progress Notes (Signed)
CARDIAC REHAB PHASE I   PRE:  Rate/Rhythm: 92 SR    BP: sitting 116/62    SaO2: 95 RA  MODE:  Ambulation: 420 ft   POST:  Rate/Rhythm: 119 ST    BP: sitting 119/67     SaO2: 95 RA   Pt moving independently. No c/o walking, no assist in hall. To recliner. Only c/o is being eager to d/c. HR elevated.  [place], ACSM 10/31/2021 2:01 PM

## 2021-10-31 NOTE — Evaluation (Signed)
Physical Therapy Evaluation Patient Details Name: Jorge Fisher MRN: [id number] DOB: [birth year] Today's Date: 10/31/2021  History of Present Illness  Patient is a 66 y/o male who presents on 2/6 with NSTEMI now s/p CABG x4 10/26/21. PMH includes CAd, HTN, ischemic cardiomyopathy.  Clinical Impression  Patient presents with impaired balance, fatigue and impaired mobility s/p above. 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 Today, pt able to perform bed mobility, transfers, gait and stair training with supervision-Mod I for safety. Pt with 1 LOB initially due to dizziness but able to recover. HR 112-120 bpm with activity. Reports "you wore me out today." Likely will progress well. Encouraged getting up and walking 3 times daily and walking to bathroom. Some mild staggering noted with ambulation which improved with distance. Education re: sternal precautions, "move in the tube," self monitoring activity and waiting to mobilize if dizzy to prevent falls. Will follow acutely to maximize independence and mobility and improve higher level balance prior to return home. Pt reports he feels at 50% of his functional baseline.       Recommendations for follow up therapy are one component of a multi-disciplinary discharge planning process, led by the attending physician.  Recommendations may be updated based on patient status, additional functional criteria and insurance authorization.  Follow Up Recommendations No PT follow up    Assistance Recommended at Discharge PRN  Patient can return home with the following  Assistance with cooking/housework    Equipment Recommendations None recommended by PT  Recommendations for Other Services       Functional Status Assessment Patient has had a recent decline in their functional status and demonstrates the ability to make significant improvements in function in a reasonable and predictable amount of time.     Precautions /  Restrictions Precautions Precautions: Sternal;Fall Precaution Booklet Issued: Yes (comment) Precaution Comments: Provided handout Restrictions Weight Bearing Restrictions: Yes Other Position/Activity Restrictions: sternal precautions      Mobility  Bed Mobility Overal bed mobility: Modified Independent             General bed mobility comments: HOB flat, no use of rails to simulate home, cues for log roll tecnique. Able to get to EOB and return to supine without assist.    Transfers Overall transfer level: Modified independent Equipment used: None               General transfer comment: Stood from chair x2 with hands on lap, no issues or LOB.    Ambulation/Gait Ambulation/Gait assistance: Supervision Gait Distance (Feet): 300 Feet Assistive device: None Gait Pattern/deviations: Step-through pattern, Decreased stride length, Staggering right, Staggering left   Gait velocity interpretation: <1.31 ft/sec, indicative of household ambulator   General Gait Details: Slow, mildly unsteady gait initially progressing to mostly steady gait towards end of ambulation bout, staggering to right/left at first but no overt LOB as pt able to catch self, reports mild dizziness.  Stairs Stairs: Yes Stairs assistance: Supervision Stair Management: One rail Left, Step to pattern, Forwards Number of Stairs: 13 General stair comments: Cues for step to gait and use of rail for support. Rest at top of stairs. "you wore my out."  Wheelchair Mobility    Modified Rankin (Stroke Patients Only)       Balance Overall balance assessment: Needs assistance Sitting-balance support: Feet supported, No upper extremity supported Sitting balance-Leahy Scale: Normal     Standing balance support: During functional activity Standing balance-Leahy Scale: Fair Standing balance comment:  Pt with LOB towards right stumbling towards wall but able to recover.                              Pertinent Vitals/Pain Pain Assessment Pain Assessment: No/denies pain    Home Living Family/patient expects to be discharged to:: Private residence Living Arrangements:  (family member will stay with him) Available Help at Discharge: Family Type of Home: House Home Access: Stairs to enter Entrance Stairs-Rails: Right Entrance Stairs-Number of Steps: 3 Alternate Level Stairs-Number of Steps: 1  flight Home Layout: Two level Home Equipment: Conservation officer, nature (2 wheels)      Prior Function Prior Level of Function : Independent/Modified Independent             Mobility Comments: Works as a Scientist, clinical (histocompatibility and immunogenetics), drives, works out at gym 4 days/week ADLs Comments: independent     Journalist, newspaper   Dominant Hand: Right    Extremity/Trunk Assessment   Upper Extremity Assessment Upper Extremity Assessment: Defer to OT evaluation    Lower Extremity Assessment Lower Extremity Assessment: Overall WFL for tasks assessed    Cervical / Trunk Assessment Cervical / Trunk Assessment: Normal  Communication   Communication: No difficulties  Cognition Arousal/Alertness: Awake/alert Behavior During Therapy: Flat affect Overall Cognitive Status: Within Functional Limits for tasks assessed                                 General Comments: Needs cues and reminders to adhere to sternal precautions with mobility.        General Comments General comments (skin integrity, edema, etc.): Education on sternal precautions and "move int he tube."    Exercises     Assessment/Plan    PT Assessment Patient needs continued PT services  PT Problem List Decreased balance;Decreased skin integrity;Decreased knowledge of precautions;Decreased safety awareness       PT Treatment Interventions Stair training;Balance training;Patient/family education    PT Goals (Current goals can be found in the Care Plan section)  Acute Rehab PT Goals Patient Stated Goal: to go home today PT Goal  Formulation: With patient Time For Goal Achievement: 11/14/21 Potential to Achieve Goals: Good    Frequency Min 3X/week     Co-evaluation               AM-PAC PT "6 Clicks" Mobility  Outcome Measure Help needed turning from your back to your side while in a flat bed without using bedrails?: None Help needed moving from lying on your back to sitting on the side of a flat bed without using bedrails?: None Help needed moving to and from a bed to a chair (including a wheelchair)?: None Help needed standing up from a chair using your arms (e.g., wheelchair or bedside chair)?: A Little Help needed to walk in hospital room?: A Little Help needed climbing 3-5 steps with a railing? : A Little 6 Click Score: 21    End of Session   Activity Tolerance: Patient tolerated treatment well;Patient limited by fatigue Patient left: in chair;with call bell/phone within reach Nurse Communication: Mobility status PT Visit Diagnosis: Unsteadiness on feet (R26.81)    Time: 3299-2426 PT Time Calculation (min) (ACUTE ONLY): 20 min   Charges:   PT Evaluation $PT Eval Moderate Complexity: 1 Mod          Marisa Severin, PT, DPT Acute Rehabilitation Services Pager [phone] Office [phone]  Marguarite Arbour A Olden Klauer 10/31/2021, 8:51 AM

## 2021-10-31 NOTE — Progress Notes (Addendum)
[address]       [place] [address]             [phone]       5 Days Post-Op Procedure(s) (LRB): CORONARY ARTERY BYPASS GRAFTING (CABG) X FOUR , USING LEFT INTERNAL MAMMARY ARTERY AND RIGHT LEG GREATER SAPHENOUS VEIN HARVESTED ENDOSCOPICALLY (N/A) TRANSESOPHAGEAL ECHOCARDIOGRAM (TEE) (N/A) ENDOVEIN HARVEST OF GREATER SAPHENOUS VEIN (Right) APPLICATION OF CELL SAVER (N/A) Subjective:  Transferred to 4E yesterday. Notes swelling in RLE with some discomfort with walking.  Tolerating PO's, no nausea.   BM before transfer yesterday.     Objective: Vital signs in last 24 hours: Temp:  [98.1 F (36.7 C)-99 F (37.2 C)] 98.1 F (36.7 C) (02/14 0734) Pulse Rate:  [65-101] 100 (02/14 0734) Cardiac Rhythm: Sinus tachycardia;Bundle branch block (02/13 1927) Resp:  [15-30] 18 (02/14 0734) BP: (110-128)/(62-106) 113/67 (02/14 0734) SpO2:  [92 %-98 %] 94 % (02/14 0734) Weight:  [80.8 kg] 80.8 kg (02/14 0500)     Intake/Output from previous day: 02/13 0701 - 02/14 0700 In: 400 [P.O.:400] Out: 825 [Urine:825] Intake/Output this shift: Total I/O In: 120 [P.O.:120] Out: -   General appearance: alert and cooperative Neurologic: intact Heart: regular rate and rhythm Lungs: breath sounds are clear. Sat 95% on RA Abdomen: soft, non-tender Extremities: moderate edema RLE, none on the left.  Wound: the sternal incision and RLE incision are well approximated and dry.   Lab Results: Recent Labs    10/30/21 0044 10/31/21 0047  WBC 11.8* 9.7  HGB 8.3* 7.8*  HCT 23.9* 22.7*  PLT 142* 180    BMET:  Recent Labs    10/30/21 0044 10/31/21 0047  NA 135 137  K 3.7 3.8  CL 102 102  CO2 26 27  GLUCOSE 113* 102*  BUN 16 16  CREATININE 0.91 1.08  CALCIUM 8.2* 8.4*     PT/INR:  No results for input(s): LABPROT, INR in the last 72 hours.  ABG    Component Value Date/Time   PHART 7.318 (L) 10/27/2021 0043   HCO3 20.1 10/27/2021 0043   TCO2  21 (L) 10/27/2021 0043   ACIDBASEDEF 6.0 (H) 10/27/2021 0043   O2SAT 71.1 10/29/2021 0449   CBG (last 3)  Recent Labs    10/28/21 1610 10/28/21 1916 10/28/21 2345  GLUCAP 90 110* 123*    CLINICAL DATA:  Chest pain, postoperative   EXAM: CHEST - 2 VIEW   COMPARISON:  Chest radiograph from one day prior.   FINDINGS: Intact sternotomy wires. Stable cardiomediastinal silhouette with top-normal heart size. No pneumothorax. Trace bilateral pleural effusions, similar. No pulmonary edema. Mild bibasilar atelectasis, slightly improved.   IMPRESSION: 1. Trace bilateral pleural effusions, similar. 2. Mild bibasilar atelectasis, slightly improved.     Electronically Signed   By: Ilona Sorrel M.D.   On: 10/31/2021 08:21  Assessment/Plan: S/P Procedure(s) (LRB): CORONARY ARTERY BYPASS GRAFTING (CABG) X FOUR , USING LEFT INTERNAL MAMMARY ARTERY AND RIGHT LEG GREATER SAPHENOUS VEIN HARVESTED ENDOSCOPICALLY (N/A) TRANSESOPHAGEAL ECHOCARDIOGRAM (TEE) (N/A) ENDOVEIN HARVEST OF GREATER SAPHENOUS VEIN (Right) APPLICATION OF CELL SAVER (N/A)  -POD 5 CABG x 4 after presenting with acute NSTEMI, EF 40-45%.  Remains hemodynamically stable. On ASA, statin, and Lopressor.  Pacer wires not removed yesterday since transfer occurred late in the day. Will remove today, begin Plavix tomorrow.    -Post-op atrial fibrillation- stable SR on Amio po lopressor for past few days.   -Volume excess- Wt gradually trending  toward pre-op baseline. Now ~2.5kg +. Continue diuresis with oral Lasix.  -ENDO-No h/o DM, A1C 5.6. CBG's discontinued.   -Expected acute blood loss animia- Hct drifting down, now ~22.7%. Seems to be tolerating OK.   On Trinsicon. Monitor.   -PULM- improving oxygenation and CXR stable.  Continue to encourage pulmonary hygiene and ambulation.   -DVT PPX- has some swelling in RLE and discomfort with ambulation. Will check a venous duplex scan today to r/o DVT.  Continue enoxaparin.      LOS: 10 days    Antony Odea, PA-C [phone] 10/31/2021   CXR clear Incisions clean, dry Will check RLE for DVT  Will send home on plavix  for presentation with Nstemi  patient examined and medical record reviewed,agree with above note. Dahlia Byes 10/31/2021

## 2021-11-01 ENCOUNTER — Other Ambulatory Visit (HOSPITAL_COMMUNITY): Payer: Self-pay

## 2021-11-01 LAB — CBC
HCT: 22.1 % — ABNORMAL LOW (ref 39.0–52.0)
Hemoglobin: 7.4 g/dL — ABNORMAL LOW (ref 13.0–17.0)
MCH: 32 pg (ref 26.0–34.0)
MCHC: 33.5 g/dL (ref 30.0–36.0)
MCV: 95.7 fL (ref 80.0–100.0)
Platelets: 224 10*3/uL (ref 150–400)
RBC: 2.31 MIL/uL — ABNORMAL LOW (ref 4.22–5.81)
RDW: 13.5 % (ref 11.5–15.5)
WBC: 7.8 10*3/uL (ref 4.0–10.5)
nRBC: 0 % (ref 0.0–0.2)

## 2021-11-01 LAB — BASIC METABOLIC PANEL
Anion gap: 7 (ref 5–15)
BUN: 13 mg/dL (ref 8–23)
CO2: 28 mmol/L (ref 22–32)
Calcium: 8.5 mg/dL — ABNORMAL LOW (ref 8.9–10.3)
Chloride: 103 mmol/L (ref 98–111)
Creatinine, Ser: 1.2 mg/dL (ref 0.61–1.24)
GFR, Estimated: >60 mL/min (ref >60)
Glucose, Bld: 101 mg/dL — ABNORMAL HIGH (ref 70–99)
Potassium: 3.7 mmol/L (ref 3.5–5.1)
Sodium: 138 mmol/L (ref 135–145)

## 2021-11-01 MED ORDER — FUROSEMIDE 40 MG PO TABS
40.0000 mg | ORAL_TABLET | Freq: Every day | ORAL | 0 refills | Status: DC
  Filled 2021-11-01: qty 3, 3d supply, fill #0

## 2021-11-01 MED ORDER — AMIODARONE HCL 200 MG PO TABS
200.0000 mg | ORAL_TABLET | Freq: Two times a day (BID) | ORAL | 2 refills | Status: DC
  Filled 2021-11-01: qty 40, 20d supply, fill #0

## 2021-11-01 MED ORDER — FE FUMARATE-B12-VIT C-FA-IFC PO CAPS
1.0000 | ORAL_CAPSULE | Freq: Two times a day (BID) | ORAL | 0 refills | Status: DC
  Filled 2021-11-01: qty 60, 30d supply, fill #0

## 2021-11-01 MED ORDER — ASPIRIN EC 81 MG PO TBEC
81.0000 mg | DELAYED_RELEASE_TABLET | Freq: Every day | ORAL | Status: DC
  Administered 2021-11-01: 81 mg via ORAL
  Filled 2021-11-01: qty 1

## 2021-11-01 MED ORDER — POTASSIUM CHLORIDE CRYS ER 20 MEQ PO TBCR
20.0000 meq | EXTENDED_RELEASE_TABLET | Freq: Every day | ORAL | 0 refills | Status: DC
  Filled 2021-11-01: qty 3, 3d supply, fill #0

## 2021-11-01 MED ORDER — POTASSIUM CHLORIDE CRYS ER 20 MEQ PO TBCR
20.0000 meq | EXTENDED_RELEASE_TABLET | Freq: Once | ORAL | Status: AC
  Administered 2021-11-01: 20 meq via ORAL
  Filled 2021-11-01: qty 1

## 2021-11-01 MED ORDER — CLOPIDOGREL BISULFATE 75 MG PO TABS
75.0000 mg | ORAL_TABLET | Freq: Every day | ORAL | 11 refills | Status: DC
  Filled 2021-11-01: qty 30, 30d supply, fill #0

## 2021-11-01 MED FILL — Lidocaine HCl (Cardiac) IV PF Soln 100 MG/5ML (2%): INTRAVENOUS | Qty: 5 | Status: AC

## 2021-11-01 MED FILL — Sodium Chloride IV Soln 0.9%: INTRAVENOUS | Qty: 2000 | Status: AC

## 2021-11-01 MED FILL — Albumin, Human Inj 5%: INTRAVENOUS | Qty: 250 | Status: AC

## 2021-11-01 MED FILL — Sodium Bicarbonate IV Soln 8.4%: INTRAVENOUS | Qty: 50 | Status: AC

## 2021-11-01 MED FILL — Mannitol IV Soln 20%: INTRAVENOUS | Qty: 500 | Status: AC

## 2021-11-01 MED FILL — Electrolyte-R (PH 7.4) Solution: INTRAVENOUS | Qty: 6000 | Status: AC

## 2021-11-01 MED FILL — Heparin Sodium (Porcine) Inj 1000 Unit/ML: INTRAMUSCULAR | Qty: 20 | Status: AC

## 2021-11-01 NOTE — Progress Notes (Signed)
[address]       [place] [address]             [phone]       6 Days Post-Op Procedure(s) (LRB): CORONARY ARTERY BYPASS GRAFTING (CABG) X FOUR , USING LEFT INTERNAL MAMMARY ARTERY AND RIGHT LEG GREATER SAPHENOUS VEIN HARVESTED ENDOSCOPICALLY (N/A) TRANSESOPHAGEAL ECHOCARDIOGRAM (TEE) (N/A) ENDOVEIN HARVEST OF GREATER SAPHENOUS VEIN (Right) APPLICATION OF CELL SAVER (N/A) Subjective:  Awake and alert with no new concerns.  Feels he is ready to return home.     Objective: Vital signs in last 24 hours: Temp:  [98.4 F (36.9 C)-98.5 F (36.9 C)] 98.5 F (36.9 C) (02/14 1940) Pulse Rate:  [86-98] 98 (02/14 1940) Cardiac Rhythm: Normal sinus rhythm;Bundle branch block (02/15 0731) Resp:  [18-20] 18 (02/14 1627) BP: (102-114)/(55-91) 111/67 (02/14 1940) SpO2:  [93 %-96 %] 96 % (02/14 1940) Weight:  [80.4 kg] 80.4 kg (02/15 0600)     Intake/Output from previous day: 02/14 0701 - 02/15 0700 In: 120 [P.O.:120] Out: 525 [Urine:525] Intake/Output this shift: No intake/output data recorded.  General appearance: alert and cooperative Neurologic: intact Heart: regular rate and rhythm Lungs: breath sounds are clear. Sat OK on RA Abdomen: soft, non-tender Extremities: minimal edema RLE, none on the left.  Wound: the sternal incision and RLE incision are well approximated and dry.   Lab Results: Recent Labs    10/31/21 0047 11/01/21 0143  WBC 9.7 7.8  HGB 7.8* 7.4*  HCT 22.7* 22.1*  PLT 180 224    BMET:  Recent Labs    10/31/21 0047 11/01/21 0143  NA 137 138  K 3.8 3.7  CL 102 103  CO2 27 28  GLUCOSE 102* 101*  BUN 16 13  CREATININE 1.08 1.20  CALCIUM 8.4* 8.5*     PT/INR:  No results for input(s): LABPROT, INR in the last 72 hours.  ABG    Component Value Date/Time   PHART 7.318 (L) 10/27/2021 0043   HCO3 20.1 10/27/2021 0043   TCO2 21 (L) 10/27/2021 0043   ACIDBASEDEF 6.0 (H) 10/27/2021 0043   O2SAT 71.1 10/29/2021  0449   CBG (last 3)  No results for input(s): GLUCAP in the last 72 hours.  Lower Venous DVT Study   Patient Name:  VINICIUS BROCKMAN  Date of Exam:   10/31/2021  Medical Rec #: [id number]       Accession #:    [id number]  Date of Birth: [birth year]       Patient Gender: M  Patient Age:   66 years  Exam Location:  [facility]  Procedure:      VAS [place] LOWER EXTREMITY VENOUS (DVT)  Referring Phys: Parks Neptune Gemini Bunte    ---------------------------------------------------------------------------  -----     Indications: Extensive swelling of right leg s/p saphenous harvest for  CABG.     Comparison Study: No prior studies.   Performing Technologist: Darlin Coco RDMS, RVT      Examination Guidelines:  A complete evaluation includes B-mode imaging, spectral Doppler, color  Doppler,  and power Doppler as needed of all accessible portions of each vessel.  Bilateral  testing is considered an integral part of a complete examination. Limited  examinations for reoccurring indications may be performed as noted. The  reflux  portion of the exam is performed with the patient in reverse  Trendelenburg.       +---------+---------------+---------+-----------+----------+--------------+    RIGHT     Compressibility Phasicity  Spontaneity Properties Thrombus  Aging   +---------+---------------+---------+-----------+----------+--------------+    CFV       Full            Yes       Yes                                       +---------+---------------+---------+-----------+----------+--------------+    SFJ       Partial         Yes       Yes                    GSV harvest       +---------+---------------+---------+-----------+----------+--------------+    FV Prox   Full                                                                +---------+---------------+---------+-----------+----------+--------------+    FV Mid    Full                                                                 +---------+---------------+---------+-----------+----------+--------------+    FV Distal Full                                                                +---------+---------------+---------+-----------+----------+--------------+    PFV       Full                                                                +---------+---------------+---------+-----------+----------+--------------+    POP       Full            Yes       Yes                                       +---------+---------------+---------+-----------+----------+--------------+    PTV       Full                                                                +---------+---------------+---------+-----------+----------+--------------+    PERO      Full                                                                +---------+---------------+---------+-----------+----------+--------------+            +----+---------------+---------+-----------+----------+--------------+  LEFT Compressibility Phasicity Spontaneity Properties Thrombus Aging   +----+---------------+---------+-----------+----------+--------------+   CFV  Full            Yes       Yes                                     +----+---------------+---------+-----------+----------+--------------+                  Summary:  RIGHT:  - There is no evidence of deep vein thrombosis in the lower extremity.     - No cystic structure found in the popliteal fossa.     - A heterogenous collection is visualized extending from the surgical site  at the proximal calf to the distal calf, likely representing hematoma.          *See table(s) above for measurements and observations.   Electronically signed by Deitra Mayo MD on 10/31/2021 at 1:52:48  PM.  Assessment/Plan: S/P Procedure(s) (LRB): CORONARY ARTERY BYPASS GRAFTING (CABG) X FOUR , USING LEFT INTERNAL MAMMARY ARTERY AND RIGHT LEG GREATER SAPHENOUS VEIN HARVESTED ENDOSCOPICALLY  (N/A) TRANSESOPHAGEAL ECHOCARDIOGRAM (TEE) (N/A) ENDOVEIN HARVEST OF GREATER SAPHENOUS VEIN (Right) APPLICATION OF CELL SAVER (N/A)  -POD 6 CABG x 4 after presenting with acute NSTEMI, EF 40-45%.  VS ands cardiac rhythm stable. On ASA, statin, and Lopressor. Adding Plavix for NSTEMI.  -Post-op atrial fibrillation- stable SR on Amio po and lopressor for past 3 days.   -Volume excess- Wt gradually trending toward pre-op baseline. Now ~2kg +. Continue diuresis with oral Lasix.  -ENDO-No h/o DM, A1C 5.6. CBG's discontinued.   -Expected acute blood loss animia- Hct drifting down, now ~22%. Seems to be tolerating OK.   On Trinsicon.   -PULM- good  oxygenation and CXR stable.  Continue to encourage pulmonary hygiene and ambulation.   -Swelling RLE evaluated with venous duplex scan yesterday--no evidence of DVT. Has some fluid in the tunnel. Much less swelling today.     LOS: 11 days    Antony Odea, [place] [phone] 11/01/2021

## 2021-11-01 NOTE — Progress Notes (Signed)
Heart Failure Nurse Navigator Progress Note  Patient given appt card with date and time of appt.  I also gave patient directions to get to HF Clinic.  He is aware and agreeable to upcoming appt.

## 2021-11-01 NOTE — Discharge Instructions (Addendum)
Discharge Instructions:  1. You may shower, please wash incisions daily with soap and water and keep dry.  If you wish to cover wounds with dressing you may do so but please keep clean and change daily.  No tub baths or swimming until incisions have completely healed.  If your incisions become red or develop any drainage please call our office at [phone]  2. No Driving until cleared by Dr. Thayer Ohm office and you are no longer using narcotic pain medications  3. Monitor your weight daily.. Please use the same scale and weigh at same time... If you gain 5-10 lbs in 48 hours with associated lower extremity swelling, please contact our office at [phone]  4. Fever of 101.5 for at least 24 hours with no source, please contact our office at [phone]  5. Activity- up as tolerated, please walk at least 3 times per day.  Avoid strenuous activity, no lifting, pushing, or pulling with your arms over 8-10 lbs for a minimum of 6 weeks  6. If any questions or concerns arise, please do not hesitate to contact our office at [phone]  Information about your medication: Plavix (anti-platelet agent)  Generic Name (Brand): clopidogrel (Plavix), once daily medication  PURPOSE: You are taking this medication along with aspirin to lower your chance of having a heart attack, stroke, or blood clots in your heart stent. These can be fatal. Plavix and aspirin help prevent platelets from sticking together and forming a clot that can block an artery or your stent.   Common SIDE EFFECTS you may experience include: bruising or bleeding more easily, shortness of breath  Do not stop taking PLAVIX without talking to the doctor who prescribes it for you. People who are treated with a stent and stop taking Plavix too soon, have a higher risk of getting a blood clot, having a heart attack, or dying. If you stop Plavix because of bleeding, or for other reasons, your risk of a heart attack or stroke may increase.    Avoid taking NSAID agents or anti-inflammatory medications such as ibuprofen, naproxen given increased bleed risk with plavix - can use acetaminophen (Tylenol) if needed for pain.  Avoid taking over the counter stomach medications omeprazole (Prilosec) or esomeprazole (Nexium) since these do interact and make plavix less effective - ask your pharmacist or doctor for alterative agents if needed for heartburn or GERD.   Tell all of your doctors and dentists that you are taking Plavix. They should talk to the doctor who prescribed Plavix for you before you have any surgery or invasive procedure.   Contact your health care provider if you experience: severe or uncontrollable bleeding, pink/red/brown urine, vomiting blood or vomit that looks like "coffee grounds", red or black stools (looks like tar), coughing up blood or blood clots ----------------------------------------------------------------------------------------------------------------------

## 2021-11-01 NOTE — Progress Notes (Signed)
Discharge instructions given. Patient verbalized understanding and all questions were answered.  ?

## 2021-11-01 NOTE — Progress Notes (Signed)
Discussed with pt IS, sternal precautions, diet, exercise, importance of meds, and CRPII. Pt receptive, not interested in CRPII as he likes to exercise on his own. Will place referral for [place] since NSTEMI. [id number] [place], ACSM 12:18 PM 11/01/2021

## 2021-11-01 NOTE — TOC Transition Note (Signed)
Transition of Care (TOC) - CM/SW Discharge Note Marvetta Gibbons RN, BSN Transitions of Care Unit 4E- RN Case Manager See Treatment Team for direct phone #    Patient Details  Name: Jorge Fisher MRN: [id number] Date of Birth: [birth year]  Transition of Care [facility]) CM/SW Contact:  Dawayne Patricia, RN Phone Number: 11/01/2021, 11:11 AM   Clinical Narrative:    Pt s/p CABG- stable for transition home today. Transition of Care Department [facility]) has reviewed patient and no TOC needs have been identified at this time.   Final next level of care: Home/Self Care Barriers to Discharge: No Barriers Identified   Patient Goals and CMS Choice     Choice offered to / list presented to : NA  Discharge Placement               Home        Discharge Plan and Services   Discharge Planning Services: NA Post Acute Care Choice: NA          DME Arranged: N/A DME Agency: NA       HH Arranged: NA HH Agency: NA        Social Determinants of Health (SDOH) Interventions     Readmission Risk Interventions Readmission Risk Prevention Plan 11/01/2021  Post Dischage Appt Complete  Medication Screening Complete  Transportation Screening Complete  Some recent data might be hidden

## 2021-11-01 NOTE — Discharge Summary (Addendum)
Physician Discharge Summary  Patient ID: Jorge Fisher MRN: [id number] DOB/AGE: Oct 01, 1955 66 y.o.  Admit date: 10/20/2021 Discharge date: 11/01/2021  Admission Diagnoses:  Coronary artery disease Acute NSTEMI Hypertension Dyslipidemia   Discharge Diagnoses:   Acute NSTEMI Hypertension Dyslipidemia S/P CABG x 4 Post-op atrial fibrillation Expected acute blood loss anemia   Discharged Condition: stable History of Present Illness:     The patient is a 66 year old male with a cardiac history that dates back to May of 2013.  He was diagnosed at that time with an acute STEMI and underwent stenting of both the LAD and circumflex coronary arteries.  Additional cardiac risk factors include hypertension.  He presented to the emergency department on 10/20/2021 with complaints of midsternal chest pressure that started earlier in the day.  Initially the pain was intermittent but became more constant over time.  He described the pain at times had severe up to 10/10.  There were no notable aggravating or alleviating symptoms.  Pain was not worse with exertion.  It was associated with some dyspnea.  He initially felt the symptoms may be indigestion.  In the emergency department it was noted that CK, MB as well as high-sensitivity troponins were significantly elevated.  EKG showed a new right bundle branch block with nonspecific ST changes.  As he ruled in for non-STEMI with a TIMI score of 6, he received full dose aspirin as well as nitroglycerin x1 and was started on heparin drip in the ER.  Cardiology consultation was obtained.  Echocardiogram was obtained showing ejection fraction by estimation of 40 to 45%.  Left ventricular diastolic parameters were indeterminate.  There were no significant valvular disease findings.  Please see the full report below.  Cardiac catheterization was also performed and this showed severe left main and three-vessel coronary artery disease.  We are asked to see the patient in  cardiothoracic surgical consultation for consideration of CABG.   Course in Hospital: Jorge Fisher remained stable following left heart catheterization.  He elected to proceed with surgery.  He was taken to the operating room on 10/26/2021 where four-vessel coronary bypass grafting was accomplished.  The left internal mammary artery was grafted to the left anterior descending coronary artery.  Separate saphenous vein grafts were placed to the first diagonal, obtuse marginal, and posterior descending coronary arteries.  Following the procedure, he separated from cardiopulmonary bypass without difficulty.  He was transferred to the surgical ICU in stable condition.  His respiratory status and hemodynamics remained stable.  He was weaned from the ventilator and extubated routinely by 10 PM on the day of surgery.  On the first postoperative day, he was noted to have significant gastric dilatation on the chest x-ray.  For this reason, he was kept n.p.o.  The gastric dilation improved.  Diet was begun and slowly advance.  He had return of bowel function.  Diuresis was begun on postop day 2.  The milrinone was weaned and discontinued.  Follow-up Co. ox on the following morning was 71.  On postop day 3, the chest tubes were removed.  Activity was advanced.  He developed atrial fibrillation and was treated with oral amiodarone and metoprolol with resolution to normal sinus rhythm. He had some swelling in his right lower extremity that was evaluated with a venous duplex scan that was negative for DVT but did show some fluid in the RLE [facility] tunnel. The leg swelling improved with elevation. All incisions were healing with no sign of complication at the time  of discharge.  After the pacing wires were removed, he was started on Plavix since he presented with an acute NSTEMI. His hematocrit was 22 at the time of discharge but this was stable and eh was tolerating it well. He is discharged on an iron / multi-vitamin supplement. By  the time of discharge, he was ambulating independently and tolerating a cardiac diet without difficulty.    Consults: None  Significant Diagnostic Studies:   CLINICAL DATA:  Chest pain, postoperative   EXAM: CHEST - 2 VIEW   COMPARISON:  Chest radiograph from one day prior.   FINDINGS: Intact sternotomy wires. Stable cardiomediastinal silhouette with top-normal heart size. No pneumothorax. Trace bilateral pleural effusions, similar. No pulmonary edema. Mild bibasilar atelectasis, slightly improved.   IMPRESSION: 1. Trace bilateral pleural effusions, similar. 2. Mild bibasilar atelectasis, slightly improved.     Electronically Signed   By: Ilona Sorrel M.D.   On: 10/31/2021 08:21  Treatments: Surgery  Operative Report    DATE OF PROCEDURE: 10/26/2021   PROCEDURES PERFORMED:    1.  Coronary artery bypass grafting x 4 (left internal mammary artery to LAD, saphenous vein graft to diagonal, saphenous vein graft to the obtuse marginal, saphenous vein graft to posterior descending). 2.  Endoscopic harvest of right leg greater saphenous vein.   SURGEON:  Len Childs, MD   ASSISTANT:  Enid Cutter, PA-C.     PREOPERATIVE DIAGNOSES:  Severe 3-vessel coronary artery disease, non-ST elevation MI, mild LV dysfunction, history of previous PCI of the LAD and circumflex with chronic occlusion of the RCA.   POSTOPERATIVE DIAGNOSES:  Severe 3-vessel coronary artery disease, non-ST elevation MI, mild LV dysfunction, history of previous PCI of the LAD and circumflex with chronic occlusion of the RCA.    Discharge Exam: Blood pressure 102/64, pulse 90, temperature 98.2 F (36.8 C), temperature source Oral, resp. rate 19, height 5\' 10"  (1.778 m), weight 80.4 kg, SpO2 93 %.  General appearance: alert and cooperative Neurologic: intact Heart: regular rate and rhythm Lungs: breath sounds are clear. Sat OK on RA Abdomen: soft, non-tender Extremities: minimal edema RLE,  none on the left.  Wound: the sternal incision and RLE incision are well approximated and dry.   Disposition:   Discharge Instructions     AMB Referral to [facility] Pharm-D   Complete by: As directed    Reason For Referral: Lipids      Allergies as of 11/01/2021       Reactions   Peanuts [peanut Oil] Swelling   cashews        Medication List     STOP taking these medications    Effient 10 MG Tabs tablet Generic drug: prasugrel   lisinopril 2.5 MG tablet Commonly known as: ZESTRIL   multivitamin with minerals tablet       TAKE these medications    amiodarone 200 MG tablet Commonly known as: PACERONE Take 1 tablet (200 mg total) by mouth 2 (two) times daily. For 10 days then reduce the dose to 1 tablet (200mg ) by mouth daily.   aspirin EC 81 MG tablet Take 81 mg by mouth daily. Swallow whole.   atorvastatin 80 MG tablet Commonly known as: LIPITOR Take 1 tablet (80 mg total) by mouth at bedtime.   cholecalciferol 1000 units tablet Commonly known as: VITAMIN D Take 1,000 Units by mouth daily.   CINNAMON PO Take 1 tablet by mouth daily.   clopidogrel 75 MG tablet Commonly known as: PLAVIX Take 1  tablet (75 mg total) by mouth daily. Start taking on: November 02, 2021   ferrous [id number] C-folic acid capsule Commonly known as: TRINSICON / FOLTRIN Take 1 capsule by mouth 2 (two) times daily after a meal.   Fish Oil 1000 MG Caps Take 1 capsule by mouth 2 (two) times daily.   furosemide 40 MG tablet Commonly known as: LASIX Take 1 tablet (40 mg total) by mouth daily for 3 days. Start taking on: November 02, 4740   Garlic 595 MG Tabs Take 1 tablet by mouth daily.   MAGNESIUM PO Take 1 tablet by mouth daily.   metoprolol tartrate 25 MG tablet Commonly known as: LOPRESSOR TAKE ONE-HALF TABLET BY MOUTH TWICE DAILY   potassium chloride SA 20 MEQ tablet Commonly known as: KLOR-CON M Take 1 tablet (20 mEq total) by mouth daily.    vitamin C 1000 MG tablet Take 1,000 mg by mouth daily.        Follow-up Information     Richardson Dopp T, PA-C. Go on 11/21/2021.   Specialties: Cardiology, Physician Assistant Why: Your appointment is at 2:20pm. Contact information: [address] [place] [address] [phone]         Dahlia Byes, MD. Go on 11/27/2021.   Specialty: Cardiothoracic Surgery Why: Your appointment is at 3:30pm. Please arrive 65min early for a chest x-ray to be performed by [facility] located on the first floor of the same building.               The patient has been discharged on:   1.Beta Blocker:  Yes [ x  ]                              No   [   ]                              If No, reason:  2.Ace Inhibitor/ARB: Yes [   ]                                     No  [  x  ]                                     If No, reason: Labile BP  3.Statin:   Yes [  x ]                  No  [   ]                  If No, reason:  4.Ecasa:  Yes  [  x ]                  No   [   ]                  If No, reason:   5: P2Y12 Inhibitor:   yes (Acute NSTEMI)   Signed: Antony Odea, PA-C 11/01/2021, 9:53 AM  DC instructions for meds, activities and wound care reviewed with patient patient examined and medical record reviewed,agree with above note. Dahlia Byes 11/02/2021

## 2021-11-01 NOTE — Progress Notes (Signed)
Chest tube sutures removed, per order. Benzoin and steri-strips applied. Sites clean and dry.  

## 2021-11-06 ENCOUNTER — Other Ambulatory Visit: Payer: Self-pay

## 2021-11-06 ENCOUNTER — Ambulatory Visit (INDEPENDENT_AMBULATORY_CARE_PROVIDER_SITE_OTHER): Payer: Self-pay | Admitting: Surgical

## 2021-11-06 ENCOUNTER — Telehealth: Payer: Self-pay | Admitting: *Deleted

## 2021-11-06 VITALS — BP 130/74 | HR 91 | Temp 100.2°F | Resp 20 | Ht 70.0 in | Wt 174.0 lb

## 2021-11-06 DIAGNOSIS — Z5189 Encounter for other specified aftercare: Secondary | ICD-10-CM

## 2021-11-06 DIAGNOSIS — Z951 Presence of aortocoronary bypass graft: Secondary | ICD-10-CM

## 2021-11-06 NOTE — Patient Instructions (Signed)
Change dressing as needed

## 2021-11-06 NOTE — Progress Notes (Signed)
[address]       [place] [address]             [phone]      Jessie W Lada Holdrege Medical Record #[id number] Date of Birth: 05/15/1956  Referring: Jettie Booze, MD Primary Care: Pcp, No Primary Cardiologist: Larae Grooms, MD   Chief Complaint:   POST OP FOLLOW UP DATE OF PROCEDURE: 10/26/2021   PROCEDURES PERFORMED:    1.  Coronary artery bypass grafting x 4 (left internal mammary artery to LAD, saphenous vein graft to diagonal, saphenous vein graft to the obtuse marginal, saphenous vein graft to posterior descending). 2.  Endoscopic harvest of right leg greater saphenous vein.   SURGEON:  Len Childs, MD   ASSISTANT:  Enid Cutter, PA-C.     PREOPERATIVE DIAGNOSES:  Severe 3-vessel coronary artery disease, non-ST elevation MI, mild LV dysfunction, history of previous PCI of the LAD and circumflex with chronic occlusion of the RCA.   POSTOPERATIVE DIAGNOSES:  Severe 3-vessel coronary artery disease, non-ST elevation MI, mild LV dysfunction, history of previous PCI of the LAD and circumflex with chronic occlusion of the RCA.   History of Present Illness:    Patient called the office on today's date with a complaint of continuous oozing from his EVH incision near the ankle on the right leg.  He is otherwise feeling very well.      Past Medical History:  Diagnosis Date   CAD (coronary artery disease)    a) diagnosed with acute anterior STEMI with totally occluded LAD s/p PTCA/DES x 2 to prox and mid LAD 02/08/12, staged PTCA/DES to Cx 02/12/12, diagonal dz for med rx, & chronically occ RCA with collaterals.    Colorectal polyps    Hemorrhoids    Hypertension    Mild HTN in past, but more recently borderline low blood pressures   Ischemic cardiomyopathy    Improved - EF 25-30% by cath 02/08/12 then 50-55% by echo 02/11/12     Social History   Tobacco Use  Smoking Status Never  Smokeless Tobacco Not on file     Social History   Substance and Sexual Activity  Alcohol Use No     Allergies  Allergen Reactions   Peanuts [Peanut Oil] Swelling    cashews    Current Outpatient Medications  Medication Sig Dispense Refill   amiodarone (PACERONE) 200 MG tablet Take 1 tablet (200 mg total) by mouth 2 (two) times daily for 10 days then reduce the dose to 1 tablet (200mg ) by mouth daily. 40 tablet 2   Ascorbic Acid (VITAMIN C) 1000 MG tablet Take 1,000 mg by mouth daily.       aspirin EC 81 MG tablet Take 81 mg by mouth daily. Swallow whole.     atorvastatin (LIPITOR) 80 MG tablet Take 1 tablet (80 mg total) by mouth at bedtime. (Patient not taking: Reported on 10/22/2021) 30 tablet 6   cholecalciferol (VITAMIN D) 1000 UNITS tablet Take 1,000 Units by mouth daily.     CINNAMON PO Take 1 tablet by mouth daily.     clopidogrel (PLAVIX) 75 MG tablet Take 1 tablet (75 mg total) by mouth daily. 30 tablet 11   ferrous [id number] C-folic acid (TRINSICON / FOLTRIN) capsule Take 1 capsule by mouth 2 (two) times daily after a meal. 60 capsule 0   furosemide (LASIX) 40 MG tablet Take 1 tablet (40 mg total) by mouth daily for 3  days. 3 tablet 0   Garlic 130 MG TABS Take 1 tablet by mouth daily.     MAGNESIUM PO Take 1 tablet by mouth daily.     metoprolol tartrate (LOPRESSOR) 25 MG tablet TAKE ONE-HALF TABLET BY MOUTH TWICE DAILY (Patient not taking: Reported on 10/22/2021) 30 tablet 4   Omega-3 Fatty Acids (FISH OIL) 1000 MG CAPS Take 1 capsule by mouth 2 (two) times daily.     potassium chloride SA (KLOR-CON M) 20 MEQ tablet Take 1 tablet (20 mEq total) by mouth daily. 3 tablet 0   No current facility-administered medications for this visit.       Physical Exam: There were no vitals taken for this visit.  Wound: There is a moderate amount of old blood draining from the lowest knife stab incision near the right ankle.  There does not appear to be any evidence of active infection.   Diagnostic  Studies & Laboratory data:     Recent Radiology Findings:   No results found.    Recent Lab Findings: Lab Results  Component Value Date   WBC 7.8 11/01/2021   HGB 7.4 (L) 11/01/2021   HCT 22.1 (L) 11/01/2021   PLT 224 11/01/2021   GLUCOSE 101 (H) 11/01/2021   CHOL 168 10/21/2021   TRIG 40 10/21/2021   HDL 49 10/21/2021   LDLCALC 111 (H) 10/21/2021   ALT 48 (H) 10/24/2021   AST 74 (H) 10/24/2021   NA 138 11/01/2021   K 3.7 11/01/2021   CL 103 11/01/2021   CREATININE 1.20 11/01/2021   BUN 13 11/01/2021   CO2 28 11/01/2021   TSH 0.709 10/24/2021   INR 1.5 (H) 10/26/2021   HGBA1C 5.6 10/21/2021      Assessment / Plan: Old blood in the [facility] tunnel that is spontaneously draining.  It is definitely a nuisance for the patient but it does need to have a place to go, therefore suturing could potentially make it an active nidus for infection.  Continue to change dressing as needed and observe closely for redness or any evidence of purulence.      Medication Changes: No orders of the defined types were placed in this encounter.     John Giovanni, PA-C  11/06/2021 3:28 PM

## 2021-11-06 NOTE — Telephone Encounter (Signed)
Patient contacted the office stating his EVH leg is bleeding s/p CABG 2/9 by Dr. Prescott Gum. Per patient, the bleeding is characterized as deep red blood. Occurs sporadically day and night and has gone through bandages. Per patient, surgical site is slightly red. States legs are swollen despite elevation. Appt scheduled for patient to be evaluated in clinic today by PA. Patient verbalizes understanding and is aware of appt time.

## 2021-11-07 NOTE — Progress Notes (Incomplete)
HEART & VASCULAR TRANSITION OF CARE CONSULT NOTE     Referring Physician:Dr Vantrigt  Primary Care: None  Primary Cardiologist:  HPI: Referred to clinic by Dr Darcey Nora  for heart failure consultation.   Jorge Fisher  66 year old male with a history of HTN, CAD dating back to 2013.  He was diagnosed at that time with an acute STEMI and underwent stenting of both the LAD and circumflex coronary arteries.    Presented with chest pain in the setting of STEMI. Had cath with multivessel disease. Underwent CABG x4.  Developed A fib.   Overall feeling fine. Denies SOB/PND/Orthopnea. Appetite ok. No fever or chills. Weight at home 170 pounds. Taking all medications  Cardiac Testing  Echo 10/2021 EF 40-45%    Review of Systems: [y] = yes, [ ]  = no   General: Weight gain [ ] ; Weight loss [ ] ; Anorexia [ ] ; Fatigue [ ] ; Fever [ ] ; Chills [ ] ; Weakness [ ]   Cardiac: Chest pain/pressure [ ] ; Resting SOB [ ] ; Exertional SOB [ ] ; Orthopnea [ ] ; Pedal Edema [ ] ; Palpitations [ ] ; Syncope [ ] ; Presyncope [ ] ; Paroxysmal nocturnal dyspnea[ ]   Pulmonary: Cough [ ] ; Wheezing[ ] ; Hemoptysis[ ] ; Sputum [ ] ; Snoring [ ]   GI: Vomiting[ ] ; Dysphagia[ ] ; Melena[ ] ; Hematochezia [ ] ; Heartburn[ ] ; Abdominal pain [ ] ; Constipation [ ] ; Diarrhea [ ] ; BRBPR [ ]   GU: Hematuria[ ] ; Dysuria [ ] ; Nocturia[ ]   Vascular: Pain in legs with walking [ ] ; Pain in feet with lying flat [ ] ; Non-healing sores [ ] ; Stroke [ ] ; TIA [ ] ; Slurred speech [ ] ;  Neuro: Headaches[ ] ; Vertigo[ ] ; Seizures[ ] ; Paresthesias[ ] ;Blurred vision [ ] ; Diplopia [ ] ; Vision changes [ ]   Ortho/Skin: Arthritis [ ] ; Joint pain [ ] ; Muscle pain [ ] ; Joint swelling [ ] ; Back Pain [ ] ; Rash [ ]   Psych: Depression[ ] ; Anxiety[ ]   Heme: Bleeding problems [ ] ; Clotting disorders [ ] ; Anemia [ ]   Endocrine: Diabetes [ ] ; Thyroid dysfunction[ ]    Past Medical History:  Diagnosis Date   CAD (coronary artery disease)    a) diagnosed with acute  anterior STEMI with totally occluded LAD s/p PTCA/DES x 2 to prox and mid LAD 02/08/12, staged PTCA/DES to Cx 02/12/12, diagonal dz for med rx, & chronically occ RCA with collaterals.    Colorectal polyps    Hemorrhoids    Hypertension    Mild HTN in past, but more recently borderline low blood pressures   Ischemic cardiomyopathy    Improved - EF 25-30% by cath 02/08/12 then 50-55% by echo 02/11/12    Current Outpatient Medications  Medication Sig Dispense Refill   amiodarone (PACERONE) 200 MG tablet Take 1 tablet (200 mg total) by mouth 2 (two) times daily for 10 days then reduce the dose to 1 tablet (200mg ) by mouth daily. 40 tablet 2   Ascorbic Acid (VITAMIN C) 1000 MG tablet Take 1,000 mg by mouth daily.       aspirin EC 81 MG tablet Take 81 mg by mouth daily. Swallow whole.     atorvastatin (LIPITOR) 80 MG tablet Take 1 tablet (80 mg total) by mouth at bedtime. (Patient not taking: Reported on 10/22/2021) 30 tablet 6   cholecalciferol (VITAMIN D) 1000 UNITS tablet Take 1,000 Units by mouth daily.     CINNAMON PO Take 1 tablet by mouth daily.     clopidogrel (PLAVIX) 75 MG tablet Take 1  tablet (75 mg total) by mouth daily. 30 tablet 11   ferrous [id number] C-folic acid (TRINSICON / FOLTRIN) capsule Take 1 capsule by mouth 2 (two) times daily after a meal. 60 capsule 0   furosemide (LASIX) 40 MG tablet Take 1 tablet (40 mg total) by mouth daily for 3 days. 3 tablet 0   Garlic 536 MG TABS Take 1 tablet by mouth daily.     MAGNESIUM PO Take 1 tablet by mouth daily.     metoprolol tartrate (LOPRESSOR) 25 MG tablet TAKE ONE-HALF TABLET BY MOUTH TWICE DAILY 30 tablet 4   Omega-3 Fatty Acids (FISH OIL) 1000 MG CAPS Take 1 capsule by mouth 2 (two) times daily.     potassium chloride SA (KLOR-CON M) 20 MEQ tablet Take 1 tablet (20 mEq total) by mouth daily. 3 tablet 0   No current facility-administered medications for this visit.    Allergies  Allergen Reactions   Peanuts [Peanut Oil]  Swelling    cashews      Social History   Socioeconomic History   Marital status: Single    Spouse name: Not on file   Number of children: Not on file   Years of education: Not on file   Highest education level: Not on file  Occupational History   Not on file  Tobacco Use   Smoking status: Never   Smokeless tobacco: Not on file  Substance and Sexual Activity   Alcohol use: No   Drug use: No   Sexual activity: Not on file  Other Topics Concern   Not on file  Social History Narrative   Not on file   Social Determinants of Health   Financial Resource Strain: Not on file  Food Insecurity: Not on file  Transportation Needs: Not on file  Physical Activity: Not on file  Stress: Not on file  Social Connections: Not on file  Intimate Partner Violence: Not on file      Family History  Problem Relation Age of Onset   Heart attack Brother 35   Heart disease Father 54   Diabetes type II Sister    Diabetes type II Brother     There were no vitals filed for this visit.  PHYSICAL EXAM: General:  Well appearing. No respiratory difficulty HEENT: normal Neck: supple. no JVD. Carotids 2+ bilat; no bruits. No lymphadenopathy or thryomegaly appreciated. Cor: PMI nondisplaced. Regular rate & rhythm. No rubs, gallops or murmurs. Lungs: clear Abdomen: soft, nontender, nondistended. No hepatosplenomegaly. No bruits or masses. Good bowel sounds. Extremities: no cyanosis, clubbing, rash, edema Neuro: alert & oriented x 3, cranial nerves grossly intact. moves all 4 extremities w/o difficulty. Affect pleasant.  ECG:   ASSESSMENT & PLAN: Chronic HFmEF, ICM  Echo EF 40-45%  NYHA *** GDMT  Diuretic- On Toprol XL 25 mg daily  Ace/ARB/ARNI MRA SGLT2i  2.S/P CABG x4  3. PAF  EKG Today  Amio    Check CBC BMET and EKG     Referred to HFSW (PCP, Medications, Transportation, ETOH Abuse, Drug Abuse, Insurance, Financial ): Yes or No Refer to Pharmacy: Yes or No Refer to  Home Health: Yes on No Refer to Advanced Heart Failure Clinic: Yes or no  Refer to General Cardiology: Yes or No  Follow up

## 2021-11-08 ENCOUNTER — Encounter (HOSPITAL_COMMUNITY): Payer: 59

## 2021-11-08 ENCOUNTER — Telehealth (HOSPITAL_COMMUNITY): Payer: Self-pay

## 2021-11-08 ENCOUNTER — Other Ambulatory Visit (HOSPITAL_COMMUNITY): Payer: Self-pay

## 2021-11-08 NOTE — Telephone Encounter (Signed)
Transitions of Care Pharmacy  ° °Call attempted for a pharmacy transitions of care follow-up. HIPAA appropriate voicemail was left with call back information provided.  ° °Call attempt #1. Will follow-up in 2-3 days.  °  °

## 2021-11-09 ENCOUNTER — Encounter (HOSPITAL_COMMUNITY): Payer: Self-pay

## 2021-11-09 ENCOUNTER — Emergency Department (HOSPITAL_COMMUNITY): Payer: 59

## 2021-11-09 ENCOUNTER — Inpatient Hospital Stay (HOSPITAL_COMMUNITY)
Admission: EM | Admit: 2021-11-09 | Discharge: 2021-11-13 | DRG: 811 | Disposition: A | Discharge status: Home / Self Care | Payer: 59 | Attending: Internal Medicine | Admitting: Internal Medicine

## 2021-11-09 DIAGNOSIS — R55 Syncope and collapse: Secondary | ICD-10-CM | POA: Diagnosis present

## 2021-11-09 DIAGNOSIS — Z951 Presence of aortocoronary bypass graft: Secondary | ICD-10-CM

## 2021-11-09 DIAGNOSIS — Z7982 Long term (current) use of aspirin: Secondary | ICD-10-CM | POA: Diagnosis not present

## 2021-11-09 DIAGNOSIS — Z8719 Personal history of other diseases of the digestive system: Secondary | ICD-10-CM

## 2021-11-09 DIAGNOSIS — D649 Anemia, unspecified: Secondary | ICD-10-CM | POA: Diagnosis present

## 2021-11-09 DIAGNOSIS — Z8249 Family history of ischemic heart disease and other diseases of the circulatory system: Secondary | ICD-10-CM

## 2021-11-09 DIAGNOSIS — X58XXXA Exposure to other specified factors, initial encounter: Secondary | ICD-10-CM | POA: Diagnosis present

## 2021-11-09 DIAGNOSIS — E872 Acidosis, unspecified: Secondary | ICD-10-CM

## 2021-11-09 DIAGNOSIS — R9431 Abnormal electrocardiogram [ECG] [EKG]: Secondary | ICD-10-CM

## 2021-11-09 DIAGNOSIS — Z79899 Other long term (current) drug therapy: Secondary | ICD-10-CM | POA: Diagnosis not present

## 2021-11-09 DIAGNOSIS — I252 Old myocardial infarction: Secondary | ICD-10-CM

## 2021-11-09 DIAGNOSIS — K2971 Gastritis, unspecified, with bleeding: Secondary | ICD-10-CM | POA: Diagnosis not present

## 2021-11-09 DIAGNOSIS — E785 Hyperlipidemia, unspecified: Secondary | ICD-10-CM | POA: Diagnosis present

## 2021-11-09 DIAGNOSIS — R680 Hypothermia, not associated with low environmental temperature: Secondary | ICD-10-CM | POA: Diagnosis present

## 2021-11-09 DIAGNOSIS — T148XXA Other injury of unspecified body region, initial encounter: Secondary | ICD-10-CM

## 2021-11-09 DIAGNOSIS — I10 Essential (primary) hypertension: Secondary | ICD-10-CM | POA: Diagnosis present

## 2021-11-09 DIAGNOSIS — I255 Ischemic cardiomyopathy: Secondary | ICD-10-CM | POA: Diagnosis present

## 2021-11-09 DIAGNOSIS — Z91010 Allergy to peanuts: Secondary | ICD-10-CM

## 2021-11-09 DIAGNOSIS — Z7902 Long term (current) use of antithrombotics/antiplatelets: Secondary | ICD-10-CM | POA: Diagnosis not present

## 2021-11-09 DIAGNOSIS — I480 Paroxysmal atrial fibrillation: Secondary | ICD-10-CM | POA: Diagnosis present

## 2021-11-09 DIAGNOSIS — N179 Acute kidney failure, unspecified: Secondary | ICD-10-CM | POA: Diagnosis present

## 2021-11-09 DIAGNOSIS — I214 Non-ST elevation (NSTEMI) myocardial infarction: Secondary | ICD-10-CM | POA: Diagnosis present

## 2021-11-09 DIAGNOSIS — R001 Bradycardia, unspecified: Secondary | ICD-10-CM | POA: Diagnosis present

## 2021-11-09 DIAGNOSIS — I959 Hypotension, unspecified: Secondary | ICD-10-CM | POA: Diagnosis present

## 2021-11-09 DIAGNOSIS — D62 Acute posthemorrhagic anemia: Principal | ICD-10-CM | POA: Diagnosis present

## 2021-11-09 DIAGNOSIS — K920 Hematemesis: Secondary | ICD-10-CM | POA: Diagnosis present

## 2021-11-09 DIAGNOSIS — K264 Chronic or unspecified duodenal ulcer with hemorrhage: Secondary | ICD-10-CM | POA: Diagnosis not present

## 2021-11-09 DIAGNOSIS — K922 Gastrointestinal hemorrhage, unspecified: Secondary | ICD-10-CM

## 2021-11-09 DIAGNOSIS — Z20822 Contact with and (suspected) exposure to covid-19: Secondary | ICD-10-CM | POA: Diagnosis present

## 2021-11-09 DIAGNOSIS — E861 Hypovolemia: Secondary | ICD-10-CM | POA: Diagnosis present

## 2021-11-09 DIAGNOSIS — K2970 Gastritis, unspecified, without bleeding: Secondary | ICD-10-CM | POA: Diagnosis present

## 2021-11-09 DIAGNOSIS — Z955 Presence of coronary angioplasty implant and graft: Secondary | ICD-10-CM

## 2021-11-09 DIAGNOSIS — I2510 Atherosclerotic heart disease of native coronary artery without angina pectoris: Secondary | ICD-10-CM | POA: Diagnosis present

## 2021-11-09 DIAGNOSIS — S8011XA Contusion of right lower leg, initial encounter: Secondary | ICD-10-CM | POA: Diagnosis present

## 2021-11-09 DIAGNOSIS — K921 Melena: Secondary | ICD-10-CM | POA: Diagnosis present

## 2021-11-09 LAB — GLUCOSE, CAPILLARY
Glucose-Capillary: 122 mg/dL — ABNORMAL HIGH (ref 70–99)
Glucose-Capillary: 119 mg/dL — ABNORMAL HIGH (ref 70–99)

## 2021-11-09 LAB — PREPARE RBC (CROSSMATCH)

## 2021-11-09 LAB — CBC WITH DIFFERENTIAL/PLATELET
Abs Immature Granulocytes: 0.36 10*3/uL — ABNORMAL HIGH (ref 0.00–0.07)
Abs Immature Granulocytes: 0.19 10*3/uL — ABNORMAL HIGH (ref 0.00–0.07)
Basophils Absolute: 0 10*3/uL (ref 0.0–0.1)
Basophils Absolute: 0 10*3/uL (ref 0.0–0.1)
Basophils Relative: 0 %
Basophils Relative: 0 %
Eosinophils Absolute: 0 10*3/uL (ref 0.0–0.5)
Eosinophils Absolute: 0 10*3/uL (ref 0.0–0.5)
Eosinophils Relative: 0 %
Eosinophils Relative: 0 %
HCT: 15.5 % — ABNORMAL LOW (ref 39.0–52.0)
HCT: 21.9 % — ABNORMAL LOW (ref 39.0–52.0)
Hemoglobin: 4.8 g/dL — CL (ref 13.0–17.0)
Hemoglobin: 7.1 g/dL — ABNORMAL LOW (ref 13.0–17.0)
Immature Granulocytes: 2 %
Immature Granulocytes: 1 %
Lymphocytes Relative: 9 %
Lymphocytes Relative: 10 %
Lymphs Abs: 1.8 10*3/uL (ref 0.7–4.0)
Lymphs Abs: 2.3 10*3/uL (ref 0.7–4.0)
MCH: 32.7 pg (ref 26.0–34.0)
MCH: 30 pg (ref 26.0–34.0)
MCHC: 31 g/dL (ref 30.0–36.0)
MCHC: 32.4 g/dL (ref 30.0–36.0)
MCV: 105.4 fL — ABNORMAL HIGH (ref 80.0–100.0)
MCV: 92.4 fL (ref 80.0–100.0)
Monocytes Absolute: 1.5 10*3/uL — ABNORMAL HIGH (ref 0.1–1.0)
Monocytes Absolute: 2 10*3/uL — ABNORMAL HIGH (ref 0.1–1.0)
Monocytes Relative: 7 %
Monocytes Relative: 9 %
Neutro Abs: 16.6 10*3/uL — ABNORMAL HIGH (ref 1.7–7.7)
Neutro Abs: 18 10*3/uL — ABNORMAL HIGH (ref 1.7–7.7)
Neutrophils Relative %: 82 %
Neutrophils Relative %: 80 %
Platelets: 423 10*3/uL — ABNORMAL HIGH (ref 150–400)
Platelets: 264 10*3/uL (ref 150–400)
RBC: 1.47 MIL/uL — ABNORMAL LOW (ref 4.22–5.81)
RBC: 2.37 MIL/uL — ABNORMAL LOW (ref 4.22–5.81)
RDW: 15.7 % — ABNORMAL HIGH (ref 11.5–15.5)
RDW: 20 % — ABNORMAL HIGH (ref 11.5–15.5)
WBC: 20.3 10*3/uL — ABNORMAL HIGH (ref 4.0–10.5)
WBC: 22.5 10*3/uL — ABNORMAL HIGH (ref 4.0–10.5)
nRBC: 0.1 % (ref 0.0–0.2)
nRBC: 0.3 % — ABNORMAL HIGH (ref 0.0–0.2)

## 2021-11-09 LAB — TSH: TSH: 1.155 u[IU]/mL (ref 0.350–4.500)

## 2021-11-09 LAB — COMPREHENSIVE METABOLIC PANEL
ALT: 18 U/L (ref 0–44)
AST: 28 U/L (ref 15–41)
Albumin: 2.4 g/dL — ABNORMAL LOW (ref 3.5–5.0)
Alkaline Phosphatase: 44 U/L (ref 38–126)
Anion gap: 13 (ref 5–15)
BUN: 38 mg/dL — ABNORMAL HIGH (ref 8–23)
CO2: 18 mmol/L — ABNORMAL LOW (ref 22–32)
Calcium: 8.2 mg/dL — ABNORMAL LOW (ref 8.9–10.3)
Chloride: 105 mmol/L (ref 98–111)
Creatinine, Ser: 1.51 mg/dL — ABNORMAL HIGH (ref 0.61–1.24)
GFR, Estimated: 50 mL/min — ABNORMAL LOW (ref >60)
Glucose, Bld: 215 mg/dL — ABNORMAL HIGH (ref 70–99)
Potassium: 3.9 mmol/L (ref 3.5–5.1)
Sodium: 136 mmol/L (ref 135–145)
Total Bilirubin: 0.6 mg/dL (ref 0.3–1.2)
Total Protein: 4.9 g/dL — ABNORMAL LOW (ref 6.5–8.1)

## 2021-11-09 LAB — RESP PANEL BY RT-PCR (FLU A&B, COVID) ARPGX2
Influenza A by PCR: NEGATIVE
Influenza B by PCR: NEGATIVE
SARS Coronavirus 2 by RT PCR: NEGATIVE

## 2021-11-09 LAB — CBG MONITORING, ED: Glucose-Capillary: 216 mg/dL — ABNORMAL HIGH (ref 70–99)

## 2021-11-09 LAB — APTT: aPTT: 27 seconds (ref 24–36)

## 2021-11-09 LAB — URINALYSIS, ROUTINE W REFLEX MICROSCOPIC
Bilirubin Urine: NEGATIVE
Glucose, UA: NEGATIVE mg/dL
Hgb urine dipstick: NEGATIVE
Ketones, ur: NEGATIVE mg/dL
Leukocytes,Ua: NEGATIVE
Nitrite: NEGATIVE
Protein, ur: NEGATIVE mg/dL
Specific Gravity, Urine: 1.023 (ref 1.005–1.030)
pH: 5 (ref 5.0–8.0)

## 2021-11-09 LAB — I-STAT CHEM 8, ED
BUN: 33 mg/dL — ABNORMAL HIGH (ref 8–23)
Calcium, Ion: 1.15 mmol/L (ref 1.15–1.40)
Chloride: 106 mmol/L (ref 98–111)
Creatinine, Ser: 1.3 mg/dL — ABNORMAL HIGH (ref 0.61–1.24)
Glucose, Bld: 219 mg/dL — ABNORMAL HIGH (ref 70–99)
HCT: 15 % — ABNORMAL LOW (ref 39.0–52.0)
Hemoglobin: 5.1 g/dL — CL (ref 13.0–17.0)
Potassium: 3.8 mmol/L (ref 3.5–5.1)
Sodium: 138 mmol/L (ref 135–145)
TCO2: 19 mmol/L — ABNORMAL LOW (ref 22–32)

## 2021-11-09 LAB — POC OCCULT BLOOD, ED: Fecal Occult Bld: POSITIVE — AB

## 2021-11-09 LAB — TROPONIN I (HIGH SENSITIVITY)
Troponin I (High Sensitivity): 25 ng/L — ABNORMAL HIGH (ref <18)
Troponin I (High Sensitivity): 31 ng/L — ABNORMAL HIGH (ref <18)

## 2021-11-09 LAB — LACTIC ACID, PLASMA
Lactic Acid, Venous: 7.5 mmol/L (ref 0.5–1.9)
Lactic Acid, Venous: 2.2 mmol/L (ref 0.5–1.9)

## 2021-11-09 LAB — HEMOGLOBIN AND HEMATOCRIT, BLOOD
HCT: 23.9 % — ABNORMAL LOW (ref 39.0–52.0)
Hemoglobin: 8.1 g/dL — ABNORMAL LOW (ref 13.0–17.0)

## 2021-11-09 LAB — PROTIME-INR
INR: 1.3 — ABNORMAL HIGH (ref 0.8–1.2)
Prothrombin Time: 16.4 seconds — ABNORMAL HIGH (ref 11.4–15.2)

## 2021-11-09 LAB — BRAIN NATRIURETIC PEPTIDE: B Natriuretic Peptide: 75.7 pg/mL (ref 0.0–100.0)

## 2021-11-09 MED ORDER — ATORVASTATIN CALCIUM 80 MG PO TABS
80.0000 mg | ORAL_TABLET | Freq: Every day | ORAL | Status: DC
  Administered 2021-11-09 – 2021-11-12 (×4): 80 mg via ORAL
  Filled 2021-11-09 (×4): qty 1

## 2021-11-09 MED ORDER — ACETAMINOPHEN 650 MG RE SUPP: 650.0000 mg | Freq: Four times a day (QID) | RECTAL | Status: DC | PRN

## 2021-11-09 MED ORDER — ONDANSETRON HCL 4 MG PO TABS: 4.0000 mg | ORAL_TABLET | Freq: Four times a day (QID) | ORAL | Status: DC | PRN

## 2021-11-09 MED ORDER — SODIUM CHLORIDE 0.9 % IV SOLN
10.0000 mL/h | Freq: Once | INTRAVENOUS | Status: AC
  Administered 2021-11-09: 10 mL/h via INTRAVENOUS

## 2021-11-09 MED ORDER — METOPROLOL TARTRATE 25 MG PO TABS
12.5000 mg | ORAL_TABLET | Freq: Two times a day (BID) | ORAL | Status: DC
Start: 2021-11-09 — End: 2021-11-09

## 2021-11-09 MED ORDER — GARLIC 100 MG PO TABS: 1.0000 | ORAL_TABLET | Freq: Every day | ORAL | Status: DC

## 2021-11-09 MED ORDER — AMIODARONE HCL 200 MG PO TABS
200.0000 mg | ORAL_TABLET | Freq: Every day | ORAL | Status: DC
  Administered 2021-11-09 – 2021-11-13 (×5): 200 mg via ORAL
  Filled 2021-11-09 (×5): qty 1

## 2021-11-09 MED ORDER — AMIODARONE HCL 200 MG PO TABS: 200.0000 mg | ORAL_TABLET | Freq: Two times a day (BID) | ORAL | Status: DC

## 2021-11-09 MED ORDER — INSULIN ASPART 100 UNIT/ML IJ SOLN
0.0000 [IU] | Freq: Three times a day (TID) | INTRAMUSCULAR | Status: DC
  Administered 2021-11-09 – 2021-11-13 (×2): 2 [IU] via SUBCUTANEOUS

## 2021-11-09 MED ORDER — ONDANSETRON HCL 4 MG/2ML IJ SOLN
4.0000 mg | Freq: Four times a day (QID) | INTRAMUSCULAR | Status: DC | PRN
Start: 2021-11-09 — End: 2021-11-13

## 2021-11-09 MED ORDER — OMEGA-3-ACID ETHYL ESTERS 1 G PO CAPS
1.0000 g | ORAL_CAPSULE | Freq: Every day | ORAL | Status: DC
  Administered 2021-11-11 – 2021-11-13 (×3): 1 g via ORAL
  Filled 2021-11-09 (×3): qty 1

## 2021-11-09 MED ORDER — PANTOPRAZOLE SODIUM 40 MG IV SOLR: 40.0000 mg | INTRAVENOUS | Status: DC

## 2021-11-09 MED ORDER — FE FUMARATE-B12-VIT C-FA-IFC PO CAPS
1.0000 | ORAL_CAPSULE | Freq: Two times a day (BID) | ORAL | Status: DC
  Administered 2021-11-10 – 2021-11-13 (×6): 1 via ORAL
  Filled 2021-11-09 (×11): qty 1

## 2021-11-09 MED ORDER — ACETAMINOPHEN 325 MG PO TABS: 650.0000 mg | ORAL_TABLET | Freq: Four times a day (QID) | ORAL | Status: DC | PRN

## 2021-11-09 MED ORDER — ASCORBIC ACID 500 MG PO TABS
1000.0000 mg | ORAL_TABLET | Freq: Every day | ORAL | Status: DC
  Administered 2021-11-11 – 2021-11-13 (×3): 1000 mg via ORAL
  Filled 2021-11-09 (×3): qty 2

## 2021-11-09 MED ORDER — CINNAMON 500 MG PO CAPS: ORAL_CAPSULE | Freq: Every day | ORAL | Status: DC

## 2021-11-09 MED ORDER — VITAMIN D 25 MCG (1000 UNIT) PO TABS
1000.0000 [IU] | ORAL_TABLET | Freq: Every day | ORAL | Status: DC
  Administered 2021-11-11 – 2021-11-13 (×3): 1000 [IU] via ORAL
  Filled 2021-11-09 (×3): qty 1

## 2021-11-09 MED ORDER — PANTOPRAZOLE SODIUM 40 MG IV SOLR
40.0000 mg | Freq: Two times a day (BID) | INTRAVENOUS | Status: DC
  Administered 2021-11-09 – 2021-11-13 (×8): 40 mg via INTRAVENOUS
  Filled 2021-11-09 (×8): qty 10

## 2021-11-09 MED ORDER — MAGNESIUM 200 MG PO TABS: ORAL_TABLET | Freq: Every day | ORAL | Status: DC

## 2021-11-09 MED ORDER — LACTATED RINGERS IV SOLN: INTRAVENOUS | Status: DC

## 2021-11-09 NOTE — H&P (Addendum)
Date: 11/09/2021               Patient Name:  Jorge Fisher MRN: [id number]  DOB: Nov 21, 1955 Age / Sex: 66 y.o., male   PCP: Pcp, No         Medical Service: Internal Medicine Teaching Service         Attending Physician: Dr. Velna Ochs, MD    First Contact: Dr. Posey Pronto Pager: [phone]  Second Contact: Dr. Eulas Post Pager: [phone]       After Hours (After 5p/  First Contact Pager: [phone]  weekends / holidays): Second Contact Pager: [phone]   Chief Complaint: Shortness of breath, syncopal episode  History of Present Illness: [name] is a 66 year old man with hypertension, hyperlipidemia, ischemic cardiomyopathy and CAD who recently had a quadruple bypass on 2/3.  He presents today with dizziness, shortness of breath and diaphoresis.  On EMS arrival, he was cool, pale and diaphoretic found to be hypotensive and tachycardic.  Patient also had a syncopal episode which he does not recall but quickly regained consciousness after about 1 minute.  He had a second syncopal episode after being evaluated in the ER and at that time found to have melanotic stool with persistent hypotension and tachycardia.  He reports recovering well since his recent hospitalization up until the last few days when he started feeling lightheaded, dizzy with unsteady gait, short of breath and nauseated.  He reports 2 falls in the past 2 days without hitting his head or sustaining any other injury.  He has noticed a decreased appetite for the past 2 days as well.  He has been taking his medications as prescribed.  Denies any fevers, chills, headaches, changes in his vision, chest pain, abdominal pain, constipation or diarrhea.  States that initially when he left the hospital he was feeling constipated however his bowel movements had normalized.  He denies any blood in his stool or dark stools.  Also denies blood in his urine.  Denies any history of anemia or GI bleeds.  States he had a colonoscopy before in  the past and it was normal.  No family history of cancers that he is aware of.  His brother mentions that his sister did have a bleeding gallbladder which had to be removed.  Meds:  No current facility-administered medications on file prior to encounter.   Current Outpatient Medications on File Prior to Encounter  Medication Sig Dispense Refill   amiodarone (PACERONE) 200 MG tablet Take 1 tablet (200 mg total) by mouth 2 (two) times daily for 10 days then reduce the dose to 1 tablet (200mg ) by mouth daily. 40 tablet 2   Ascorbic Acid (VITAMIN C) 1000 MG tablet Take 1,000 mg by mouth daily.       aspirin EC 81 MG tablet Take 81 mg by mouth daily. Swallow whole.     atorvastatin (LIPITOR) 80 MG tablet Take 1 tablet (80 mg total) by mouth at bedtime. (Patient not taking: Reported on 10/22/2021) 30 tablet 6   cholecalciferol (VITAMIN D) 1000 UNITS tablet Take 1,000 Units by mouth daily.     CINNAMON PO Take 1 tablet by mouth daily.     clopidogrel (PLAVIX) 75 MG tablet Take 1 tablet (75 mg total) by mouth daily. 30 tablet 11   ferrous [id number] C-folic acid (TRINSICON / FOLTRIN) capsule Take 1 capsule by mouth 2 (two) times daily after a meal. 60 capsule 0   furosemide (LASIX) 40 MG tablet Take  1 tablet (40 mg total) by mouth daily for 3 days. 3 tablet 0   Garlic 161 MG TABS Take 1 tablet by mouth daily.     MAGNESIUM PO Take 1 tablet by mouth daily.     metoprolol tartrate (LOPRESSOR) 25 MG tablet TAKE ONE-HALF TABLET BY MOUTH TWICE DAILY 30 tablet 4   Omega-3 Fatty Acids (FISH OIL) 1000 MG CAPS Take 1 capsule by mouth 2 (two) times daily.     potassium chloride SA (KLOR-CON M) 20 MEQ tablet Take 1 tablet (20 mEq total) by mouth daily. 3 tablet 0    No outpatient medications have been marked as taking for the 11/09/21 encounter [facility] Encounter).     Allergies: Allergies as of 11/09/2021 - Review Complete 11/09/2021  Allergen Reaction Noted   Peanuts [peanut oil] Swelling  02/08/2012   Past Medical History:  Diagnosis Date   CAD (coronary artery disease)    a) diagnosed with acute anterior STEMI with totally occluded LAD s/p PTCA/DES x 2 to prox and mid LAD 02/08/12, staged PTCA/DES to Cx 02/12/12, diagonal dz for med rx, & chronically occ RCA with collaterals.    Colorectal polyps    Hemorrhoids    Hypertension    Mild HTN in past, but more recently borderline low blood pressures   Ischemic cardiomyopathy    Improved - EF 25-30% by cath 02/08/12 then 50-55% by echo 02/11/12    Family History:  Family History  Problem Relation Age of Onset   Heart attack Brother 55   Heart disease Father 69   Diabetes type II Sister    Diabetes type II Brother      Social History: Patient lives at home with his brother and sister.  He works as an Sales promotion account executive.  Denies any tobacco, alcohol or illicit drug use  Review of Systems: A complete ROS was negative except as per HPI.   Physical Exam: Blood pressure 103/65, pulse 87, temperature (!) 97.4 F (36.3 C), temperature source Oral, resp. rate 19, SpO2 100 %. Physical Exam General: alert, appears stated age, in no acute distress HEENT: Normocephalic, atraumatic, EOM intact, conjunctiva normal CV: Regular rate and rhythm, no murmurs rubs or gallops Pulm: Clear to auscultation bilaterally, normal work of breathing Abdomen: Soft, nondistended, bowel sounds present, no tenderness to palpation MSK: No lower extremity edema Skin: Warm and dry Neuro: Alert and oriented x3, intermittently somnolent on examination but easily awoken with verbal stimuli  EKG: personally reviewed my interpretation is normal sinus rhythm, right bundle branch block unchanged from prior  CXR: personally reviewed my interpretation is no acute cardiopulmonary process, median sternotomy wires from recent CABG seen  Assessment & Plan by Problem: Principal Problem:   Symptomatic anemia Active Problems:   Ischemic cardiomyopathy   CAD  (coronary artery disease)   S/P CABG x 4   Lactic acidosis   GI bleed  [name] is a 66 year old man with hypertension, hyperlipidemia, ischemic cardiomyopathy and CAD who recently had a quadruple bypass on 2/3.  He presents today with dizziness, shortness of breath, diaphoresis and a witnessed syncopal episode by EMS.  Admitted to IMTS with severe symptomatic anemia in the setting of a suspected GI bleed.  Symptomatic anemia Lactic acidosis Suspect upper GI bleed Leukocytosis Patient presented with diaphoresis, paleness and cool to touch.  Had a syncopal episode on EMS arrival and found to be hypotensive and tachycardic.  In the ER he was found to be hypothermic, hypotensive and tachycardic.  He  had another syncopal episode in the ED and was briefly unresponsive.  During this episode found to have melena in his stool. Fecal occult positive. Hemoglobin 4.8, improved to 8.1 after 2 units PRBCs.  Blood pressures have improved since arrival.  Patient denies any blood in his stool or melena.  History of colorectal polyps and hemorrhoids; last colonoscopy in 2010. Patient was recently started on aspirin and Plavix status post quadruple bypass.  I do not suspect he is having bleed or complication from his recent CABG.  No signs of cardiac tamponade on exam or EKG. Lactic acid 7.5>2.2, likely in the setting of hypovolemia and bleed.  I suspect hypothermia is in the setting of hypovolemia secondary to his bleed and leukocytosis may also be reactive. No s/sxs of infectious etiology. -GI and cardiothoracic surgery consulted, appreciate recommendations -Trend CBC -IV PPI -Trend lactic acid -Status post 2 units PRBC -Hold aspirin and Plavix -SCDs for VTE prophylaxis -Continue IV fluids -Follow-up blood and urine cultures -Recommend he remain n.p.o. until GI evaluates patient.  Can likely do clear liquids and resume n.p.o. at midnight -Continue to apply warming blanket  AKI Creatinine 1.5,  baseline 0.9-1.  Likely prerenal in the setting of GI bleed. -Trend BMP -IV fluids  CAD Recent quadruple bypass Ischemic cardiomyopathy Patient has a history of anterolateral STEMI in 2013 status post PCI LAD, staged PCI to LCx, CTO and CTO RCA.  He recently presented on 2/3 for an NSTEMI.  Echocardiogram showed an EF of 40 to 45%.  Cardiac catheterization showed severe left main and three-vessel coronary artery disease.  Patient underwent quadruple bypass and discharged home on aspirin and Plavix. -Hold aspirin and Plavix in the setting of GI bleed -Hold metoprolol 12.5 mg daily in the setting of bleed  Paroxysmal atrial fibrillation Currently in normal sinus rhythm and rate controlled.  Patient developed atrial fibrillation postoperatively from his recent CABG.  He was treated with oral amiodarone and metoprolol with resolution to normal sinus rhythm.  He was discharged to continue amiodarone and metoprolol. -Continue amiodarone 200 mg daily -Hold beta-blocker in the setting of possible GI bleed  Hypertension Hypotensive on admission, improved with blood transfusion.  Patient is normotensive.  Continue to monitor.  Hyperlipidemia -Continue atorvastatin 80 mg daily  Diet: N.p.o. VTE prophylaxis: SCD Full code  Dispo: Admit patient to Inpatient with expected length of stay greater than 2 midnights.  SignedMike Craze, DO 11/09/2021, 12:27 PM  Pager: [phone] After 5pm on weekdays and 1pm on weekends: On Call pager: [phone]

## 2021-11-09 NOTE — Progress Notes (Addendum)
° °  Subjective: Overnight events: none   Patient was seen at bedside during rounds today. Pt reports feeling well. Pt reports 1 episode of BRBPR, quantified as "a little bit". Denies CP, SHOB, fevers, and chills.   Pt is updated on the plan for today, and all questions and concerns are addressed.   Objective:  Vital signs in last 24 hours: Vitals:   11/09/21 1500 11/09/21 1515 11/09/21 1607 11/09/21 1947  BP: 120/67  111/67 123/64  Pulse: 95 94 91 91  Resp: 18  (!) 23 18  Temp: 98.3 F (36.8 C)  98.2 F (36.8 C) 98 F (36.7 C)  TempSrc: Oral  Oral Oral  SpO2: 99% 99% 96% 98%   General: alert, and in no acute distress HEENT: Normocephalic, atraumatic, EOM intact, conjunctiva normal CV: RRR, no murmurs rubs or gallops, 1+ RLE edema s/p CABG graft  Pulm: Clear to auscultation bilaterally, normal work of breathing Abdomen: Soft, nondistended, bowel sounds present, no tenderness to palpation Skin: Warm and dry, Neuro: Alert and oriented x3, follows commands   Assessment/Plan:  Principal Problem:   Symptomatic anemia Active Problems:   Ischemic cardiomyopathy   CAD (coronary artery disease)   S/P CABG x 4   Lactic acidosis   GI bleed   Symptomatic anemia from likely UGIB Lactic acidosis  Leukocytosis  Hgb improved from 4.8 (admission) to 8.1 with 2 units PRBCs but then dropped to 7.1 later that evening. Hgb 5.9 this morning; receiving 1 unit during rounds this morning and post transfusion H&H pending. GI is aware. Did have 1 episode of hematochezia last night; HDS at this time. Did consider colonic ischemia vs mesenteric ischemia given leukocytosis, lactic acidosis, and PRBPR yesterday evening, but remains low on ddx given lack of abd pain. Leukocytosis improved 22.5 -> 19.4 with fluid resuscitation. Tech smear unremarkable. No signs of infectious etiology with no systemic symptoms; BC with 1/4 bottles growing GPC with BCID detecting MSSE - presumably a contaminant; urine  cultures pending.  - GI following, appreciate assistance - EGD today at 2 pm  - CT surgery made aware of pt  - Trend CBC - IV PPI - Continue holding aspirin and Plavix - SCDs for VTE prophylaxis - Continue IV fluids  - Follow up blood cultures    AKI Creatinine improved 1.5 -> 1.24 (baseline 0.9-1) with IVF and pRBCs.  - Trend BMP - IV fluids - Avoid nephrotoxins    CAD STEMI in 2013  S/p quadruple bypass 10/20/21 Ischemic cardiomyopathy - Holding aspirin and Plavix in the setting of GI bleed - Holding metoprolol 12.5 mg daily in the setting of bleed - CT surgery made aware    Paroxysmal atrial fibrillation NSR and rate controlled.  - Continue amiodarone 200 mg daily - Holding metoprolol in the setting of possible GI bleed   Hypertension Hypotensive on admission in setting of GI bleeding. Patient is normotensive at this time .    Hyperlipidemia - continue atorvastatin 80 mg daily  Best Practice: Diet: NPO IVF: LR 100cc/hr VTE: SCDs Code: Full   Lajean Manes, MD  Internal Medicine Resident, PGY-1 Pager: [phone] After 5pm on weekdays and 1pm on weekends: On Call pager [phone]

## 2021-11-09 NOTE — ED Provider Notes (Signed)
Patient seen and examined, agree with assessment and plan by APP. 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 I was called to the room for another syncopal episode, associated with brief bradycardia, possibly some seizure-like activity but able to talk to me shortly after. He was noted to have a large melanic stool during this episode. BP has also dropped. Begin crystalloid rescuscitation. Will likely need transfusion. He is on ASA and Plavix post surgery. He has agreed to transfusion if necessary.   Clinical Course as of 11/09/21 0916  Thu Nov 09, 2021  0722 Spoke with Dr. Cyndia Bent, TCTS, who is aware of the patient and will follow.  [CS]  [id number] Emergency release blood ordered for recurrent syncope and melena. He was found to have Hgb 5.1 on I-stat.  [CS]  0809 Spoke with Dr. Henrene Pastor, [facility], who is going off call this morning, he will ensure the day team is aware. Secure chat sent to them as well.  [CS]    Clinical Course User Index [CS] Truddie Hidden, MD   .Critical Care Performed by: Truddie Hidden, MD Authorized by: Truddie Hidden, MD   Critical care provider statement:    Critical care time (minutes):  45   Critical care was necessary to treat or prevent imminent or life-threatening deterioration of the following conditions:  Shock   Critical care was time spent personally by me on the following activities:  Development of treatment plan with patient or surrogate, discussions with consultants, evaluation of patient's response to treatment, examination of patient, ordering and review of laboratory studies, ordering and review of radiographic studies, ordering and performing treatments and interventions, pulse oximetry, re-evaluation of patient's condition and review of old charts   Care discussed with: admitting provider      Truddie Hidden, MD 11/09/21 [phone]

## 2021-11-09 NOTE — ED Triage Notes (Signed)
Pt comes via [facility] for  SOB/ NV, pt had syncopal episode with EMS, hx of bypass several weeks ago.

## 2021-11-09 NOTE — ED Provider Notes (Signed)
[facility] EMERGENCY DEPARTMENT Provider Note   CSN: [id number] Arrival date & time: 11/09/21  7564     History  Chief Complaint  Patient presents with   Loss of Consciousness    Jorge Fisher is a 66 y.o. male.  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 EMS reported patient to be pale, cool, diaphoretic with syncopal episode, placed on pads and [place] and transported to the ER. Patient denies CP, abdominal pain.  Patient's brother at the bedside, states he is not pale, does have light skin.      Home Medications Prior to Admission medications   Medication Sig Start Date End Date Taking? Authorizing Provider  amiodarone (PACERONE) 200 MG tablet Take 1 tablet (200 mg total) by mouth 2 (two) times daily for 10 days then reduce the dose to 1 tablet (200mg ) by mouth daily. 11/01/21   Antony Odea, PA-C  Ascorbic Acid (VITAMIN C) 1000 MG tablet Take 1,000 mg by mouth daily.      [provider]  aspirin EC 81 MG tablet Take 81 mg by mouth daily. Swallow whole.    [provider]  atorvastatin (LIPITOR) 80 MG tablet Take 1 tablet (80 mg total) by mouth at bedtime. Patient not taking: Reported on 10/22/2021 09/04/12 09/04/13  Burnell Blanks, MD  cholecalciferol (VITAMIN D) 1000 UNITS tablet Take 1,000 Units by mouth daily.    [provider]  CINNAMON PO Take 1 tablet by mouth daily.    [provider]  clopidogrel (PLAVIX) 75 MG tablet Take 1 tablet (75 mg total) by mouth daily. 11/02/21   Antony Odea, PA-C  ferrous [id number] C-folic acid (TRINSICON / FOLTRIN) capsule Take 1 capsule by mouth 2 (two) times daily after a meal. 11/01/21   Roddenberry, Arlis Porta, PA-C  furosemide (LASIX) 40 MG tablet Take 1 tablet (40 mg total) by mouth daily for 3 days. 11/02/21 11/05/21   Antony Odea, PA-C  Garlic 010 MG TABS Take 1 tablet by mouth daily.    [provider]  MAGNESIUM PO Take 1 tablet by mouth daily.    [provider]  metoprolol tartrate (LOPRESSOR) 25 MG tablet TAKE ONE-HALF TABLET BY MOUTH TWICE DAILY 12/08/12   Burnell Blanks, MD  Omega-3 Fatty Acids (FISH OIL) 1000 MG CAPS Take 1 capsule by mouth 2 (two) times daily.    [provider]  potassium chloride SA (KLOR-CON M) 20 MEQ tablet Take 1 tablet (20 mEq total) by mouth daily. 11/01/21   Antony Odea, PA-C      Allergies    Peanuts [peanut oil]    Review of Systems   Review of Systems Negative except as per HPI Physical Exam Updated Vital Signs BP 111/67    Pulse 85    Temp (!) 97.4 F (36.3 C) (Oral)    Resp 17    SpO2 98%  Physical Exam Vitals and nursing note reviewed.  Constitutional:      Appearance: He is ill-appearing.  HENT:     Head: Normocephalic and atraumatic.     Nose: Nose normal.     Mouth/Throat:     Mouth: Mucous membranes are moist.  Eyes:     Extraocular Movements: Extraocular movements intact.     Pupils: Pupils are equal, round, and reactive to light.  Comments: Conjunctiva pale  Cardiovascular:     Rate and Rhythm: Regular rhythm. Tachycardia present.     Heart sounds: Normal heart sounds.  Pulmonary:     Breath sounds: Normal breath sounds.  Chest:     Comments: Midline incision chest, healing without evidence of infection Abdominal:     Palpations: Abdomen is soft.     Tenderness: There is no abdominal tenderness.  Musculoskeletal:     Cervical back: Neck supple.     Right lower leg: No edema.     Left lower leg: No edema.  Skin:    General: Skin is warm and dry.     Coloration: Skin is pale.  Neurological:     Mental Status: He is alert and oriented to person, place, and time.  Psychiatric:        Behavior: Behavior normal.    ED Results / Procedures / Treatments   Labs (all labs ordered are  listed, but only abnormal results are displayed) Labs Reviewed  LACTIC ACID, PLASMA - Abnormal; Notable for the following components:      Result Value   Lactic Acid, Venous 7.5 (*)    All other components within normal limits  COMPREHENSIVE METABOLIC PANEL - Abnormal; Notable for the following components:   CO2 18 (*)    Glucose, Bld 215 (*)    BUN 38 (*)    Creatinine, Ser 1.51 (*)    Calcium 8.2 (*)    Total Protein 4.9 (*)    Albumin 2.4 (*)    GFR, Estimated 50 (*)    All other components within normal limits  CBC WITH DIFFERENTIAL/PLATELET - Abnormal; Notable for the following components:   WBC 20.3 (*)    RBC 1.47 (*)    Hemoglobin 4.8 (*)    HCT 15.5 (*)    MCV 105.4 (*)    RDW 15.7 (*)    Platelets 423 (*)    Neutro Abs 16.6 (*)    Monocytes Absolute 1.5 (*)    Abs Immature Granulocytes 0.36 (*)    All other components within normal limits  PROTIME-INR - Abnormal; Notable for the following components:   Prothrombin Time 16.4 (*)    INR 1.3 (*)    All other components within normal limits  I-STAT CHEM 8, ED - Abnormal; Notable for the following components:   BUN 33 (*)    Creatinine, Ser 1.30 (*)    Glucose, Bld 219 (*)    TCO2 19 (*)    Hemoglobin 5.1 (*)    HCT 15.0 (*)    All other components within normal limits  POC OCCULT BLOOD, ED - Abnormal; Notable for the following components:   Fecal Occult Bld POSITIVE (*)    All other components within normal limits  CBG MONITORING, ED - Abnormal; Notable for the following components:   Glucose-Capillary 216 (*)    All other components within normal limits  TROPONIN I (HIGH SENSITIVITY) - Abnormal; Notable for the following components:   Troponin I (High Sensitivity) 25 (*)    All other components within normal limits  RESP PANEL BY RT-PCR (FLU A&B, COVID) ARPGX2  CULTURE, BLOOD (ROUTINE X 2)  CULTURE, BLOOD (ROUTINE X 2)  URINE CULTURE  APTT  TSH  BRAIN NATRIURETIC PEPTIDE  LACTIC ACID, PLASMA   URINALYSIS, ROUTINE W REFLEX MICROSCOPIC  HEMOGLOBIN AND HEMATOCRIT, BLOOD  TYPE AND SCREEN  PREPARE RBC (CROSSMATCH)  TROPONIN I (HIGH SENSITIVITY)    EKG EKG Interpretation  Date/Time:  Thursday November 09 2021 06:26:36 EST Ventricular Rate:  100 PR Interval:  161 QRS Duration: 122 QT Interval:  411 QTC Calculation: 531 R Axis:   77 Text Interpretation: Sinus tachycardia Right bundle branch block Anterior infarct, age indeterminate Since last tracing Rate faster Confirmed by Calvert Cantor [phone]) on 11/09/2021 7:05:57 AM  Radiology DG Chest Port 1 View  Result Date: 11/09/2021 CLINICAL DATA:  A 66 year old male presents for evaluation of questionable sepsis. EXAM: PORTABLE CHEST 1 VIEW COMPARISON:  October 31, 2021. FINDINGS: EKG leads project over the chest. Median sternotomy and signs of CABG as before. Cardiomediastinal contours and hilar structures are stable. Lungs without signs of lobar consolidation. Graded opacity in the LEFT chest. Limited portable radiograph omits a portion of the inferior chest on the LEFT. No asymmetry to visualized portions of the LEFT and RIGHT hemidiaphragm. On limited assessment there is no acute skeletal process. IMPRESSION: Question small LEFT effusion.  No lobar consolidation. Postoperative changes of median sternotomy and CABG as before. Electronically Signed   By: Zetta Bills M.D.   On: 11/09/2021 07:59    Procedures .Critical Care Performed by: Tacy Learn, PA-C Authorized by: Tacy Learn, PA-C   Critical care provider statement:    Critical care time (minutes):  30   Critical care was time spent personally by me on the following activities:  Development of treatment plan with patient or surrogate, discussions with consultants, evaluation of patient's response to treatment, examination of patient, ordering and review of laboratory studies, ordering and review of radiographic studies, ordering and performing treatments and  interventions, pulse oximetry, re-evaluation of patient's condition and review of old charts    Medications Ordered in ED Medications  ferrous [id number] C-folic acid (TRINSICON / FOLTRIN) capsule 1 capsule (has no administration in time range)  ascorbic acid (VITAMIN C) tablet 1,000 mg (has no administration in time range)  cholecalciferol (VITAMIN D3) tablet 1,000 Units (has no administration in time range)  omega-3 acid ethyl esters (LOVAZA) capsule 1 g (has no administration in time range)  acetaminophen (TYLENOL) tablet 650 mg (has no administration in time range)    Or  acetaminophen (TYLENOL) suppository 650 mg (has no administration in time range)  ondansetron (ZOFRAN) tablet 4 mg (has no administration in time range)    Or  ondansetron (ZOFRAN) injection 4 mg (has no administration in time range)  amiodarone (PACERONE) tablet 200 mg (has no administration in time range)  0.9 %  sodium chloride infusion (10 mL/hr Intravenous New Bag/Given 11/09/21 0737)    ED Course/ Medical Decision Making/ A&P Clinical Course as of 11/09/21 1008  Thu Nov 09, 2021  0722 Spoke with Dr. Cyndia Bent, TCTS, who is aware of the patient and will follow.  [CS]  [id number] Emergency release blood ordered for recurrent syncope and melena. He was found to have Hgb 5.1 on I-stat.  [CS]  0809 Spoke with Dr. Henrene Pastor, [facility], who is going off call this morning, he will ensure the day team is aware. Secure chat sent to them as well.  [CS]    Clinical Course User Index [CS] Truddie Hidden, MD                           Medical Decision Making Amount and/or Complexity of Data Reviewed Labs: ordered. Radiology: ordered. ECG/medicine tests: ordered.   This patient presents to the ED for concern of weakness and SHOB, this involves an extensive number of  treatment options, and is a complaint that carries with it a high risk of complications and morbidity.  The differential diagnosis includes but not  limited to PE, GI bleed, arrhythmia, PNA, pneumothorax    Co morbidities that complicate the patient evaluation  Recent cardiac cath and cabg x 4 on Plavix and ASA   Additional history obtained:  Additional history obtained from EMS record reviewed, patient with syncopal episode while transferring from bed to EMS unit on stair chair, sinus tach on monitor External records from outside source obtained and reviewed including DC summary from 2/3-15/23 admission with cath on 10/23/21 and CABG x 4 on 10/26/21. Echo on 10/21/21 with EF 40-45%.   Lab Tests:  I Ordered, and personally interpreted labs.  The pertinent results include:  istat chem with hgb 5.1. CBC with hgb 4.8 (14.7 on admission 10/21/21, 7.4 at dc on 11/01/21). WBC 20.3, no specific infectious source. CMP with Cr 1.51, gradual uptrend. BUN 38 likely from GI bleed Lactic acid 7.5 likely from GI bleed   Imaging Studies ordered:  I ordered imaging studies including CXR  I independently visualized and interpreted imaging which showed LLL haziness  I agree with the radiologist interpretation, ? LLL effusion   Cardiac Monitoring:  The patient was maintained on a cardiac monitor.  I personally viewed and interpreted the cardiac monitored which showed an underlying rhythm of: sinus tachycardia. Sinus brady with syncopal episode followed by sinus rhythm.    Medicines ordered and prescription drug management:  I ordered medication including emergency release blood  for GI bleed, IV fluids for hypotension/syncopal event  Reevaluation of the patient after these medicines showed that the patient improved I have reviewed the patients home medicines and have made adjustments as needed 5106mL NS given by EMS prior to arrival, additional 2Ls NS given in the ER with syncopal event  Critical Interventions:  IV fluids and emergency release blood for GI bleed with syncopal event (hypotensive, bradycardic)   Consultations Obtained:  I  requested consultation with the internal medicine service,  and discussed lab and imaging findings as well as pertinent plan - they recommend: admission. Dr. Karle Starch, ER attending has discussed case with  GI who will reach out to Dr. Benson Norway (note in care everywhere from 09/2021) to determine who will be managing this patient's care. Follow up, Dr. Collene Mares will be seeing this patient.  Dr. Karle Starch has also spoke with CT surgery to make aware of patient's planned admission.   Problem List / ED Course:  66 year old male with history of hypertension and dyslipidemia, recent cath on 10/23/21 followed by CABG x 4 on 10/26/21 during his 2/3-15/23 admission. Arrives in the ER pale, generally weak, hypothermic (95 oral temp, rectal requested) hypotensive and tachycardic. Labs/imaging initiated. Shortly after seeing patient, called to room, patient with syncopal episode, more hypotensive, bradycardic, briefly unresponsive. Patient's clothing had been removed, stool in pants is melenotic with odor of GI bleed in room. Stool hemoccult positive, emergency release blood ordered. Istat chem returns with Hgb 5.1 with BUN 33. CBG at time of syncopal event 216 On reassessment, patient is feeling and looking better, vitals improved and stable. Plan to admit to internal medicine service with GI to consult, TCTS aware and will follow.   Reevaluation:  After the interventions noted above, I reevaluated the patient and found that they have :improved   Social Determinants of Health:  No PCP   Dispostion:  After consideration of the diagnostic results and the patients response  to treatment, I feel that the patent would benefit from admission to the hospital for monitoring and further management.          Final Clinical Impression(s) / ED Diagnoses Final diagnoses:  Acute upper GI bleed  Syncope and collapse    Rx / DC Orders ED Discharge Orders     None         Tacy Learn, PA-C 11/09/21  1008    Truddie Hidden, MD 11/09/21 1524

## 2021-11-09 NOTE — H&P (View-Only) (Signed)
Reason for Consult: Melena, syncope, and anemia Referring Physician: Teaching Service  Jed Limerick HPI: This is a 66 year old male with a PMH of CAD s/p CABG on 10/26/2021, HTN, hyperlipidemia, and ischemic cardiomyopathy admitted for syncope and SOB.  The patient was recently discharged from the hospital on 11/01/2021 s/p 3 vessel CABG.  The hospitalization was uneventful.  His discharged HGB on 11/01/2021 was at 7.4 g/dL, but on this admission the HGB was 5.1 g/dL and it declined to 4.8 g/dL with IV hydration.  He was provided with transfusions and his HGB increased to 8.1 g/dL.  ON 2/20/20232 he was evaluated in the office for complaints of oozing of blood at the saphenous vein harvest site.  He reported that it was soaking through the bandage and he was recommended frequent dressing changes.  Suture was going to increase the risk for infection.  On admission he was noted to be cool, pale, and diaphoretic.  A witnessed syncopal episodes was noted.  During one of the syncopal episodes he was noted to have a large melenic stool with a concomitant drop in his BP.  The patient was placed on ASA and Plavix post CABG.  He does not have a history of any GI bleeding issues and his last colonoscopy was in 2016.  He was identified to have a benign serrated polyp.  Past Medical History:  Diagnosis Date   CAD (coronary artery disease)    a) diagnosed with acute anterior STEMI with totally occluded LAD s/p PTCA/DES x 2 to prox and mid LAD 02/08/12, staged PTCA/DES to Cx 02/12/12, diagonal dz for med rx, & chronically occ RCA with collaterals.    Colorectal polyps    Hemorrhoids    Hypertension    Mild HTN in past, but more recently borderline low blood pressures   Ischemic cardiomyopathy    Improved - EF 25-30% by cath 02/08/12 then 50-55% by echo 02/11/12    Past Surgical History:  Procedure Laterality Date   CORONARY ARTERY BYPASS GRAFT N/A 10/26/2021   Procedure: CORONARY ARTERY BYPASS GRAFTING (CABG) X FOUR  , USING LEFT INTERNAL MAMMARY ARTERY AND RIGHT LEG GREATER SAPHENOUS VEIN HARVESTED ENDOSCOPICALLY;  Surgeon: Dahlia Byes, MD;  Location: [place];  Service: Open Heart Surgery;  Laterality: N/A;   ENDOVEIN HARVEST OF GREATER SAPHENOUS VEIN Right 10/26/2021   Procedure: ENDOVEIN HARVEST OF GREATER SAPHENOUS VEIN;  Surgeon: Dahlia Byes, MD;  Location: [place];  Service: Open Heart Surgery;  Laterality: Right;   LEFT HEART CATH AND CORONARY ANGIOGRAPHY N/A 10/23/2021   Procedure: LEFT HEART CATH AND CORONARY ANGIOGRAPHY;  Surgeon: Lorretta Harp, MD;  Location: [place] [facility];  Service: Cardiovascular;  Laterality: N/A;   LEFT HEART CATHETERIZATION WITH CORONARY ANGIOGRAM N/A 02/08/2012   Procedure: LEFT HEART CATHETERIZATION WITH CORONARY ANGIOGRAM;  Surgeon: Burnell Blanks, MD;  Location: [facility] CATH LAB;  Service: Cardiovascular;  Laterality: N/A;   PERCUTANEOUS CORONARY STENT INTERVENTION (PCI-S) N/A 02/08/2012   Procedure: PERCUTANEOUS CORONARY STENT INTERVENTION (PCI-S);  Surgeon: Burnell Blanks, MD;  Location: [facility] CATH LAB;  Service: Cardiovascular;  Laterality: N/A;   PERCUTANEOUS CORONARY STENT INTERVENTION (PCI-S) N/A 02/12/2012   Procedure: PERCUTANEOUS CORONARY STENT INTERVENTION (PCI-S);  Surgeon: Sherren Mocha, MD;  Location: [facility] CATH LAB;  Service: Cardiovascular;  Laterality: N/A;   TEE WITHOUT CARDIOVERSION N/A 10/26/2021   Procedure: TRANSESOPHAGEAL ECHOCARDIOGRAM (TEE);  Surgeon: Dahlia Byes, MD;  Location: [place];  Service: Open Heart Surgery;  Laterality: N/A;  Family History  Problem Relation Age of Onset   Heart attack Brother 2   Heart disease Father 72   Diabetes type II Sister    Diabetes type II Brother     Social History:  reports that he has never smoked. He does not have any smokeless tobacco history on file. He reports that he does not drink alcohol and does not use drugs.  Allergies:  Allergies  Allergen Reactions   Peanuts [Peanut Oil]  Swelling    cashews    Medications: Scheduled:  amiodarone  200 mg Oral Daily   [START ON 11/10/2021] vitamin C  1,000 mg Oral Daily   atorvastatin  80 mg Oral QHS   [START ON 11/10/2021] cholecalciferol  1,000 Units Oral Daily   ferrous [id number] C-folic acid  1 capsule Oral BID PC   insulin aspart  0-15 Units Subcutaneous TID WC   [START ON 11/10/2021] omega-3 acid ethyl esters  1 g Oral Daily   Continuous:  lactated ringers 100 mL/hr at 11/09/21 1521    Results for orders placed or performed during the hospital encounter of 11/09/21 (from the past 24 hour(s))  Resp Panel by RT-PCR (Flu A&B, Covid) Nasopharyngeal Swab     Status: None   Collection Time: 11/09/21  6:46 AM   Specimen: Nasopharyngeal Swab; Nasopharyngeal(NP) swabs in vial transport medium  Result Value Ref Range   SARS Coronavirus 2 by RT PCR NEGATIVE NEGATIVE   Influenza A by PCR NEGATIVE NEGATIVE   Influenza B by PCR NEGATIVE NEGATIVE  Brain natriuretic peptide     Status: None   Collection Time: 11/09/21  6:47 AM  Result Value Ref Range   B Natriuretic Peptide 75.7 0.0 - 100.0 pg/mL  Type and screen [place]     Status: None (Preliminary result)   Collection Time: 11/09/21  7:05 AM  Result Value Ref Range   ABO/RH(D) A POS    Antibody Screen NEG    Sample Expiration 11/12/2021,2359    Unit Number [id number]    Blood Component Type RED CELLS,LR    Unit division 00    Status of Unit ISSUED    Unit tag comment EMERGENCY RELEASE    Transfusion Status OK TO TRANSFUSE    Crossmatch Result COMPATIBLE    Unit Number [id number]    Blood Component Type RED CELLS,LR    Unit division 00    Status of Unit ISSUED    Unit tag comment EMERGENCY RELEASE    Transfusion Status OK TO TRANSFUSE    Crossmatch Result COMPATIBLE   CBG monitoring, ED     Status: Abnormal   Collection Time: 11/09/21  7:05 AM  Result Value Ref Range   Glucose-Capillary 216 (H) 70 - 99 mg/dL  POC occult  blood, ED     Status: Abnormal   Collection Time: 11/09/21  7:09 AM  Result Value Ref Range   Fecal Occult Bld POSITIVE (A) NEGATIVE  I-stat chem 8, ED     Status: Abnormal   Collection Time: 11/09/21  7:10 AM  Result Value Ref Range   Sodium 138 135 - 145 mmol/L   Potassium 3.8 3.5 - 5.1 mmol/L   Chloride 106 98 - 111 mmol/L   BUN 33 (H) 8 - 23 mg/dL   Creatinine, Ser 1.30 (H) 0.61 - 1.24 mg/dL   Glucose, Bld 219 (H) 70 - 99 mg/dL   Calcium, Ion 1.15 1.15 - 1.40 mmol/L   TCO2 19 (L) 22 - 32  mmol/L   Hemoglobin 5.1 (LL) 13.0 - 17.0 g/dL   HCT 15.0 (L) 39.0 - 52.0 %   Comment NOTIFIED PHYSICIAN   Prepare RBC (crossmatch)     Status: None   Collection Time: 11/09/21  7:10 AM  Result Value Ref Range   Order Confirmation      ORDER PROCESSED BY BLOOD BANK Performed at [facility], [place] [address], [place], [place] [address]   Comprehensive metabolic panel     Status: Abnormal   Collection Time: 11/09/21  7:19 AM  Result Value Ref Range   Sodium 136 135 - 145 mmol/L   Potassium 3.9 3.5 - 5.1 mmol/L   Chloride 105 98 - 111 mmol/L   CO2 18 (L) 22 - 32 mmol/L   Glucose, Bld 215 (H) 70 - 99 mg/dL   BUN 38 (H) 8 - 23 mg/dL   Creatinine, Ser 1.51 (H) 0.61 - 1.24 mg/dL   Calcium 8.2 (L) 8.9 - 10.3 mg/dL   Total Protein 4.9 (L) 6.5 - 8.1 g/dL   Albumin 2.4 (L) 3.5 - 5.0 g/dL   AST 28 15 - 41 U/L   ALT 18 0 - 44 U/L   Alkaline Phosphatase 44 38 - 126 U/L   Total Bilirubin 0.6 0.3 - 1.2 mg/dL   GFR, Estimated 50 (L) >60 mL/min   Anion gap 13 5 - 15  CBC WITH DIFFERENTIAL     Status: Abnormal   Collection Time: 11/09/21  7:19 AM  Result Value Ref Range   WBC 20.3 (H) 4.0 - 10.5 K/uL   RBC 1.47 (L) 4.22 - 5.81 MIL/uL   Hemoglobin 4.8 (LL) 13.0 - 17.0 g/dL   HCT 15.5 (L) 39.0 - 52.0 %   MCV 105.4 (H) 80.0 - 100.0 fL   MCH 32.7 26.0 - 34.0 pg   MCHC 31.0 30.0 - 36.0 g/dL   RDW 15.7 (H) 11.5 - 15.5 %   Platelets 423 (H) 150 - 400 K/uL   nRBC 0.1 0.0 - 0.2 %   Neutrophils  Relative % 82 %   Neutro Abs 16.6 (H) 1.7 - 7.7 K/uL   Lymphocytes Relative 9 %   Lymphs Abs 1.8 0.7 - 4.0 K/uL   Monocytes Relative 7 %   Monocytes Absolute 1.5 (H) 0.1 - 1.0 K/uL   Eosinophils Relative 0 %   Eosinophils Absolute 0.0 0.0 - 0.5 K/uL   Basophils Relative 0 %   Basophils Absolute 0.0 0.0 - 0.1 K/uL   Immature Granulocytes 2 %   Abs Immature Granulocytes 0.36 (H) 0.00 - 0.07 K/uL  Protime-INR     Status: Abnormal   Collection Time: 11/09/21  7:19 AM  Result Value Ref Range   Prothrombin Time 16.4 (H) 11.4 - 15.2 seconds   INR 1.3 (H) 0.8 - 1.2  APTT     Status: None   Collection Time: 11/09/21  7:19 AM  Result Value Ref Range   aPTT 27 24 - 36 seconds  Troponin I (High Sensitivity)     Status: Abnormal   Collection Time: 11/09/21  7:19 AM  Result Value Ref Range   Troponin I (High Sensitivity) 25 (H) <18 ng/L  TSH     Status: None   Collection Time: 11/09/21  7:20 AM  Result Value Ref Range   TSH 1.155 0.350 - 4.500 uIU/mL  Lactic acid, plasma     Status: Abnormal   Collection Time: 11/09/21  7:22 AM  Result Value Ref Range  Lactic Acid, Venous 7.5 (HH) 0.5 - 1.9 mmol/L  Lactic acid, plasma     Status: Abnormal   Collection Time: 11/09/21 11:23 AM  Result Value Ref Range   Lactic Acid, Venous 2.2 (HH) 0.5 - 1.9 mmol/L  Troponin I (High Sensitivity)     Status: Abnormal   Collection Time: 11/09/21 11:23 AM  Result Value Ref Range   Troponin I (High Sensitivity) 31 (H) <18 ng/L  Hemoglobin and hematocrit, blood     Status: Abnormal   Collection Time: 11/09/21 11:23 AM  Result Value Ref Range   Hemoglobin 8.1 (L) 13.0 - 17.0 g/dL   HCT 23.9 (L) 39.0 - 52.0 %  Urinalysis, Routine w reflex microscopic     Status: None   Collection Time: 11/09/21 11:42 AM  Result Value Ref Range   Color, Urine YELLOW YELLOW   APPearance CLEAR CLEAR   Specific Gravity, Urine 1.023 1.005 - 1.030   pH 5.0 5.0 - 8.0   Glucose, UA NEGATIVE NEGATIVE mg/dL   Hgb urine dipstick  NEGATIVE NEGATIVE   Bilirubin Urine NEGATIVE NEGATIVE   Ketones, ur NEGATIVE NEGATIVE mg/dL   Protein, ur NEGATIVE NEGATIVE mg/dL   Nitrite NEGATIVE NEGATIVE   Leukocytes,Ua NEGATIVE NEGATIVE     DG Chest Port 1 View  Result Date: 11/09/2021 CLINICAL DATA:  A 66 year old male presents for evaluation of questionable sepsis. EXAM: PORTABLE CHEST 1 VIEW COMPARISON:  October 31, 2021. FINDINGS: EKG leads project over the chest. Median sternotomy and signs of CABG as before. Cardiomediastinal contours and hilar structures are stable. Lungs without signs of lobar consolidation. Graded opacity in the LEFT chest. Limited portable radiograph omits a portion of the inferior chest on the LEFT. No asymmetry to visualized portions of the LEFT and RIGHT hemidiaphragm. On limited assessment there is no acute skeletal process. IMPRESSION: Question small LEFT effusion.  No lobar consolidation. Postoperative changes of median sternotomy and CABG as before. Electronically Signed   By: Zetta Bills M.D.   On: 11/09/2021 07:59    ROS:  As stated above in the HPI otherwise negative.  Blood pressure 120/67, pulse 94, temperature 98.3 F (36.8 C), temperature source Oral, resp. rate 18, SpO2 99 %.    PE: Gen: NAD, Alert and Oriented HEENT:  [place]/AT, EOMI Neck: Supple, no LAD Lungs: CTA Bilaterally CV: RRR without M/G/R ABD: Soft, NTND, +BS Ext: No C/C/E  Assessment/Plan: 1) Presumed upper GI bleed. 2) Symptomatic anemia. 3) Melena. 4) S/p CABG.   The current presentation suggests an UGI source of bleeding.  Further evaluation with an EGD will be performed tomorrow.  He remains hemodynamically stable.  If instability ensues then an emergent EGD will need to be performed.  Plan: 1) EGD tomorrow. 2) NPO. 3) PPI.  Malina Geers D 11/09/2021, 3:34 PM

## 2021-11-09 NOTE — Consult Note (Signed)
Reason for Consult: Melena, syncope, and anemia Referring Physician: Teaching Service  Jorge Fisher HPI: This is a 66 year old male with a PMH of CAD s/p CABG on 10/26/2021, HTN, hyperlipidemia, and ischemic cardiomyopathy admitted for syncope and SOB.  The patient was recently discharged from the hospital on 11/01/2021 s/p 3 vessel CABG.  The hospitalization was uneventful.  His discharged HGB on 11/01/2021 was at 7.4 g/dL, but on this admission the HGB was 5.1 g/dL and it declined to 4.8 g/dL with IV hydration.  He was provided with transfusions and his HGB increased to 8.1 g/dL.  ON 2/20/20232 he was evaluated in the office for complaints of oozing of blood at the saphenous vein harvest site.  He reported that it was soaking through the bandage and he was recommended frequent dressing changes.  Suture was going to increase the risk for infection.  On admission he was noted to be cool, pale, and diaphoretic.  A witnessed syncopal episodes was noted.  During one of the syncopal episodes he was noted to have a large melenic stool with a concomitant drop in his BP.  The patient was placed on ASA and Plavix post CABG.  He does not have a history of any GI bleeding issues and his last colonoscopy was in 2016.  He was identified to have a benign serrated polyp.  Past Medical History:  Diagnosis Date   CAD (coronary artery disease)    a) diagnosed with acute anterior STEMI with totally occluded LAD s/p PTCA/DES x 2 to prox and mid LAD 02/08/12, staged PTCA/DES to Cx 02/12/12, diagonal dz for med rx, & chronically occ RCA with collaterals.    Colorectal polyps    Hemorrhoids    Hypertension    Mild HTN in past, but more recently borderline low blood pressures   Ischemic cardiomyopathy    Improved - EF 25-30% by cath 02/08/12 then 50-55% by echo 02/11/12    Past Surgical History:  Procedure Laterality Date   CORONARY ARTERY BYPASS GRAFT N/A 10/26/2021   Procedure: CORONARY ARTERY BYPASS GRAFTING (CABG) X FOUR  , USING LEFT INTERNAL MAMMARY ARTERY AND RIGHT LEG GREATER SAPHENOUS VEIN HARVESTED ENDOSCOPICALLY;  Surgeon: Dahlia Byes, MD;  Location: [place];  Service: Open Heart Surgery;  Laterality: N/A;   ENDOVEIN HARVEST OF GREATER SAPHENOUS VEIN Right 10/26/2021   Procedure: ENDOVEIN HARVEST OF GREATER SAPHENOUS VEIN;  Surgeon: Dahlia Byes, MD;  Location: [place];  Service: Open Heart Surgery;  Laterality: Right;   LEFT HEART CATH AND CORONARY ANGIOGRAPHY N/A 10/23/2021   Procedure: LEFT HEART CATH AND CORONARY ANGIOGRAPHY;  Surgeon: Lorretta Harp, MD;  Location: [place] [facility];  Service: Cardiovascular;  Laterality: N/A;   LEFT HEART CATHETERIZATION WITH CORONARY ANGIOGRAM N/A 02/08/2012   Procedure: LEFT HEART CATHETERIZATION WITH CORONARY ANGIOGRAM;  Surgeon: Burnell Blanks, MD;  Location: [facility] CATH LAB;  Service: Cardiovascular;  Laterality: N/A;   PERCUTANEOUS CORONARY STENT INTERVENTION (PCI-S) N/A 02/08/2012   Procedure: PERCUTANEOUS CORONARY STENT INTERVENTION (PCI-S);  Surgeon: Burnell Blanks, MD;  Location: [facility] CATH LAB;  Service: Cardiovascular;  Laterality: N/A;   PERCUTANEOUS CORONARY STENT INTERVENTION (PCI-S) N/A 02/12/2012   Procedure: PERCUTANEOUS CORONARY STENT INTERVENTION (PCI-S);  Surgeon: Sherren Mocha, MD;  Location: [facility] CATH LAB;  Service: Cardiovascular;  Laterality: N/A;   TEE WITHOUT CARDIOVERSION N/A 10/26/2021   Procedure: TRANSESOPHAGEAL ECHOCARDIOGRAM (TEE);  Surgeon: Dahlia Byes, MD;  Location: [place];  Service: Open Heart Surgery;  Laterality: N/A;  Family History  Problem Relation Age of Onset   Heart attack Brother 45   Heart disease Father 74   Diabetes type II Sister    Diabetes type II Brother     Social History:  reports that he has never smoked. He does not have any smokeless tobacco history on file. He reports that he does not drink alcohol and does not use drugs.  Allergies:  Allergies  Allergen Reactions   Peanuts [Peanut Oil]  Swelling    cashews    Medications: Scheduled:  amiodarone  200 mg Oral Daily   [START ON 11/10/2021] vitamin C  1,000 mg Oral Daily   atorvastatin  80 mg Oral QHS   [START ON 11/10/2021] cholecalciferol  1,000 Units Oral Daily   ferrous [id number] C-folic acid  1 capsule Oral BID PC   insulin aspart  0-15 Units Subcutaneous TID WC   [START ON 11/10/2021] omega-3 acid ethyl esters  1 g Oral Daily   Continuous:  lactated ringers 100 mL/hr at 11/09/21 1521    Results for orders placed or performed during the hospital encounter of 11/09/21 (from the past 24 hour(s))  Resp Panel by RT-PCR (Flu A&B, Covid) Nasopharyngeal Swab     Status: None   Collection Time: 11/09/21  6:46 AM   Specimen: Nasopharyngeal Swab; Nasopharyngeal(NP) swabs in vial transport medium  Result Value Ref Range   SARS Coronavirus 2 by RT PCR NEGATIVE NEGATIVE   Influenza A by PCR NEGATIVE NEGATIVE   Influenza B by PCR NEGATIVE NEGATIVE  Brain natriuretic peptide     Status: None   Collection Time: 11/09/21  6:47 AM  Result Value Ref Range   B Natriuretic Peptide 75.7 0.0 - 100.0 pg/mL  Type and screen [place]     Status: None (Preliminary result)   Collection Time: 11/09/21  7:05 AM  Result Value Ref Range   ABO/RH(D) A POS    Antibody Screen NEG    Sample Expiration 11/12/2021,2359    Unit Number [id number]    Blood Component Type RED CELLS,LR    Unit division 00    Status of Unit ISSUED    Unit tag comment EMERGENCY RELEASE    Transfusion Status OK TO TRANSFUSE    Crossmatch Result COMPATIBLE    Unit Number [id number]    Blood Component Type RED CELLS,LR    Unit division 00    Status of Unit ISSUED    Unit tag comment EMERGENCY RELEASE    Transfusion Status OK TO TRANSFUSE    Crossmatch Result COMPATIBLE   CBG monitoring, ED     Status: Abnormal   Collection Time: 11/09/21  7:05 AM  Result Value Ref Range   Glucose-Capillary 216 (H) 70 - 99 mg/dL  POC occult  blood, ED     Status: Abnormal   Collection Time: 11/09/21  7:09 AM  Result Value Ref Range   Fecal Occult Bld POSITIVE (A) NEGATIVE  I-stat chem 8, ED     Status: Abnormal   Collection Time: 11/09/21  7:10 AM  Result Value Ref Range   Sodium 138 135 - 145 mmol/L   Potassium 3.8 3.5 - 5.1 mmol/L   Chloride 106 98 - 111 mmol/L   BUN 33 (H) 8 - 23 mg/dL   Creatinine, Ser 1.30 (H) 0.61 - 1.24 mg/dL   Glucose, Bld 219 (H) 70 - 99 mg/dL   Calcium, Ion 1.15 1.15 - 1.40 mmol/L   TCO2 19 (L) 22 - 32  mmol/L   Hemoglobin 5.1 (LL) 13.0 - 17.0 g/dL   HCT 15.0 (L) 39.0 - 52.0 %   Comment NOTIFIED PHYSICIAN   Prepare RBC (crossmatch)     Status: None   Collection Time: 11/09/21  7:10 AM  Result Value Ref Range   Order Confirmation      ORDER PROCESSED BY BLOOD BANK Performed at [facility], [place] [address] [place], [place] [address]   Comprehensive metabolic panel     Status: Abnormal   Collection Time: 11/09/21  7:19 AM  Result Value Ref Range   Sodium 136 135 - 145 mmol/L   Potassium 3.9 3.5 - 5.1 mmol/L   Chloride 105 98 - 111 mmol/L   CO2 18 (L) 22 - 32 mmol/L   Glucose, Bld 215 (H) 70 - 99 mg/dL   BUN 38 (H) 8 - 23 mg/dL   Creatinine, Ser 1.51 (H) 0.61 - 1.24 mg/dL   Calcium 8.2 (L) 8.9 - 10.3 mg/dL   Total Protein 4.9 (L) 6.5 - 8.1 g/dL   Albumin 2.4 (L) 3.5 - 5.0 g/dL   AST 28 15 - 41 U/L   ALT 18 0 - 44 U/L   Alkaline Phosphatase 44 38 - 126 U/L   Total Bilirubin 0.6 0.3 - 1.2 mg/dL   GFR, Estimated 50 (L) >60 mL/min   Anion gap 13 5 - 15  CBC WITH DIFFERENTIAL     Status: Abnormal   Collection Time: 11/09/21  7:19 AM  Result Value Ref Range   WBC 20.3 (H) 4.0 - 10.5 K/uL   RBC 1.47 (L) 4.22 - 5.81 MIL/uL   Hemoglobin 4.8 (LL) 13.0 - 17.0 g/dL   HCT 15.5 (L) 39.0 - 52.0 %   MCV 105.4 (H) 80.0 - 100.0 fL   MCH 32.7 26.0 - 34.0 pg   MCHC 31.0 30.0 - 36.0 g/dL   RDW 15.7 (H) 11.5 - 15.5 %   Platelets 423 (H) 150 - 400 K/uL   nRBC 0.1 0.0 - 0.2 %   Neutrophils  Relative % 82 %   Neutro Abs 16.6 (H) 1.7 - 7.7 K/uL   Lymphocytes Relative 9 %   Lymphs Abs 1.8 0.7 - 4.0 K/uL   Monocytes Relative 7 %   Monocytes Absolute 1.5 (H) 0.1 - 1.0 K/uL   Eosinophils Relative 0 %   Eosinophils Absolute 0.0 0.0 - 0.5 K/uL   Basophils Relative 0 %   Basophils Absolute 0.0 0.0 - 0.1 K/uL   Immature Granulocytes 2 %   Abs Immature Granulocytes 0.36 (H) 0.00 - 0.07 K/uL  Protime-INR     Status: Abnormal   Collection Time: 11/09/21  7:19 AM  Result Value Ref Range   Prothrombin Time 16.4 (H) 11.4 - 15.2 seconds   INR 1.3 (H) 0.8 - 1.2  APTT     Status: None   Collection Time: 11/09/21  7:19 AM  Result Value Ref Range   aPTT 27 24 - 36 seconds  Troponin I (High Sensitivity)     Status: Abnormal   Collection Time: 11/09/21  7:19 AM  Result Value Ref Range   Troponin I (High Sensitivity) 25 (H) <18 ng/L  TSH     Status: None   Collection Time: 11/09/21  7:20 AM  Result Value Ref Range   TSH 1.155 0.350 - 4.500 uIU/mL  Lactic acid, plasma     Status: Abnormal   Collection Time: 11/09/21  7:22 AM  Result Value Ref Range  Lactic Acid, Venous 7.5 (HH) 0.5 - 1.9 mmol/L  Lactic acid, plasma     Status: Abnormal   Collection Time: 11/09/21 11:23 AM  Result Value Ref Range   Lactic Acid, Venous 2.2 (HH) 0.5 - 1.9 mmol/L  Troponin I (High Sensitivity)     Status: Abnormal   Collection Time: 11/09/21 11:23 AM  Result Value Ref Range   Troponin I (High Sensitivity) 31 (H) <18 ng/L  Hemoglobin and hematocrit, blood     Status: Abnormal   Collection Time: 11/09/21 11:23 AM  Result Value Ref Range   Hemoglobin 8.1 (L) 13.0 - 17.0 g/dL   HCT 23.9 (L) 39.0 - 52.0 %  Urinalysis, Routine w reflex microscopic     Status: None   Collection Time: 11/09/21 11:42 AM  Result Value Ref Range   Color, Urine YELLOW YELLOW   APPearance CLEAR CLEAR   Specific Gravity, Urine 1.023 1.005 - 1.030   pH 5.0 5.0 - 8.0   Glucose, UA NEGATIVE NEGATIVE mg/dL   Hgb urine dipstick  NEGATIVE NEGATIVE   Bilirubin Urine NEGATIVE NEGATIVE   Ketones, ur NEGATIVE NEGATIVE mg/dL   Protein, ur NEGATIVE NEGATIVE mg/dL   Nitrite NEGATIVE NEGATIVE   Leukocytes,Ua NEGATIVE NEGATIVE     DG Chest Port 1 View  Result Date: 11/09/2021 CLINICAL DATA:  A 66 year old male presents for evaluation of questionable sepsis. EXAM: PORTABLE CHEST 1 VIEW COMPARISON:  October 31, 2021. FINDINGS: EKG leads project over the chest. Median sternotomy and signs of CABG as before. Cardiomediastinal contours and hilar structures are stable. Lungs without signs of lobar consolidation. Graded opacity in the LEFT chest. Limited portable radiograph omits a portion of the inferior chest on the LEFT. No asymmetry to visualized portions of the LEFT and RIGHT hemidiaphragm. On limited assessment there is no acute skeletal process. IMPRESSION: Question small LEFT effusion.  No lobar consolidation. Postoperative changes of median sternotomy and CABG as before. Electronically Signed   By: Zetta Bills M.D.   On: 11/09/2021 07:59    ROS:  As stated above in the HPI otherwise negative.  Blood pressure 120/67, pulse 94, temperature 98.3 F (36.8 C), temperature source Oral, resp. rate 18, SpO2 99 %.    PE: Gen: NAD, Alert and Oriented HEENT:  [place]/AT, EOMI Neck: Supple, no LAD Lungs: CTA Bilaterally CV: RRR without M/G/R ABD: Soft, NTND, +BS Ext: No C/C/E  Assessment/Plan: 1) Presumed upper GI bleed. 2) Symptomatic anemia. 3) Melena. 4) S/p CABG.   The current presentation suggests an UGI source of bleeding.  Further evaluation with an EGD will be performed tomorrow.  He remains hemodynamically stable.  If instability ensues then an emergent EGD will need to be performed.  Plan: 1) EGD tomorrow. 2) NPO. 3) PPI.  Berlie Persky D 11/09/2021, 3:34 PM

## 2021-11-10 ENCOUNTER — Encounter (HOSPITAL_COMMUNITY): Payer: Self-pay | Admitting: Internal Medicine

## 2021-11-10 ENCOUNTER — Inpatient Hospital Stay (HOSPITAL_COMMUNITY): Payer: 59 | Admitting: Registered Nurse

## 2021-11-10 ENCOUNTER — Encounter (HOSPITAL_COMMUNITY)
Admission: EM | Disposition: A | Discharge status: Home / Self Care | Payer: Self-pay | Source: Home / Self Care | Attending: Internal Medicine

## 2021-11-10 ENCOUNTER — Inpatient Hospital Stay (HOSPITAL_COMMUNITY): Payer: 59

## 2021-11-10 DIAGNOSIS — D62 Acute posthemorrhagic anemia: Secondary | ICD-10-CM

## 2021-11-10 DIAGNOSIS — K264 Chronic or unspecified duodenal ulcer with hemorrhage: Secondary | ICD-10-CM

## 2021-11-10 DIAGNOSIS — K922 Gastrointestinal hemorrhage, unspecified: Secondary | ICD-10-CM

## 2021-11-10 DIAGNOSIS — K2971 Gastritis, unspecified, with bleeding: Secondary | ICD-10-CM

## 2021-11-10 DIAGNOSIS — I2510 Atherosclerotic heart disease of native coronary artery without angina pectoris: Secondary | ICD-10-CM

## 2021-11-10 HISTORY — PX: BIOPSY: SHX5522

## 2021-11-10 HISTORY — PX: ESOPHAGOGASTRODUODENOSCOPY (EGD) WITH PROPOFOL: SHX5813

## 2021-11-10 LAB — BLOOD CULTURE ID PANEL (REFLEXED) - BCID2

## 2021-11-10 LAB — CBC WITH DIFFERENTIAL/PLATELET
Abs Immature Granulocytes: 0.21 10*3/uL — ABNORMAL HIGH (ref 0.00–0.07)
Basophils Absolute: 0 10*3/uL (ref 0.0–0.1)
Basophils Relative: 0 %
Eosinophils Absolute: 0 10*3/uL (ref 0.0–0.5)
Eosinophils Relative: 0 %
HCT: 18.3 % — ABNORMAL LOW (ref 39.0–52.0)
Hemoglobin: 5.9 g/dL — CL (ref 13.0–17.0)
Immature Granulocytes: 1 %
Lymphocytes Relative: 12 %
Lymphs Abs: 2.3 10*3/uL (ref 0.7–4.0)
MCH: 29.9 pg (ref 26.0–34.0)
MCHC: 32.2 g/dL (ref 30.0–36.0)
MCV: 92.9 fL (ref 80.0–100.0)
Monocytes Absolute: 1.9 10*3/uL — ABNORMAL HIGH (ref 0.1–1.0)
Monocytes Relative: 10 %
Neutro Abs: 14.9 10*3/uL — ABNORMAL HIGH (ref 1.7–7.7)
Neutrophils Relative %: 77 %
Platelets: 250 10*3/uL (ref 150–400)
RBC: 1.97 MIL/uL — ABNORMAL LOW (ref 4.22–5.81)
RDW: 20.3 % — ABNORMAL HIGH (ref 11.5–15.5)
WBC: 19.4 10*3/uL — ABNORMAL HIGH (ref 4.0–10.5)
nRBC: 0.3 % — ABNORMAL HIGH (ref 0.0–0.2)

## 2021-11-10 LAB — URINE CULTURE: Culture: 1000 — AB

## 2021-11-10 LAB — GLUCOSE, CAPILLARY
Glucose-Capillary: 107 mg/dL — ABNORMAL HIGH (ref 70–99)
Glucose-Capillary: 95 mg/dL (ref 70–99)
Glucose-Capillary: 78 mg/dL (ref 70–99)

## 2021-11-10 LAB — BASIC METABOLIC PANEL
Anion gap: 6 (ref 5–15)
BUN: 33 mg/dL — ABNORMAL HIGH (ref 8–23)
CO2: 22 mmol/L (ref 22–32)
Calcium: 7.9 mg/dL — ABNORMAL LOW (ref 8.9–10.3)
Chloride: 111 mmol/L (ref 98–111)
Creatinine, Ser: 1.24 mg/dL (ref 0.61–1.24)
GFR, Estimated: >60 mL/min (ref >60)
Glucose, Bld: 104 mg/dL — ABNORMAL HIGH (ref 70–99)
Potassium: 4 mmol/L (ref 3.5–5.1)
Sodium: 139 mmol/L (ref 135–145)

## 2021-11-10 LAB — CBC
HCT: 24.7 % — ABNORMAL LOW (ref 39.0–52.0)
Hemoglobin: 7.9 g/dL — ABNORMAL LOW (ref 13.0–17.0)
MCH: 30.2 pg (ref 26.0–34.0)
MCHC: 32 g/dL (ref 30.0–36.0)
MCV: 94.3 fL (ref 80.0–100.0)
Platelets: 229 10*3/uL (ref 150–400)
RBC: 2.62 MIL/uL — ABNORMAL LOW (ref 4.22–5.81)
RDW: 21.3 % — ABNORMAL HIGH (ref 11.5–15.5)
WBC: 22 10*3/uL — ABNORMAL HIGH (ref 4.0–10.5)
nRBC: 0.6 % — ABNORMAL HIGH (ref 0.0–0.2)

## 2021-11-10 LAB — TECHNOLOGIST SMEAR REVIEW

## 2021-11-10 LAB — PREPARE RBC (CROSSMATCH)

## 2021-11-10 LAB — HEMOGLOBIN AND HEMATOCRIT, BLOOD
HCT: 20.4 % — ABNORMAL LOW (ref 39.0–52.0)
Hemoglobin: 7 g/dL — ABNORMAL LOW (ref 13.0–17.0)

## 2021-11-10 SURGERY — ESOPHAGOGASTRODUODENOSCOPY (EGD) WITH PROPOFOL
Anesthesia: Monitor Anesthesia Care

## 2021-11-10 MED ORDER — PHENYLEPHRINE 40 MCG/ML (10ML) SYRINGE FOR IV PUSH (FOR BLOOD PRESSURE SUPPORT)
PREFILLED_SYRINGE | INTRAVENOUS | Status: DC | PRN
Start: 2021-11-10 — End: 2021-11-10
  Administered 2021-11-10 (×2): 80 ug via INTRAVENOUS
  Administered 2021-11-10: 120 ug via INTRAVENOUS
  Administered 2021-11-10: 80 ug via INTRAVENOUS

## 2021-11-10 MED ORDER — PROPOFOL 500 MG/50ML IV EMUL
INTRAVENOUS | Status: DC | PRN
  Administered 2021-11-10: 75 ug/kg/min via INTRAVENOUS

## 2021-11-10 MED ORDER — SODIUM CHLORIDE 0.9 % IV SOLN: INTRAVENOUS | Status: DC

## 2021-11-10 MED ORDER — SODIUM CHLORIDE 0.9% IV SOLUTION: Freq: Once | INTRAVENOUS | Status: AC

## 2021-11-10 MED ORDER — PROPOFOL 10 MG/ML IV BOLUS
INTRAVENOUS | Status: DC | PRN
  Administered 2021-11-10: 40 mg via INTRAVENOUS

## 2021-11-10 MED ORDER — PHENYLEPHRINE HCL-NACL 20-0.9 MG/250ML-% IV SOLN
INTRAVENOUS | Status: DC | PRN
  Administered 2021-11-10: 20 ug/min via INTRAVENOUS

## 2021-11-10 SURGICAL SUPPLY — 15 items

## 2021-11-10 NOTE — Anesthesia Preprocedure Evaluation (Addendum)
Anesthesia Evaluation  Patient identified by MRN, date of birth, ID band Patient awake    Reviewed: Allergy & Precautions, NPO status , Patient's Chart, lab work & pertinent test results, reviewed documented beta blocker date and time   Airway Mallampati: I       Dental no notable dental hx.    Pulmonary neg pulmonary ROS,    Pulmonary exam normal        Cardiovascular hypertension, Pt. on home beta blockers + CAD, + Past MI and + CABG  Normal cardiovascular exam     Neuro/Psych negative neurological ROS  negative psych ROS   GI/Hepatic Neg liver ROS,   Endo/Other    Renal/GU Renal InsufficiencyRenal disease     Musculoskeletal negative musculoskeletal ROS (+)   Abdominal Normal abdominal exam  (+)   Peds  Hematology  (+) Blood dyscrasia, anemia ,   Anesthesia Other Findings   Prox RCA to Dist RCA lesion is 95% stenosed.   RPAV lesion is 100% stenosed.   RPDA lesion is 95% stenosed.   Ost LAD to Mid LAD lesion is 95% stenosed.   Ost LM lesion is 80% stenosed.   1st Diag lesion is 90% stenosed.   Mid Cx to Dist Cx lesion is 90% stenosed.   Ost Cx to Mid Cx lesion is 90% stenosed.   DMARI Fisher is a 66 y.o. male    [id number] LOCATION:  FACILITY: [facility]  PHYSICIAN: Quay Burow, M.D. 01/03/1956   DATE OF PROCEDURE:  10/23/2021  DATE OF DISCHARGE:    CARDIAC CATHETERIZATION     History obtained from chart review.66 y.o.malewith PMH Ischemic cardiomyopathy, h/o anterolateral STEMI 2013 s/p PCI LAD, staged PCI to LCX, CTO RCA (L->R collaterals), HTN, HLDwho is being seen2/4/2023for the evaluation of chest pain/NSTEMI.  His ejection fraction was in the 40 to 45% range.  Troponins peaked at 7000.   IMPRESSION: Mr. Jorge Fisher has left main/three-vessel disease and moderate LV dysfunction.  He will need CABG for complete revascularization.  The radial sheath was removed and a TR  band was placed on the right wrist to achieve patent hemostasis.  The patient left lab in stable condition.  Heparin will be restarted 4 hours after sheath removal without a bolus.  Quay Burow. MD, [facility] 10/23/2021 3:53 PM      Surgeon Notes    10/29/2021 9:43 AM Operative Note signed by Jorge Byes, MD   10/26/2021 4:01 PM Operative Note - Scan signed by Default, Provider, MD  Procedural Details  Technical Details PROCEDURE DESCRIPTION:   The patient was brought to the second floor [facility] cath lab in the postabsorptive state. He was not premedicated . His right wrist was prepped and shaved in usual sterile fashion. Xylocaine 1% was used for local anesthesia. A 6 French sheath was inserted into the right radial artery using standard Seldinger technique. The patient received 4000 units  of heparin intravenously.  5 French right left Judkins diagnostic catheters along with a 5 French pigtail catheter used for selective coronary angiography and obtain left heart pressures.  Isovue dye was used for the entirety of the case (50 cc total to contrast the patient).  Retrograde aortic, ventricular and pullback pressures were recorded.  Radial cocktail was administered via the SideArm sheath. Estimated blood loss <50 mL.   During this procedure no sedation was administered.  Medications (Filter: Administrations occurring from 1511 to 1547 on 10/23/21) important Continuous medications are totaled by the amount administered until 10/23/21 1547.  Heparin (Porcine) in NaCl 1000-0.9 UT/500ML-% SOLN (mL) Total volume:  1,000 mL Date/Time Rate/Dose/Volume Action  10/23/21 1514 500 mL Given  1514 500 mL Given   lidocaine (PF) (XYLOCAINE) 1 % injection (mL) Total volume:  2 mL Date/Time Rate/Dose/Volume Action  10/23/21 1523 2 mL Given   Radial Cocktail (Verapamil 5 mg, NTG, Lidocaine) (mL) Total volume:  15 mL Date/Time Rate/Dose/Volume Action  10/23/21 1525 15  mL Given   heparin sodium (porcine) injection (Units) Total dose:  4,000 Units Date/Time Rate/Dose/Volume Action  10/23/21 1528 4,000 Units Given   iohexol (OMNIPAQUE) 350 MG/ML injection (mL) Total volume:  50 mL Date/Time Rate/Dose/Volume Action  10/23/21 1540 50 mL Given   acetaminophen (TYLENOL) tablet 650 mg (mg) Total dose:  Cannot be calculated* Dosing weight:  90 *Administration dose not documented Date/Time Rate/Dose/Volume Action  10/23/21 1511 *Not included in total MAR Hold   aspirin EC tablet 81 mg (mg) Total dose:  Cannot be calculated* Dosing weight:  90 *Administration dose not documented Date/Time Rate/Dose/Volume Action  10/23/21 1511 *Not included in total MAR Hold   atorvastatin (LIPITOR) tablet 80 mg (mg) Total dose:  Cannot be calculated* Dosing weight:  90 *Administration dose not documented Date/Time Rate/Dose/Volume Action  10/23/21 1511 *Not included in total MAR Hold   metoprolol tartrate (LOPRESSOR) tablet 12.5 mg (mg) Total dose:  Cannot be calculated* Dosing weight:  78.1 *Administration dose not documented Date/Time Rate/Dose/Volume Action  10/23/21 1511 *Not included in total MAR Hold   nitroGLYCERIN (NITROSTAT) SL tablet 0.4 mg (mg) Total dose:  Cannot be calculated* Dosing weight:  90 *Administration dose not documented Date/Time Rate/Dose/Volume Action  10/23/21 1511 *Not included in total MAR Hold   ondansetron (ZOFRAN) injection 4 mg (mg) Total dose:  Cannot be calculated* Dosing weight:  90 *Administration dose not documented Date/Time Rate/Dose/Volume Action  10/23/21 1511 *Not included in total MAR Hold   sodium chloride flush (NS) 0.9 % injection 3 mL (mL) Total dose:  Cannot be calculated* Dosing weight:  78.1 *Administration dose not documented Date/Time Rate/Dose/Volume Action  10/23/21 1511 *Not included in total MAR Hold      1. Left ventricular ejection fraction, by estimation, is 40 to 45%. The  left ventricle  has mildly decreased function. The left ventricle  demonstrates regional wall motion abnormalities (see scoring  diagram/findings for description). There is mild left  ventricular hypertrophy. Left ventricular diastolic parameters are  indeterminate.  2. Right ventricular systolic function is normal. The right ventricular  size is normal. Tricuspid regurgitation signal is inadequate for assessing  PA pressure.  3. The mitral valve is grossly normal. Trivial mitral valve  regurgitation.  4. The aortic valve is tricuspid. Aortic valve regurgitation is not  visualized.  5. The inferior vena cava is dilated in size with >50% respiratory  variability, suggesting right atrial pressure of 8 mmHg.   CABG 2 weeks ago!  Reproductive/Obstetrics                                                            Anesthesia Evaluation  Patient identified by MRN, date of birth, ID band Patient awake    Reviewed: Allergy & Precautions, NPO status , Patient's Chart, lab work & pertinent test results  History of Anesthesia Complications Negative for: history of anesthetic complications  Airway Mallampati: II  TM Distance: >3 FB Neck ROM: Full    Dental  (+) Dental Advisory Given, Teeth Intact   Pulmonary neg pulmonary ROS,    breath sounds clear to auscultation       Cardiovascular hypertension, Pt. on medications and Pt. on home beta blockers + CAD and + Past MI   Rhythm:Regular  1. Left ventricular ejection fraction, by estimation, is 40 to 45%. The  left ventricle has mildly decreased function. The left ventricle  demonstrates regional wall motion abnormalities (see scoring  diagram/findings for description). There is mild left  ventricular hypertrophy. Left ventricular diastolic parameters are  indeterminate.  2. Right ventricular systolic function is normal. The right ventricular  size is normal. Tricuspid regurgitation signal is inadequate for  assessing  PA pressure.  3. The mitral valve is grossly normal. Trivial mitral valve  regurgitation.  4. The aortic valve is tricuspid. Aortic valve regurgitation is not  visualized.  5. The inferior vena cava is dilated in size with >50% respiratory  variability, suggesting right atrial pressure of 8 mmHg.      Prox RCA to Dist RCA lesion is 95% stenosed.   RPAV lesion is 100% stenosed.   RPDA lesion is 95% stenosed.   Ost LAD to Mid LAD lesion is 95% stenosed.   Ost LM lesion is 80% stenosed.   1st Diag lesion is 90% stenosed.   Mid Cx to Dist Cx lesion is 90% stenosed.   Ost Cx to Mid Cx lesion is 90% stenosed.    Neuro/Psych negative neurological ROS  negative psych ROS   GI/Hepatic negative GI ROS, Neg liver ROS,   Endo/Other  negative endocrine ROS  Renal/GU negative Renal ROS     Musculoskeletal negative musculoskeletal ROS (+)   Abdominal   Peds  Hematology negative hematology ROS (+) Lab Results      Component                Value               Date                      WBC                      5.7                 10/26/2021                HGB                      15.0                10/26/2021                HCT                      43.4                10/26/2021                MCV                      93.9                10/26/2021                PLT  191                 10/26/2021              Anesthesia Other Findings   Reproductive/Obstetrics                             Anesthesia Physical Anesthesia Plan  ASA: 4  Anesthesia Plan: General   Post-op Pain Management:    Induction: Intravenous  PONV Risk Score and Plan: 2 and Treatment may vary due to age or medical condition  Airway Management Planned: Oral ETT  Additional Equipment: Arterial line, CVP, PA Cath, TEE and Ultrasound Guidance Line Placement  Intra-op Plan:   Post-operative Plan: Post-operative  intubation/ventilation  Informed Consent: I have reviewed the patients History and Physical, chart, labs and discussed the procedure including the risks, benefits and alternatives for the proposed anesthesia with the patient or authorized representative who has indicated his/her understanding and acceptance.     Dental advisory given  Plan Discussed with: CRNA and Anesthesiologist  Anesthesia Plan Comments:         Anesthesia Quick Evaluation                                   Anesthesia Evaluation  Patient identified by MRN, date of birth, ID band Patient awake    Reviewed: Allergy & Precautions, NPO status , Patient's Chart, lab work & pertinent test results  History of Anesthesia Complications Negative for: history of anesthetic complications  Airway Mallampati: II  TM Distance: >3 FB Neck ROM: Full    Dental  (+) Dental Advisory Given, Teeth Intact   Pulmonary neg pulmonary ROS,    breath sounds clear to auscultation       Cardiovascular hypertension, Pt. on medications and Pt. on home beta blockers + CAD and + Past MI   Rhythm:Regular  1. Left ventricular ejection fraction, by estimation, is 40 to 45%. The  left ventricle has mildly decreased function. The left ventricle  demonstrates regional wall motion abnormalities (see scoring  diagram/findings for description). There is mild left  ventricular hypertrophy. Left ventricular diastolic parameters are  indeterminate.  2. Right ventricular systolic function is normal. The right ventricular  size is normal. Tricuspid regurgitation signal is inadequate for assessing  PA pressure.  3. The mitral valve is grossly normal. Trivial mitral valve  regurgitation.  4. The aortic valve is tricuspid. Aortic valve regurgitation is not  visualized.  5. The inferior vena cava is dilated in size with >50% respiratory  variability, suggesting right atrial pressure of 8 mmHg.      Prox RCA to Dist RCA  lesion is 95% stenosed.   RPAV lesion is 100% stenosed.   RPDA lesion is 95% stenosed.   Ost LAD to Mid LAD lesion is 95% stenosed.   Ost LM lesion is 80% stenosed.   1st Diag lesion is 90% stenosed.   Mid Cx to Dist Cx lesion is 90% stenosed.   Ost Cx to Mid Cx lesion is 90% stenosed.    Neuro/Psych negative neurological ROS  negative psych ROS   GI/Hepatic negative GI ROS, Neg liver ROS,   Endo/Other  negative endocrine ROS  Renal/GU negative Renal ROS     Musculoskeletal negative musculoskeletal ROS (+)   Abdominal   Peds  Hematology negative hematology ROS (+) Lab Results  Component                Value               Date                      WBC                      5.7                 10/26/2021                HGB                      15.0                10/26/2021                HCT                      43.4                10/26/2021                MCV                      93.9                10/26/2021                PLT                      191                 10/26/2021              Anesthesia Other Findings   Reproductive/Obstetrics                             Anesthesia Physical Anesthesia Plan  ASA: 4  Anesthesia Plan: General   Post-op Pain Management:    Induction: Intravenous  PONV Risk Score and Plan: 2 and Treatment may vary due to age or medical condition  Airway Management Planned: Oral ETT  Additional Equipment: Arterial line, CVP, PA Cath, TEE and Ultrasound Guidance Line Placement  Intra-op Plan:   Post-operative Plan: Post-operative intubation/ventilation  Informed Consent: I have reviewed the patients History and Physical, chart, labs and discussed the procedure including the risks, benefits and alternatives for the proposed anesthesia with the patient or authorized representative who has indicated his/her understanding and acceptance.     Dental advisory given  Plan Discussed with: CRNA  and Anesthesiologist  Anesthesia Plan Comments:         Anesthesia Quick Evaluation  Anesthesia Physical Anesthesia Plan  ASA: 3  Anesthesia Plan: MAC   Post-op Pain Management: Minimal or no pain anticipated   Induction: Intravenous  PONV Risk Score and Plan: Propofol infusion, TIVA and Treatment may vary due to age or medical condition  Airway Management Planned: Natural Airway and Mask  Additional Equipment: None  Intra-op Plan:   Post-operative Plan:   Informed Consent: I have reviewed the patients History and Physical, chart, labs and discussed the procedure including the risks, benefits and alternatives for the proposed anesthesia with the patient or authorized representative who has indicated his/her understanding and acceptance.  Dental advisory given  Plan Discussed with: CRNA  Anesthesia Plan Comments:         Anesthesia Quick Evaluation

## 2021-11-10 NOTE — Progress Notes (Signed)
Subjective: Overnight events: Hgb 6.0 but pt asymptomatic. 2 units pRBCs transfused.    Patient was seen at bedside during rounds today. Pt reports feeling well. Unsure of melena or hematochezia because he has not had a BM yesterday. Denies CP, SHOB, fevers, and chills. No blood loss that he knows of. He does not report much pain in his right leg.    Pt is updated on the plan for today, and all questions and concerns are addressed.   Objective:  Vital signs in last 24 hours: Vitals:   11/10/21 0719 11/10/21 1010 11/10/21 1140 11/10/21 1239  BP: (!) 111/56 112/63 (!) 108/58 (!) 117/42  Pulse: 87 82 86 88  Resp: 15 12 15 16   Temp: 97.8 F (36.6 C) 98.3 F (36.8 C) 98 F (36.7 C) 98.6 F (37 C)  TempSrc:  Oral Oral Oral  SpO2: 97% 97% 96% 99%   General: alert, and in no acute distress HEENT: Normocephalic, atraumatic, EOM intact, conjunctiva normal CV: RRR, no murmurs rubs or gallops. Sternotomy incision is healing well. Asymmetric lower extremity edema (R>L) which he says occurred after his vein harvesting; sites are healing well. Bandages are dry with no evidence of active bleeding. Right leg does feel more tight compared to the left.  Pulm: Clear to auscultation bilaterally, normal work of breathing Abdomen: Soft, nondistended, bowel sounds present, no tenderness to palpation Skin: Warm and dry, Neuro: Alert and oriented x3, follows commands   Assessment/Plan:  Principal Problem:   Symptomatic anemia Active Problems:   Ischemic cardiomyopathy   CAD (coronary artery disease)   S/P CABG x 4   Lactic acidosis   GI bleed  Symptomatic anemia multifactorial from probable UGIB, right lower extremity hematoma, and post op blood loss from his recent CABG Leukocytosis, improving  Hgb 7.9 yesterday after EGD -> 6.0 this morning, and improved to 8.6 after 2 units of pRBCs. No BM yesterday so he is unsure if he had melena or hematochezia. I suspect that AM CBC could have possibly  been inaccurate, but difficult to tell. EGD showed non-bleeding duodenal ulcer. Suspect RLE hematoma may be contributing to ongoing blood loss, though unlikely to account for 2 units worth. US showing complex fluid collection in the right calf proximally in the area concerning for hematoma; will obtain further imaging to see if there is ongoing blood loss with CT. Leukocytosis improved 22.5 -> 19.4 -> 14.2 with fluid resuscitation; suspect this was reactive and not true infection, given afebrile, no systemic s/sxs of  infection, and BC with 1/4 bottles growing GPC with BCID detecting MSSE presumably a contaminant. If leukocytosis does not continue to improve, will consider infection in hematoma as a possible source.  - F/u CT right LE w wo contrast  - Trend CBC  - Evaluated by CT surgery this AM: transfuse to maintain Hgb >8.5, and discontinue Plavix indefinitely  - Holding ASA for 3-5 days after EGD, will resume 2/28 - PPI BID x 1 month and then QD - GI signed off - SCDs for VTE prophylaxis   AKI, resolved  Creatinine back to baseline (0.9-1) with IVF and pRBCs.  - Trend BMP - IV fluids - Avoid nephrotoxins    CAD STEMI in 2013  S/p quadruple bypass 10/20/21 Ischemic cardiomyopathy - Holding aspirin after EGD, will resume on 2/28 - Discontinue Plavix indefinitely per CT surgery  - Holding metoprolol 12.5 mg daily in the setting of bleed   Paroxysmal atrial fibrillation NSR and rate controlled.  -  Continue amiodarone 200 mg daily - Holding metoprolol in the setting of possible GI bleed   Hypertension Patient is normotensive at this time.    Hyperlipidemia - continue atorvastatin 80 mg daily  Best Practice: Diet: NPO IVF: LR 100cc/hr VTE: SCDs Code: Full   Lajean Manes, MD  Internal Medicine Resident, PGY-1 Pager: [phone] After 5pm on weekdays and 1pm on weekends: On Call pager [phone]

## 2021-11-10 NOTE — Hospital Course (Addendum)
Resolved symptomatic anemia multifactorial from probable UGIB, right lower extremity hematoma, and post op blood loss from his recent CABG Leukocytosis, improved   Patient presented with diaphoresis, paleness and cool to touch. Had a syncopal episode on EMS arrival and found to be hypotensive and tachycardic. In the ER he was found to be hypothermic, hypotensive and tachycardic.  He had another syncopal episode in the ED and was briefly unresponsive. During this episode found to have melena in his stool. Fecal occult positive. Hemoglobin 4.8, improved to 8.1 after 2 units PRBCs. Blood pressures have improved since arrival. Patient denies any blood in his stool or melena.  History of colorectal polyps and hemorrhoids; last colonoscopy in 2010. Patient was recently started on aspirin and Plavix status post quadruple bypass. I do not suspect he is having bleed or complication from his recent CABG.  No signs of cardiac tamponade on exam or EKG. Lactic acid 7.5>2.2, likely in the setting of hypovolemia and bleed.  I suspect hypothermia is in the setting of hypovolemia secondary to his bleed and leukocytosis may also be reactive. No s/sxs of infectious etiology. Hgb improved from 4.8 (admission) to 8.1 with 2 units PRBCs but then dropped to 7.1 later that evening. Hgb 5.9 the next morning. Improved and then dropped to 6 the next day after 1 unit. Hemoglobin 8.6 after transfusion of 2 units of packed red blood cells. His hemoglobin has remained stable with no ongoing signs of bleeding.  He has had no bowel movements at time of discharge but bowel regimen started. CT of the patient's right lower extremity with likely hematoma in the subcutaneous tissues.  Leukocytosis improved 22.5 -> 19.4 -> 14.2 with fluid resuscitation; suspect this was reactive and not true infection, given afebrile, no systemic s/sxs of  infection, and BC with 1/4 bottles growing GPC with BCID detecting MSSE presumably a contaminant. He will follow  up with CT surgery on 3/13 and cardiology on 3/7.  - Trended CBC  - Evaluated by CT surgery; transfuse to maintain Hgb >8.5, and discontinue Plavix indefinitely  - Holding ASA for 3-5 days after EGD, will resume 2/28 - PPI BID x 1 month and then QD - SCDs for VTE prophylaxis  AKI, resolved  Creatinine back to baseline (0.9-1) with IVF and pRBCs.  - Trended BMP - IV fluids - Avoided nephrotoxins    CAD STEMI in 2013  S/p quadruple bypass   Ischemic cardiomyopathy - Holding aspirin after EGD, will resume on 2/28 - Discontinue Plavix indefinitely per CT surgery  - Holding metoprolol 12.5 mg daily in the setting of bleed   Paroxysmal atrial fibrillation Prolonged QTc NSR and rate controlled. New; surrounding his CABG earlier this month. Was started on Amio and metop.  - Continued amiodarone 200 mg daily - Holding metoprolol in the setting soft blood pressures. - Noticed prolong Qtc 530 on day of discharge. Appears that this is new since starting amiodarone after CABG this month. Spoke with Dr Lawson Fiscal who is fine with continuing his metop and discontinuing his amio, and will follow up with him in clinic on 3/13. He will also follow up with cardiology on 3/7.    Hypertension Patient is normotensive at this time.  -Held antihypertensives during stay d/t soft pressures with GI bleed.    Hyperlipidemia - continued atorvastatin 80 mg daily

## 2021-11-10 NOTE — Interval H&P Note (Signed)
History and Physical Interval Note:  11/10/2021 12:53 PM  Jorge Fisher  has presented today for surgery, with the diagnosis of GI bleed.  The various methods of treatment have been discussed with the patient and family. After consideration of risks, benefits and other options for treatment, the patient has consented to  Procedure(s): ESOPHAGOGASTRODUODENOSCOPY (EGD) WITH PROPOFOL (N/A) as a surgical intervention.  The patient's history has been reviewed, patient examined, no change in status, stable for surgery.  I have reviewed the patient's chart and labs.  Questions were answered to the patient's satisfaction.     Dsean Vantol D

## 2021-11-10 NOTE — Progress Notes (Signed)
°  Transition of Care [facility]) Screening Note   Patient Details  Name: Jorge Fisher Date of Birth: 1955-11-11   Transition of Care [facility]) CM/SW Contact:    Benard Halsted, LCSW Phone Number: 11/10/2021, 11:21 AM    Transition of Care Department [facility]) has reviewed patient and no TOC needs have been identified at this time. We will continue to monitor patient advancement through interdisciplinary progression rounds. If new patient transition needs arise, please place a TOC consult.

## 2021-11-10 NOTE — Transfer of Care (Signed)
Immediate Anesthesia Transfer of Care Note  Patient: Jorge Fisher  Procedure(s) Performed: ESOPHAGOGASTRODUODENOSCOPY (EGD) WITH PROPOFOL BIOPSY  Patient Location: PACU  Anesthesia Type:MAC  Level of Consciousness: drowsy  Airway & Oxygen Therapy: Patient Spontanous Breathing  Post-op Assessment: Report given to RN and Post -op Vital signs reviewed and stable  Post vital signs: Reviewed and stable  Last Vitals:  Vitals Value Taken Time  BP 96/53 11/10/21 1356  Temp    Pulse 82 11/10/21 1356  Resp 20 11/10/21 1356  SpO2 100 % 11/10/21 1356  Vitals shown include unvalidated device data.  Last Pain:  Vitals:   11/10/21 1239  TempSrc: Oral  PainSc: 0-No pain         Complications: No notable events documented.

## 2021-11-10 NOTE — Anesthesia Postprocedure Evaluation (Signed)
Anesthesia Post Note  Patient: Jorge Fisher  Procedure(s) Performed: ESOPHAGOGASTRODUODENOSCOPY (EGD) WITH PROPOFOL BIOPSY     Patient location during evaluation: Endoscopy Anesthesia Type: MAC Level of consciousness: awake Pain management: pain level controlled Vital Signs Assessment: post-procedure vital signs reviewed and stable Respiratory status: spontaneous breathing Cardiovascular status: stable Postop Assessment: no apparent nausea or vomiting Anesthetic complications: no   No notable events documented.  Last Vitals:  Vitals:   11/10/21 1239 11/10/21 1355  BP: (!) 117/42 (!) 62/54  Pulse: 88 85  Resp: 16 14  Temp: 37 C   SpO2: 99% 100%    Last Pain:  Vitals:   11/10/21 1355  TempSrc:   PainSc: 0-No pain                 Huston Foley

## 2021-11-10 NOTE — Op Note (Signed)
[facility] Patient Name: Jorge Fisher Procedure Date : 11/10/2021 MRN: [id number] Attending MD: Carol Ada , MD Date of Birth: 08/13/1956 CSN: [id number] Age: 66 Admit Type: Inpatient Procedure:                Upper GI endoscopy Indications:              Acute post hemorrhagic anemia, Heme positive stool,                            Melena Providers:                Carol Ada, MD, Doristine Johns, RN, Ervin Knack, RN Referring MD:              Medicines:                Propofol per Anesthesia Complications:            No immediate complications. Estimated Blood Loss:     Estimated blood loss: none. Procedure:                Pre-Anesthesia Assessment:                           - Prior to the procedure, a History and Physical                            was performed, and patient medications and                            allergies were reviewed. The patient's tolerance of                            previous anesthesia was also reviewed. The risks                            and benefits of the procedure and the sedation                            options and risks were discussed with the patient.                            All questions were answered, and informed consent                            was obtained. Prior Anticoagulants: The patient has                            taken no previous anticoagulant or antiplatelet                            agents. ASA Grade Assessment: III - A patient with                            severe  systemic disease. After reviewing the risks                            and benefits, the patient was deemed in                            satisfactory condition to undergo the procedure.                           - Sedation was administered by an anesthesia                            professional. Deep sedation was attained.                           After obtaining informed consent, the endoscope was                             passed under direct vision. Throughout the                            procedure, the patient's blood pressure, pulse, and                            oxygen saturations were monitored continuously. The                            [id number]-H190 ([id number] Olympus endoscope was introduced                            through the mouth, and advanced to the second part                            of duodenum. The upper GI endoscopy was                            accomplished without difficulty. The patient                            tolerated the procedure well. Scope In: Scope Out: Findings:      The esophagus was normal.      Patchy mild inflammation characterized by erythema was found in the       gastric antrum. Biopsies were taken with a cold forceps for Helicobacter       pylori testing.      One non-bleeding cratered and superficial duodenal ulcer with no       stigmata of bleeding was found in the duodenal bulb. The lesion was 25       mm in largest dimension.      A large serpiginous ulcer was noted from the distal duodenal bulb with       extension towards D2. No active bleeding, friability, or visible vessel       was noted. Impression:               - Normal esophagus.                           -  Gastritis. Biopsied.                           - Non-bleeding duodenal ulcer with no stigmata of                            bleeding. Recommendation:           - Return patient to hospital ward for ongoing care.                           - Resume regular diet.                           - Continue present medications.                           - Await pathology results.                           - PPI BID x 1 month and then QD.                           - Follow up in the office in one month.                           - Hold Plavix and ASA for another three days, but 5                            days is preferable, if possible. Procedure Code(s):        --- Professional ---                            [phone], Esophagogastroduodenoscopy, flexible,                            transoral; with biopsy, single or multiple Diagnosis Code(s):        --- Professional ---                           K26.9, Duodenal ulcer, unspecified as acute or                            chronic, without hemorrhage or perforation                           K29.70, Gastritis, unspecified, without bleeding                           D62, Acute posthemorrhagic anemia                           R19.5, Other fecal abnormalities                           K92.1, Melena (includes Hematochezia) CPT copyright 2019 American Medical Association. All rights reserved. The codes documented in this  report are preliminary and upon coder review may  be revised to meet current compliance requirements. Carol Ada, MD Carol Ada, MD 11/10/2021 1:57:08 PM This report has been signed electronically. Number of Addenda: 0

## 2021-11-10 NOTE — Plan of Care (Signed)
Jorge Fisher is a 66 y.o. male patient. 1. Acute upper GI bleed   2. Syncope and collapse    Past Medical History:  Diagnosis Date   CAD (coronary artery disease)    a) diagnosed with acute anterior STEMI with totally occluded LAD s/p PTCA/DES x 2 to prox and mid LAD 02/08/12, staged PTCA/DES to Cx 02/12/12, diagonal dz for med rx, & chronically occ RCA with collaterals.    Colorectal polyps    Hemorrhoids    Hypertension    Mild HTN in past, but more recently borderline low blood pressures   Ischemic cardiomyopathy    Improved - EF 25-30% by cath 02/08/12 then 50-55% by echo 02/11/12   Current Facility-Administered Medications  Medication Dose Route Frequency Provider Last Rate Last Admin   acetaminophen (TYLENOL) tablet 650 mg  650 mg Oral Q6H PRN Rehman, Areeg N, DO       Or   acetaminophen (TYLENOL) suppository 650 mg  650 mg Rectal Q6H PRN Rehman, Areeg N, DO       amiodarone (PACERONE) tablet 200 mg  200 mg Oral Daily Rehman, Areeg N, DO   200 mg at 11/09/21 1139   ascorbic acid (VITAMIN C) tablet 1,000 mg  1,000 mg Oral Daily Rehman, Areeg N, DO       atorvastatin (LIPITOR) tablet 80 mg  80 mg Oral QHS Rehman, Areeg N, DO   80 mg at 11/09/21 2220   cholecalciferol (VITAMIN D3) tablet 1,000 Units  1,000 Units Oral Daily Rehman, Areeg N, DO       ferrous [id number] C-folic acid (TRINSICON / FOLTRIN) capsule 1 capsule  1 capsule Oral BID PC Rehman, Areeg N, DO       insulin aspart (novoLOG) injection 0-15 Units  0-15 Units Subcutaneous TID WC Rehman, Areeg N, DO   2 Units at 11/09/21 1758   lactated ringers infusion   Intravenous Continuous Rehman, Areeg N, DO 100 mL/hr at 11/09/21 2332 New Bag at 11/09/21 2332   omega-3 acid ethyl esters (LOVAZA) capsule 1 g  1 g Oral Daily Rehman, Areeg N, DO       ondansetron (ZOFRAN) tablet 4 mg  4 mg Oral Q6H PRN Rehman, Areeg N, DO       Or   ondansetron (ZOFRAN) injection 4 mg  4 mg Intravenous Q6H PRN Rehman, Areeg N, DO        pantoprazole (PROTONIX) injection 40 mg  40 mg Intravenous Q12H Rehman, Areeg N, DO   40 mg at 11/09/21 1758   Allergies  Allergen Reactions   Peanuts [Peanut Oil] Swelling    cashews   Principal Problem:   Symptomatic anemia Active Problems:   Ischemic cardiomyopathy   CAD (coronary artery disease)   S/P CABG x 4   Lactic acidosis   GI bleed  Blood pressure 115/66, pulse 99, temperature 98 F (36.7 C), temperature source Oral, resp. rate 18, SpO2 99 %.  Subjective Objective Assessment & Plan  Maxie Barb 11/10/2021

## 2021-11-10 NOTE — Progress Notes (Signed)
PHARMACY - PHYSICIAN COMMUNICATION CRITICAL VALUE ALERT - BLOOD CULTURE IDENTIFICATION (BCID)  Jorge Fisher is an 66 y.o. male who presented to [facility] on 11/09/2021 with a chief complaint of SOB/syncope  Assessment:  71 YOM with recent quadruple bypass on 2/3 with syncopal episode found to have symptomatic anemia thought to be from a GIB. Now with 1 of 4 blood cultures growing GPC with BCID detecting MSSE - presumably a contaminant.   Name of physician (or Provider) Contacted:  [facility] (Patel[facility])  Current antibiotics: None  Changes to prescribed antibiotics recommended:  Presumed contaminant - no antibiotics needed at this time  Results for orders placed or performed during the hospital encounter of 11/09/21  Blood Culture ID Panel (Reflexed) (Collected: 11/09/2021  7:19 AM)  Result Value Ref Range   Enterococcus faecalis NOT DETECTED NOT DETECTED   Enterococcus Faecium NOT DETECTED NOT DETECTED   Listeria monocytogenes NOT DETECTED NOT DETECTED   Staphylococcus species DETECTED (A) NOT DETECTED   Staphylococcus aureus (BCID) NOT DETECTED NOT DETECTED   Staphylococcus epidermidis DETECTED (A) NOT DETECTED   Staphylococcus lugdunensis NOT DETECTED NOT DETECTED   Streptococcus species NOT DETECTED NOT DETECTED   Streptococcus agalactiae NOT DETECTED NOT DETECTED   Streptococcus pneumoniae NOT DETECTED NOT DETECTED   Streptococcus pyogenes NOT DETECTED NOT DETECTED   A.calcoaceticus-baumannii NOT DETECTED NOT DETECTED   Bacteroides fragilis NOT DETECTED NOT DETECTED   Enterobacterales NOT DETECTED NOT DETECTED   Enterobacter cloacae complex NOT DETECTED NOT DETECTED   Escherichia coli NOT DETECTED NOT DETECTED   Klebsiella aerogenes NOT DETECTED NOT DETECTED   Klebsiella oxytoca NOT DETECTED NOT DETECTED   Klebsiella pneumoniae NOT DETECTED NOT DETECTED   Proteus species NOT DETECTED NOT DETECTED   Salmonella species NOT DETECTED NOT DETECTED   Serratia marcescens NOT  DETECTED NOT DETECTED   Haemophilus influenzae NOT DETECTED NOT DETECTED   Neisseria meningitidis NOT DETECTED NOT DETECTED   Pseudomonas aeruginosa NOT DETECTED NOT DETECTED   Stenotrophomonas maltophilia NOT DETECTED NOT DETECTED   Candida albicans NOT DETECTED NOT DETECTED   Candida auris NOT DETECTED NOT DETECTED   Candida glabrata NOT DETECTED NOT DETECTED   Candida krusei NOT DETECTED NOT DETECTED   Candida parapsilosis NOT DETECTED NOT DETECTED   Candida tropicalis NOT DETECTED NOT DETECTED   Cryptococcus neoformans/gattii NOT DETECTED NOT DETECTED   Methicillin resistance mecA/C NOT DETECTED NOT DETECTED    Thank you for allowing pharmacy to be a part of this patients care.  Alycia Rossetti, PharmD, BCPS Infectious Diseases Clinical Pharmacist 11/10/2021 11:25 AM   **Pharmacist phone directory can now be found on [email or web address] ([email or web address]).  Listed under [place].

## 2021-11-11 ENCOUNTER — Inpatient Hospital Stay (HOSPITAL_COMMUNITY): Payer: 59

## 2021-11-11 ENCOUNTER — Other Ambulatory Visit: Payer: Self-pay

## 2021-11-11 DIAGNOSIS — I2510 Atherosclerotic heart disease of native coronary artery without angina pectoris: Secondary | ICD-10-CM

## 2021-11-11 DIAGNOSIS — I252 Old myocardial infarction: Secondary | ICD-10-CM

## 2021-11-11 DIAGNOSIS — E785 Hyperlipidemia, unspecified: Secondary | ICD-10-CM

## 2021-11-11 DIAGNOSIS — D649 Anemia, unspecified: Secondary | ICD-10-CM

## 2021-11-11 DIAGNOSIS — I480 Paroxysmal atrial fibrillation: Secondary | ICD-10-CM

## 2021-11-11 DIAGNOSIS — I255 Ischemic cardiomyopathy: Secondary | ICD-10-CM

## 2021-11-11 DIAGNOSIS — I10 Essential (primary) hypertension: Secondary | ICD-10-CM

## 2021-11-11 LAB — BASIC METABOLIC PANEL
Anion gap: 5 (ref 5–15)
BUN: 26 mg/dL — ABNORMAL HIGH (ref 8–23)
CO2: 23 mmol/L (ref 22–32)
Calcium: 7.7 mg/dL — ABNORMAL LOW (ref 8.9–10.3)
Chloride: 110 mmol/L (ref 98–111)
Creatinine, Ser: 1.23 mg/dL (ref 0.61–1.24)
GFR, Estimated: >60 mL/min (ref >60)
Glucose, Bld: 112 mg/dL — ABNORMAL HIGH (ref 70–99)
Potassium: 3.6 mmol/L (ref 3.5–5.1)
Sodium: 138 mmol/L (ref 135–145)

## 2021-11-11 LAB — CBC
HCT: 18.8 % — ABNORMAL LOW (ref 39.0–52.0)
HCT: 26.7 % — ABNORMAL LOW (ref 39.0–52.0)
Hemoglobin: 6 g/dL — CL (ref 13.0–17.0)
Hemoglobin: 8.5 g/dL — ABNORMAL LOW (ref 13.0–17.0)
MCH: 30 pg (ref 26.0–34.0)
MCH: 29.6 pg (ref 26.0–34.0)
MCHC: 31.9 g/dL (ref 30.0–36.0)
MCHC: 31.8 g/dL (ref 30.0–36.0)
MCV: 94 fL (ref 80.0–100.0)
MCV: 93 fL (ref 80.0–100.0)
Platelets: 194 10*3/uL (ref 150–400)
Platelets: 192 10*3/uL (ref 150–400)
RBC: 2 MIL/uL — ABNORMAL LOW (ref 4.22–5.81)
RBC: 2.87 MIL/uL — ABNORMAL LOW (ref 4.22–5.81)
RDW: 21.2 % — ABNORMAL HIGH (ref 11.5–15.5)
RDW: 20.2 % — ABNORMAL HIGH (ref 11.5–15.5)
WBC: 14.2 10*3/uL — ABNORMAL HIGH (ref 4.0–10.5)
WBC: 10.8 10*3/uL — ABNORMAL HIGH (ref 4.0–10.5)
nRBC: 0.6 % — ABNORMAL HIGH (ref 0.0–0.2)
nRBC: 0.7 % — ABNORMAL HIGH (ref 0.0–0.2)

## 2021-11-11 LAB — GLUCOSE, CAPILLARY
Glucose-Capillary: 107 mg/dL — ABNORMAL HIGH (ref 70–99)
Glucose-Capillary: 94 mg/dL (ref 70–99)
Glucose-Capillary: 95 mg/dL (ref 70–99)
Glucose-Capillary: 95 mg/dL (ref 70–99)
Glucose-Capillary: 100 mg/dL — ABNORMAL HIGH (ref 70–99)

## 2021-11-11 LAB — HEMOGLOBIN AND HEMATOCRIT, BLOOD
HCT: 26.4 % — ABNORMAL LOW (ref 39.0–52.0)
Hemoglobin: 8.6 g/dL — ABNORMAL LOW (ref 13.0–17.0)

## 2021-11-11 LAB — PREPARE RBC (CROSSMATCH)

## 2021-11-11 MED ORDER — SODIUM CHLORIDE 0.9% IV SOLUTION: Freq: Once | INTRAVENOUS | Status: AC

## 2021-11-11 MED ORDER — POLYETHYLENE GLYCOL 3350 17 G PO PACK
17.0000 g | PACK | Freq: Every day | ORAL | Status: DC
  Administered 2021-11-13: 17 g via ORAL
  Filled 2021-11-11 (×3): qty 1

## 2021-11-11 MED ORDER — ENOXAPARIN SODIUM 40 MG/0.4ML IJ SOSY
40.0000 mg | PREFILLED_SYRINGE | INTRAMUSCULAR | Status: DC
  Administered 2021-11-11 – 2021-11-13 (×3): 40 mg via SUBCUTANEOUS
  Filled 2021-11-11 (×3): qty 0.4

## 2021-11-11 NOTE — Plan of Care (Signed)

## 2021-11-11 NOTE — Progress Notes (Signed)
1 Day Post-Op Procedure(s) (LRB): ESOPHAGOGASTRODUODENOSCOPY (EGD) WITH PROPOFOL (N/A) BIOPSY Subjective: Patient examined and medical record reviewed Sig GI bleed from peptic ulcer disease req transfusion Postop plavix for preop nonstemi- needs to be discontinued and only 60 ECASA resumed after ulcer heals Objective: Vital signs in last 24 hours: Temp:  [97.4 F (36.3 C)-98.9 F (37.2 C)] 98.6 F (37 C) (02/25 0955) Pulse Rate:  [79-96] 85 (02/25 0955) Cardiac Rhythm: Normal sinus rhythm (02/25 0700) Resp:  [12-20] 18 (02/25 0955) BP: (62-117)/(42-63) 108/60 (02/25 0955) SpO2:  [95 %-100 %] 97 % (02/25 0955)  Hemodynamic parameters for last 24 hours:    Intake/Output from previous day: 02/24 0701 - 02/25 0700 In: 1794.5 [I.V.:1479.5; Blood:315] Out: 1150 [Urine:1150] Intake/Output this shift: Total I/O In: 1214.6 [Blood:1214.6] Out: -     Lab Results: Recent Labs    11/10/21 1643 11/11/21 0137  WBC 22.0* 14.2*  HGB 7.9* 6.0*  HCT 24.7* 18.8*  PLT 229 194   BMET:  Recent Labs    11/10/21 0423 11/11/21 0137  NA 139 138  K 4.0 3.6  CL 111 110  CO2 22 23  GLUCOSE 104* 112*  BUN 33* 26*  CREATININE 1.24 1.23  CALCIUM 7.9* 7.7*    PT/INR:  Recent Labs    11/09/21 0719  LABPROT 16.4*  INR 1.3*   ABG    Component Value Date/Time   PHART 7.318 (L) 10/27/2021 0043   HCO3 20.1 10/27/2021 0043   TCO2 19 (L) 11/09/2021 0710   ACIDBASEDEF 6.0 (H) 10/27/2021 0043   O2SAT 71.1 10/29/2021 0449   CBG (last 3)  Recent Labs    11/10/21 1526 11/11/21 0233 11/11/21 0734  GLUCAP 78 107* 94    Assessment/Plan: S/P Procedure(s) (LRB): ESOPHAGOGASTRODUODENOSCOPY (EGD) WITH PROPOFOL (N/A) BIOPSY Transfuse to keep Hb 8.5 No more plavix  LOS: 2 days    Dahlia Byes 11/11/2021

## 2021-11-11 NOTE — Progress Notes (Addendum)
Pt stated that CBG for 2200 done and it was 92 and tech confirmed that it was done data did not transfer. Could not find the results on the machine so I checked it and it was 107.

## 2021-11-12 ENCOUNTER — Encounter (HOSPITAL_COMMUNITY): Payer: Self-pay | Admitting: Gastroenterology

## 2021-11-12 LAB — BPAM RBC
Blood Product Expiration Date: 202303042359
Blood Product Expiration Date: 202303082359
Blood Product Expiration Date: 202303082359
Blood Product Expiration Date: 202303192359
Blood Product Expiration Date: 202303222359
ISSUE DATE / TIME: 202302230713
ISSUE DATE / TIME: 202302230816
ISSUE DATE / TIME: 202302240659
ISSUE DATE / TIME: 202302250336
ISSUE DATE / TIME: 202302250623
Unit Type and Rh: 5100
Unit Type and Rh: 5100
Unit Type and Rh: 6200
Unit Type and Rh: 6200
Unit Type and Rh: 6200

## 2021-11-12 LAB — TYPE AND SCREEN
ABO/RH(D): A POS
Antibody Screen: NEGATIVE
Unit division: 0
Unit division: 0
Unit division: 0
Unit division: 0
Unit division: 0

## 2021-11-12 LAB — GLUCOSE, CAPILLARY
Glucose-Capillary: 102 mg/dL — ABNORMAL HIGH (ref 70–99)
Glucose-Capillary: 102 mg/dL — ABNORMAL HIGH (ref 70–99)
Glucose-Capillary: 117 mg/dL — ABNORMAL HIGH (ref 70–99)
Glucose-Capillary: 136 mg/dL — ABNORMAL HIGH (ref 70–99)

## 2021-11-12 LAB — BASIC METABOLIC PANEL
Anion gap: 5 (ref 5–15)
BUN: 19 mg/dL (ref 8–23)
CO2: 24 mmol/L (ref 22–32)
Calcium: 7.8 mg/dL — ABNORMAL LOW (ref 8.9–10.3)
Chloride: 108 mmol/L (ref 98–111)
Creatinine, Ser: 1.19 mg/dL (ref 0.61–1.24)
GFR, Estimated: >60 mL/min (ref >60)
Glucose, Bld: 93 mg/dL (ref 70–99)
Potassium: 3.8 mmol/L (ref 3.5–5.1)
Sodium: 137 mmol/L (ref 135–145)

## 2021-11-12 LAB — CBC
HCT: 25.5 % — ABNORMAL LOW (ref 39.0–52.0)
Hemoglobin: 8.6 g/dL — ABNORMAL LOW (ref 13.0–17.0)
MCH: 31.3 pg (ref 26.0–34.0)
MCHC: 33.7 g/dL (ref 30.0–36.0)
MCV: 92.7 fL (ref 80.0–100.0)
Platelets: 183 10*3/uL (ref 150–400)
RBC: 2.75 MIL/uL — ABNORMAL LOW (ref 4.22–5.81)
RDW: 20.3 % — ABNORMAL HIGH (ref 11.5–15.5)
WBC: 10.4 10*3/uL (ref 4.0–10.5)
nRBC: 0.8 % — ABNORMAL HIGH (ref 0.0–0.2)

## 2021-11-12 LAB — CULTURE, BLOOD (ROUTINE X 2): Special Requests: ADEQUATE

## 2021-11-12 LAB — IRON AND TIBC
Iron: 22 ug/dL — ABNORMAL LOW (ref 45–182)
Saturation Ratios: 9 % — ABNORMAL LOW (ref 17.9–39.5)
TIBC: 249 ug/dL — ABNORMAL LOW (ref 250–450)
UIBC: 227 ug/dL

## 2021-11-12 LAB — FERRITIN: Ferritin: 172 ng/mL (ref 24–336)

## 2021-11-12 MED ORDER — LACTULOSE 10 GM/15ML PO SOLN
10.0000 g | Freq: Once | ORAL | Status: AC
  Administered 2021-11-12: 10 g via ORAL
  Filled 2021-11-12: qty 15

## 2021-11-12 NOTE — Progress Notes (Signed)
° °  Subjective: Overnight events: No acute events overnight  Patient was seen at bedside during rounds today. Pt reports feeling well.  Has not had another bowel movement. Denies CP, SHOB, fevers, and chills. No blood loss that he knows of. He does not report much pain in his right leg.  Feels like the swelling in his right lower extremity is improving  Objective:  Vital signs in last 24 hours: Vitals:   11/12/21 0354 11/12/21 0736 11/12/21 1113 11/12/21 1533  BP: 112/62 125/68 108/70 116/68  Pulse: 79 79 78 84  Resp: 16 16 16 17   Temp: 98.5 F (36.9 C) 99.1 F (37.3 C) 98 F (36.7 C) 98.2 F (36.8 C)  TempSrc: Oral Axillary Oral Oral  SpO2: 94% 91% 96% 96%  Height:       General: alert, and in no acute distress HEENT: Normocephalic, atraumatic, EOM intact, conjunctiva normal CV: RRR, no murmurs rubs or gallops. Sternotomy incision is healing well. Asymmetric lower extremity edema (R>L) which he says occurred after his vein harvesting; sites are healing well. Bandages are dry with no evidence of active bleeding.  Right lower extremity compartments soft and nontender to palpation.   Pulm: Clear to auscultation bilaterally, normal work of breathing Abdomen: Soft, nondistended, bowel sounds present, no tenderness to palpation Skin: Warm and dry, Neuro: Alert and oriented x3, follows commands   Assessment/Plan:  Principal Problem:   Symptomatic anemia Active Problems:   Ischemic cardiomyopathy   CAD (coronary artery disease)   S/P CABG x 4   Lactic acidosis   GI bleed  Symptomatic anemia multifactorial from probable UGIB, right lower extremity hematoma, and post op blood loss from his recent CABG Leukocytosis, improving  Hemoglobin 8.6 after transfusion of 2 units of packed red blood cells.  His hemoglobin has remained stable this morning.  He has had no bowel movements yet.  Unclear etiology of patient's anemia.  CT of the patient's right lower extremity with likely hematoma  in the subcutaneous tissues.  Given his hemoglobin is stable at this time continue to monitor. - Trend CBC  - Evaluated by CT surgery; transfuse to maintain Hgb >8.5, and discontinue Plavix indefinitely  - Holding ASA for 3-5 days after EGD, will resume 2/28 - PPI BID x 1 month and then QD - GI signed off - SCDs for VTE prophylaxis   AKI, resolved  Creatinine back to baseline (0.9-1) with IVF and pRBCs.  - Trend BMP - IV fluids - Avoid nephrotoxins    CAD STEMI in 2013  S/p quadruple bypass 10/20/21 Ischemic cardiomyopathy - Holding aspirin after EGD, will resume on 2/28 - Discontinue Plavix indefinitely per CT surgery  - Holding metoprolol 12.5 mg daily in the setting of bleed   Paroxysmal atrial fibrillation NSR and rate controlled.  - Continue amiodarone 200 mg daily - Holding metoprolol in the setting soft blood pressures.   Hypertension Patient is normotensive at this time.  -Will hold antihypertensives   Hyperlipidemia - continue atorvastatin 80 mg daily  Best Practice: Diet: Regular diet  IVF: None VTE: Lovenox  Code: Full  Rick Duff, MD PGY-2 Internal Medicine  Pager [phone]  After 5pm on weekdays and 1pm on weekends: On Call pager [phone]

## 2021-11-12 NOTE — Progress Notes (Incomplete)
° °  Subjective: Overnight events: None  Patient was seen at bedside during rounds today. Reports feeling well. No bowel movement yesterday. Denies CP, SHOB, fevers, and chills. No blood loss that he knows of. He does not report much pain in his right leg. Feels like the swelling in his right lower extremity is improving  Objective:  Vital signs in last 24 hours: Vitals:   11/12/21 0736 11/12/21 1113 11/12/21 1533 11/12/21 2056  BP: 125/68 108/70 116/68 124/70  Pulse: 79 78 84 79  Resp: 16 16 17 17   Temp: 99.1 F (37.3 C) 98 F (36.7 C) 98.2 F (36.8 C) 98.5 F (36.9 C)  TempSrc: Axillary Oral Oral Oral  SpO2: 91% 96% 96% 98%  Height:       General: alert, and in no acute distress HEENT: Normocephalic, atraumatic, EOM intact, conjunctiva normal CV: RRR, no murmurs rubs or gallops. Sternotomy incision is healing well. Asymmetric lower extremity edema (R>L) which he says occurred after his vein harvesting; sites are healing well. Bandages are dry with no evidence of active bleeding.  Right lower extremity compartments soft and nontender to palpation.   Pulm: Clear to auscultation bilaterally, normal work of breathing Abdomen: Soft, nondistended, bowel sounds present, no tenderness to palpation Skin: Warm and dry, Neuro: Alert and oriented x3, follows commands   Assessment/Plan:  Principal Problem:   Symptomatic anemia Active Problems:   Ischemic cardiomyopathy   CAD (coronary artery disease)   S/P CABG x 4   Lactic acidosis   GI bleed  120/70, HR 75, 98.4  8.6 -> 9.4  Cr 1.5 admission -> 1 today   Resolved symptomatic anemia multifactorial from probable UGIB, right lower extremity hematoma, and post op blood loss from his recent CABG Leukocytosis, improving  Hgb improved 8.6 -> 9.4, requiring no additional transfusions. Reports no known blood loss. Has not has a BM yesterday. Unclear etiology of anemia, but likely multifactorial as above. Right LE CT with likely hematoma  in the subcutaneous tissues; CT surgeon opened a small incision and drained about 75 mL of serosanguineous fluid that was clear and not infected-- they will follow the pt in the office for this. Given stable Hgb with no ongoing s/sxs of blood loss, will monitor for additional day and likely DC tmrw. *** - Trend CBC, transfuse to maintain Hgb >8.5 - Holding ASA for 3-5 days after EGD, will resume 2/28 - Discontinued Plavix by CT surgery   - PPI BID x 1 month and then QD - GI signed off - SCDs for VTE prophylaxis   AKI, resolved  Cr 1, back to baseline (0.9-1) with IVF and pRBCs.  - Trend BMP - IV fluids - Avoid nephrotoxins    CAD STEMI in 2013  S/p quadruple bypass 10/20/21 Ischemic cardiomyopathy - Holding ASA after EGD, will resume on 2/28 - Discontinued Plavix by CT surgery   - Holding metoprolol 12.5 mg daily in the setting of bleed   Paroxysmal atrial fibrillation NSR and rate controlled.   - Continue amiodarone 200 mg daily - Holding metoprolol in the setting soft blood pressures.   HTN  Patient is normotensive at this time.   -Will hold antihypertensives   HLD - continue atorvastatin 80 mg daily  Best Practice: Diet: Regular diet  IVF: None VTE: Lovenox  Code: Full   Lajean Manes, MD  Internal Medicine Resident, PGY-1 [facility]  Pager: [phone]

## 2021-11-12 NOTE — Progress Notes (Signed)
2 Days Post-Op Procedure(s) (LRB): ESOPHAGOGASTRODUODENOSCOPY (EGD) WITH PROPOFOL (N/A) BIOPSY Subjective: Hemoglobin stable, no clinical evidence of GI bleeding No abdominal pain  CT scan of right leg images personally reviewed. Patient has serosanguineous collection of fluid in the right subcutaneous endovein tunnel  I opened a small incision above the right ankle and probe approximately and drained approximately 75 mL of serosanguineous fluid from the end of tunnel and placed a sterile dressing over site.  Fluid was clear and not infected.  Objective: Vital signs in last 24 hours: Temp:  [97.4 F (36.3 C)-99.1 F (37.3 C)] 98 F (36.7 C) (02/26 1113) Pulse Rate:  [78-89] 78 (02/26 1113) Resp:  [16-20] 16 (02/26 1113) BP: (108-125)/(60-72) 108/70 (02/26 1113) SpO2:  [91 %-97 %] 96 % (02/26 1113)  Hemodynamic parameters for last 24 hours: Stable  Intake/Output from previous day: 02/25 0701 - 02/26 0700 In: 2361.9 [I.V.:1147.3; Blood:1214.6] Out: 1000 [Urine:1000] Intake/Output this shift: No intake/output data recorded.  Sternal incision well-healed Leg endoveintunnel fluid collection expressed at lower wound site  Lab Results: Recent Labs    11/11/21 2041 11/12/21 0022  WBC 10.8* 10.4  HGB 8.5* 8.6*  HCT 26.7* 25.5*  PLT 192 183   BMET:  Recent Labs    11/11/21 0137 11/12/21 0022  NA 138 137  K 3.6 3.8  CL 110 108  CO2 23 24  GLUCOSE 112* 93  BUN 26* 19  CREATININE 1.23 1.19  CALCIUM 7.7* 7.8*    PT/INR: No results for input(s): LABPROT, INR in the last 72 hours. ABG    Component Value Date/Time   PHART 7.318 (L) 10/27/2021 0043   HCO3 20.1 10/27/2021 0043   TCO2 19 (L) 11/09/2021 0710   ACIDBASEDEF 6.0 (H) 10/27/2021 0043   O2SAT 71.1 10/29/2021 0449   CBG (last 3)  Recent Labs    11/11/21 1950 11/12/21 0742 11/12/21 1112  GLUCAP 100* 102* 102*    Assessment/Plan: S/P Procedure(s) (LRB): ESOPHAGOGASTRODUODENOSCOPY (EGD) WITH PROPOFOL  (N/A) BIOPSY We will follow the patient in the office for the fluid collection in the right leg Endo vein tunnel.  The patient will remain off Plavix because of his significant GI bleed.  81 mg aspirin will continue.   LOS: 3 days    Dahlia Byes 11/12/2021

## 2021-11-12 NOTE — Plan of Care (Signed)

## 2021-11-13 ENCOUNTER — Other Ambulatory Visit (HOSPITAL_BASED_OUTPATIENT_CLINIC_OR_DEPARTMENT_OTHER): Payer: Self-pay

## 2021-11-13 DIAGNOSIS — R9431 Abnormal electrocardiogram [ECG] [EKG]: Secondary | ICD-10-CM

## 2021-11-13 LAB — BASIC METABOLIC PANEL
Anion gap: 5 (ref 5–15)
BUN: 12 mg/dL (ref 8–23)
CO2: 26 mmol/L (ref 22–32)
Calcium: 8.1 mg/dL — ABNORMAL LOW (ref 8.9–10.3)
Chloride: 107 mmol/L (ref 98–111)
Creatinine, Ser: 1 mg/dL (ref 0.61–1.24)
GFR, Estimated: >60 mL/min (ref >60)
Glucose, Bld: 96 mg/dL (ref 70–99)
Potassium: 3.5 mmol/L (ref 3.5–5.1)
Sodium: 138 mmol/L (ref 135–145)

## 2021-11-13 LAB — CBC
HCT: 28.1 % — ABNORMAL LOW (ref 39.0–52.0)
Hemoglobin: 9.4 g/dL — ABNORMAL LOW (ref 13.0–17.0)
MCH: 31 pg (ref 26.0–34.0)
MCHC: 33.5 g/dL (ref 30.0–36.0)
MCV: 92.7 fL (ref 80.0–100.0)
Platelets: 217 10*3/uL (ref 150–400)
RBC: 3.03 MIL/uL — ABNORMAL LOW (ref 4.22–5.81)
RDW: 21.2 % — ABNORMAL HIGH (ref 11.5–15.5)
WBC: 8.3 10*3/uL (ref 4.0–10.5)
nRBC: 0 % (ref 0.0–0.2)

## 2021-11-13 LAB — GLUCOSE, CAPILLARY
Glucose-Capillary: 97 mg/dL (ref 70–99)
Glucose-Capillary: 126 mg/dL — ABNORMAL HIGH (ref 70–99)

## 2021-11-13 MED ORDER — POLYETHYLENE GLYCOL 3350 17 G PO PACK: 17.0000 g | PACK | Freq: Every day | ORAL | 0 refills | Status: DC

## 2021-11-13 MED ORDER — SENNA 8.6 MG PO TABS: 1.0000 | ORAL_TABLET | Freq: Every day | ORAL | 0 refills | Status: DC | PRN

## 2021-11-13 NOTE — TOC Transition Note (Signed)
Transition of Care [facility]) - CM/SW Discharge Note   Patient Details  Name: Jorge Fisher MRN: [id number] Date of Birth: 30-Apr-1956  [facility] CM/SW Contact:  Cyndi Bender, RN Phone Number: 11/13/2021, 2:08 PM   Clinical Narrative:    Patient stable for discharge. Patient doesn't have a PCP. This RNCM offered to find him a PCP but patient states he will find one on his own. Patient states he has transportation home.  No other needs at this time    Final next level of care: Home/Self Care Barriers to Discharge: Barriers Resolved   Patient Goals and CMS Choice Patient states their goals for this hospitalization and ongoing recovery are:: return home      Discharge Placement                       Discharge Plan and Services                                     Social Determinants of Health (SDOH) Interventions     Readmission Risk Interventions Readmission Risk Prevention Plan 11/13/2021 11/01/2021  Post Dischage Appt - Complete  Medication Screening - Complete  Transportation Screening Complete Complete  PCP or Specialist Appt within 5-7 Days Patient refused -  Home Care Screening Complete -  Medication Review (RN CM) Complete -  Some recent data might be hidden

## 2021-11-13 NOTE — Discharge Instructions (Signed)
You were admitted for low blood count. The GI doctor did an EGD to it did not show any active bleed. Please stop taking Plavix. Continue taking Aspirin everyday. Please stop taking Amiodarone as well for now-- you will follow up with the CT surgeon for this as well as the right leg. We also took a picture of your leg, and it did not show any sort of ongoing bleed or sign of infection. Please follow up with your PCP and CT surgeon.

## 2021-11-13 NOTE — Discharge Summary (Signed)
Name: Jorge Fisher MRN: [id number] DOB: 1955-12-24 66 y.o. PCP: Pcp, No  Date of Admission: 11/09/2021  6:22 AM Date of Discharge:  11/13/21 Attending Physician: Dr. Philipp Ovens  DISCHARGE DIAGNOSIS:  Primary Problem: Symptomatic anemia from likely UGIB   Hospital Problems: Principal Problem:   Symptomatic anemia Active Problems:   Ischemic cardiomyopathy   CAD (coronary artery disease)   S/P CABG x 4   Lactic acidosis   GI bleed    DISCHARGE MEDICATIONS:   Allergies as of 11/13/2021       Reactions   Peanuts [peanut Oil] Swelling   cashews        Medication List     STOP taking these medications    amiodarone 200 MG tablet Commonly known as: PACERONE   clopidogrel 75 MG tablet Commonly known as: PLAVIX       TAKE these medications    aspirin EC 81 MG tablet Take 81 mg by mouth daily. Swallow whole.   atorvastatin 80 MG tablet Commonly known as: LIPITOR Take 1 tablet (80 mg total) by mouth at bedtime.   cholecalciferol 1000 units tablet Commonly known as: VITAMIN D Take 1,000 Units by mouth daily.   CINNAMON PO Take 1 tablet by mouth daily.   Ferocon capsule Generic drug: ferrous [id number] C-folic acid Take 1 capsule by mouth 2 (two) times daily after a meal.   Fish Oil 1000 MG Caps Take 1 capsule by mouth 2 (two) times daily.   furosemide 40 MG tablet Commonly known as: LASIX Take 1 tablet (40 mg total) by mouth daily for 3 days.   Garlic 836 MG Tabs Take 1 tablet by mouth daily.   K2 PO Take 1 tablet by mouth daily.   MAGNESIUM PO Take 1 tablet by mouth daily.   metoprolol tartrate 25 MG tablet Commonly known as: LOPRESSOR TAKE ONE-HALF TABLET BY MOUTH TWICE DAILY   polyethylene glycol 17 g packet Commonly known as: MIRALAX / GLYCOLAX Take 17 g by mouth daily. Start taking on: November 14, 2021   potassium chloride SA 20 MEQ tablet Commonly known as: KLOR-CON M Take 1 tablet (20 mEq total) by mouth daily.    senna 8.6 MG Tabs tablet Commonly known as: SENOKOT Take 1 tablet (8.6 mg total) by mouth daily as needed for mild constipation.   vitamin C 1000 MG tablet Take 1,000 mg by mouth daily.               Discharge Care Instructions  (From admission, onward)           Start     Ordered   11/13/21 0000  Discharge wound care:       Comments: Daily wound dressing change   11/13/21 1350            DISPOSITION AND FOLLOW-UP:  Jorge Fisher was discharged from [facility] in stable condition. At the hospital follow up visit please address:  Follow-up Recommendations: Consults: none  Labs: BMP, CBC Studies: EKG  Medications: Stop Plavix, continue Aspirin, Stopped Amiodarone due to prolonged Qtc   Follow-up Appointments:  Follow-up Information     [facility]. Go in 14 day(s).   Why: F/u for post hospital visit at 3/13 at 330 pm        [place] Follow up in 8 day(s).   Why: follw up on 3/7 at 220 pm for cardiology follow up Contact information: [address] [place] [address] [phone]  HOSPITAL COURSE:  Patient Summary: Resolved symptomatic anemia multifactorial from probable UGIB, right lower extremity hematoma, and post op blood loss from his recent CABG Leukocytosis, improved   Patient presented with diaphoresis, paleness and cool to touch. Had a syncopal episode on EMS arrival and found to be hypotensive and tachycardic. In the ER he was found to be hypothermic, hypotensive and tachycardic.  He had another syncopal episode in the ED and was briefly unresponsive. During this episode found to have melena in his stool. Fecal occult positive. Hemoglobin 4.8, improved to 8.1 after 2 units PRBCs. Blood pressures have improved since arrival. Patient denies any blood in his stool or melena.  History of colorectal polyps and hemorrhoids; last colonoscopy in 2010.  Patient was recently started on aspirin and Plavix status post quadruple bypass. I do not suspect he is having bleed or complication from his recent CABG.  No signs of cardiac tamponade on exam or EKG. Lactic acid 7.5>2.2, likely in the setting of hypovolemia and bleed.  I suspect hypothermia is in the setting of hypovolemia secondary to his bleed and leukocytosis may also be reactive. No s/sxs of infectious etiology. Hgb improved from 4.8 (admission) to 8.1 with 2 units PRBCs but then dropped to 7.1 later that evening. Hgb 5.9 the next morning. Improved and then dropped to 6 the next day after 1 unit. Hemoglobin 8.6 after transfusion of 2 units of packed red blood cells. His hemoglobin has remained stable with no ongoing signs of bleeding.  He has had no bowel movements at time of discharge but bowel regimen started. CT of the patient's right lower extremity with likely hematoma in the subcutaneous tissues.  Leukocytosis improved 22.5 -> 19.4 -> 14.2 with fluid resuscitation; suspect this was reactive and not true infection, given afebrile, no systemic s/sxs of  infection, and BC with 1/4 bottles growing GPC with BCID detecting MSSE presumably a contaminant. He will follow up with CT surgery on 3/13 and cardiology on 3/7.  - Trended CBC  - Evaluated by CT surgery; transfuse to maintain Hgb >8.5, and discontinue Plavix indefinitely  - Holding ASA for 3-5 days after EGD, will resume 2/28 - PPI BID x 1 month and then QD - SCDs for VTE prophylaxis  AKI, resolved  Creatinine back to baseline (0.9-1) with IVF and pRBCs.  - Trended BMP - IV fluids - Avoided nephrotoxins    CAD STEMI in 2013  S/p quadruple bypass   Ischemic cardiomyopathy - Holding aspirin after EGD, will resume on 2/28 - Discontinue Plavix indefinitely per CT surgery  - Holding metoprolol 12.5 mg daily in the setting of bleed   Paroxysmal atrial fibrillation Prolonged QTc NSR and rate controlled. New; surrounding his CABG earlier  this month. Was started on Amio and metop.  - Continued amiodarone 200 mg daily - Holding metoprolol in the setting soft blood pressures. - Noticed prolong Qtc 530 on day of discharge. Appears that this is new since starting amiodarone after CABG this month. Spoke with Dr Lawson Fiscal who is fine with continuing his metop and discontinuing his amio, and will follow up with him in clinic on 3/13. He will also follow up with cardiology on 3/7.    Hypertension Patient is normotensive at this time.  -Held antihypertensives during stay d/t soft pressures with GI bleed.    Hyperlipidemia - continued atorvastatin 80 mg daily    DISCHARGE INSTRUCTIONS:   Discharge Instructions     Call MD for:  difficulty breathing, headache or visual  disturbances   Complete by: As directed    Call MD for:  extreme fatigue   Complete by: As directed    Call MD for:  hives   Complete by: As directed    Call MD for:  persistant dizziness or light-headedness   Complete by: As directed    Call MD for:  persistant nausea and vomiting   Complete by: As directed    Call MD for:  redness, tenderness, or signs of infection (pain, swelling, redness, odor or green/yellow discharge around incision site)   Complete by: As directed    Call MD for:  severe uncontrolled pain   Complete by: As directed    Call MD for:  temperature >100.4   Complete by: As directed    Diet - low sodium heart healthy   Complete by: As directed    Discharge wound care:   Complete by: As directed    Daily wound dressing change   Increase activity slowly   Complete by: As directed        SUBJECTIVE:  No acute events overnight   Patient was seen at bedside during rounds today. Pt reports feeling well.  Has not had another bowel movement. Denies CP, SHOB, fevers, and chills. No blood loss that he knows of. He does not report much pain in his right leg.  Feels like the swelling in his right lower extremity is improving   Discharge  Vitals:   BP 115/72 (BP Location: Right Arm)    Pulse 75    Temp 98 F (36.7 C) (Oral)    Resp 16    Ht 5\' 10"  (1.778 m)    SpO2 95%    BMI 24.97 kg/m   OBJECTIVE:  General: alert, and in no acute distress HEENT: Normocephalic, atraumatic, EOM intact, conjunctiva normal CV: RRR, no murmurs rubs or gallops. Sternotomy incision is healing well. Asymmetric lower extremity edema (R>L) which he says occurred after his vein harvesting; sites are healing well. Bandages with clear discharge with no evidence of active bleeding.  Right lower extremity compartments soft and nontender to palpation.   Pulm: Clear to auscultation bilaterally, normal work of breathing Abdomen: Soft, nondistened, bowel sounds present, no tenderness to palpation Skin: Warm and dry, Neuro: Alert and oriented x3, follows commands   Pertinent Labs, Studies, and Procedures:  CBC Latest Ref Rng & Units 11/13/2021 11/12/2021 11/11/2021  WBC 4.0 - 10.5 K/uL 8.3 10.4 10.8(H)  Hemoglobin 13.0 - 17.0 g/dL 9.4(L) 8.6(L) 8.5(L)  Hematocrit 39.0 - 52.0 % 28.1(L) 25.5(L) 26.7(L)  Platelets 150 - 400 K/uL 217 183 192    CMP Latest Ref Rng & Units 11/13/2021 11/12/2021 11/11/2021  Glucose 70 - 99 mg/dL 96 93 112(H)  BUN 8 - 23 mg/dL 12 19 26(H)  Creatinine 0.61 - 1.24 mg/dL 1.00 1.19 1.23  Sodium 135 - 145 mmol/L 138 137 138  Potassium 3.5 - 5.1 mmol/L 3.5 3.8 3.6  Chloride 98 - 111 mmol/L 107 108 110  CO2 22 - 32 mmol/L 26 24 23   Calcium 8.9 - 10.3 mg/dL 8.1(L) 7.8(L) 7.7(L)  Total Protein 6.5 - 8.1 g/dL - - -  Total Bilirubin 0.3 - 1.2 mg/dL - - -  Alkaline Phos 38 - 126 U/L - - -  AST 15 - 41 U/L - - -  ALT 0 - 44 U/L - - -    DG Chest Port 1 View  Result Date: 11/09/2021 CLINICAL DATA:  A 66 year old male presents for evaluation  of questionable sepsis. EXAM: PORTABLE CHEST 1 VIEW COMPARISON:  October 31, 2021. FINDINGS: EKG leads project over the chest. Median sternotomy and signs of CABG as before. Cardiomediastinal contours  and hilar structures are stable. Lungs without signs of lobar consolidation. Graded opacity in the LEFT chest. Limited portable radiograph omits a portion of the inferior chest on the LEFT. No asymmetry to visualized portions of the LEFT and RIGHT hemidiaphragm. On limited assessment there is no acute skeletal process. IMPRESSION: Question small LEFT effusion.  No lobar consolidation. Postoperative changes of median sternotomy and CABG as before. Electronically Signed   By: Zetta Bills M.D.   On: 11/09/2021 07:59   [place] RT LOWER EXTREM LTD SOFT TISSUE NON VASCULAR  Result Date: 11/10/2021 CLINICAL DATA:  Hematoma EXAM: ULTRASOUND RIGHT LOWER EXTREMITY LIMITED TECHNIQUE: Ultrasound examination of the lower extremity soft tissues was performed in the area of clinical concern. COMPARISON:  None. FINDINGS: Complex fluid collection is seen in the right calf in the area concern measuring 4.8 x 2.9 x 0.9 cm, most compatible with hematoma. IMPRESSION: Complex fluid collection in the right calf proximally in the area concern concerning for hematoma. Electronically Signed   By: Rolm Baptise M.D.   On: 11/10/2021 20:20      Lajean Manes, MD Internal Medicine Resident, PGY-1 Pager: [phone]

## 2021-11-14 LAB — CALCIUM, IONIZED: Calcium, Ionized, Serum: 5.2 mg/dL (ref 4.5–5.6)

## 2021-11-14 LAB — CULTURE, BLOOD (ROUTINE X 2): Culture: NO GROWTH

## 2021-11-14 LAB — SURGICAL PATHOLOGY

## 2021-11-20 NOTE — Progress Notes (Signed)
Office Visit    Patient Name: Jorge Fisher Date of Encounter: 11/21/2021  Primary Care Provider:  Pcp, No Primary Cardiologist:  Jorge Grooms, MD  [facility] follow-up post CABG  History of Present Illness    Jorge Fisher is a 66 y.o. male with PMH of CAD s/p STEMI 2013 with PCI to LAD, staged PCI to LCx, CTO RCA, HTN, HLD.  Presented 10/21/21 with an NSTEMI,Echo completed with EF:40-45%,RWMA, and mild LVH. cath performed and revealed left main three-vessel disease. CVTS was consulted and he underwent CABG x4 on 5/0/35 without complication.He was readmitted to the ED via EMS on 2/23 with due syncope with bradycardia due to to bleeding peptic ulcer. Treated with crystalloids and blood transfusion. Plavix and amiodarone stopped due to long QTc and GI bleeding. Aspirin and metoprolol were started.   Jorge Fisher presents today alone for follow-up.  Sincelast being seen in our clinic the patient reports doing well. He denies chest pain, palpitations, dyspnea, PND, orthopnea, nausea, vomiting, dizziness, syncope, edema, weight gain, or early satiety.  He denies any hematuria, blood in stool.  He is sternotomy incision is open to air and he denies any oozing or signs of infection.  Patient states that he is feeling better but still is weak and has not began physical activity.  He plans to do his own cardiac rehab at home using treadmill and free weights.   Past Medical History    Past Medical History:  Diagnosis Date   CAD (coronary artery disease)    a) diagnosed with acute anterior STEMI with totally occluded LAD s/p PTCA/DES x 2 to prox and mid LAD 02/08/12, staged PTCA/DES to Cx 02/12/12, diagonal dz for med rx, & chronically occ RCA with collaterals.    Colorectal polyps    Hemorrhoids    Hx of CABG 10/26/2021   Bypass x4   Hypertension    Mild HTN in past, but more recently borderline low blood pressures   Ischemic cardiomyopathy    Improved - EF 25-30% by cath  02/08/12 then 50-55% by echo 02/11/12   Past Surgical History:  Procedure Laterality Date   BIOPSY  11/10/2021   Procedure: BIOPSY;  Surgeon: Carol Ada, MD;  Location: [place];  Service: Gastroenterology;;   CORONARY ARTERY BYPASS GRAFT N/A 10/26/2021   Procedure: CORONARY ARTERY BYPASS GRAFTING (CABG) X FOUR , USING LEFT INTERNAL MAMMARY ARTERY AND RIGHT LEG GREATER SAPHENOUS VEIN HARVESTED ENDOSCOPICALLY;  Surgeon: Dahlia Byes, MD;  Location: [place];  Service: Open Heart Surgery;  Laterality: N/A;   ENDOVEIN HARVEST OF GREATER SAPHENOUS VEIN Right 10/26/2021   Procedure: ENDOVEIN HARVEST OF GREATER SAPHENOUS VEIN;  Surgeon: Dahlia Byes, MD;  Location: [place];  Service: Open Heart Surgery;  Laterality: Right;   ESOPHAGOGASTRODUODENOSCOPY (EGD) WITH PROPOFOL N/A 11/10/2021   Procedure: ESOPHAGOGASTRODUODENOSCOPY (EGD) WITH PROPOFOL;  Surgeon: Carol Ada, MD;  Location: [place];  Service: Gastroenterology;  Laterality: N/A;   LEFT HEART CATH AND CORONARY ANGIOGRAPHY N/A 10/23/2021   Procedure: LEFT HEART CATH AND CORONARY ANGIOGRAPHY;  Surgeon: Lorretta Harp, MD;  Location: [place] CV LAB;  Service: Cardiovascular;  Laterality: N/A;   LEFT HEART CATHETERIZATION WITH CORONARY ANGIOGRAM N/A 02/08/2012   Procedure: LEFT HEART CATHETERIZATION WITH CORONARY ANGIOGRAM;  Surgeon: Burnell Blanks, MD;  Location: [facility] CATH LAB;  Service: Cardiovascular;  Laterality: N/A;   PERCUTANEOUS CORONARY STENT INTERVENTION (PCI-S) N/A 02/08/2012   Procedure: PERCUTANEOUS CORONARY STENT INTERVENTION (PCI-S);  Surgeon:  Burnell Blanks, MD;  Location: [facility] CATH LAB;  Service: Cardiovascular;  Laterality: N/A;   PERCUTANEOUS CORONARY STENT INTERVENTION (PCI-S) N/A 02/12/2012   Procedure: PERCUTANEOUS CORONARY STENT INTERVENTION (PCI-S);  Surgeon: Sherren Mocha, MD;  Location: [facility] CATH LAB;  Service: Cardiovascular;  Laterality: N/A;   TEE WITHOUT CARDIOVERSION N/A 10/26/2021   Procedure:  TRANSESOPHAGEAL ECHOCARDIOGRAM (TEE);  Surgeon: Dahlia Byes, MD;  Location: [place];  Service: Open Heart Surgery;  Laterality: N/A;    Allergies  Allergies  Allergen Reactions   Peanuts [Peanut Oil] Swelling    cashews    Home Medications    Current Outpatient Medications  Medication Sig Dispense Refill   Ascorbic Acid (VITAMIN C) 1000 MG tablet Take 1,000 mg by mouth daily.       aspirin EC 81 MG tablet Take 81 mg by mouth daily. Swallow whole.     atorvastatin (LIPITOR) 80 MG tablet Take 1 tablet (80 mg total) by mouth at bedtime. (Patient not taking: Reported on 10/22/2021) 30 tablet 6   cholecalciferol (VITAMIN D) 1000 UNITS tablet Take 1,000 Units by mouth daily.     CINNAMON PO Take 1 tablet by mouth daily.     ferrous [id number] C-folic acid (TRINSICON / FOLTRIN) capsule Take 1 capsule by mouth 2 (two) times daily after a meal. (Patient not taking: Reported on 11/09/2021) 60 capsule 0   furosemide (LASIX) 40 MG tablet Take 1 tablet (40 mg total) by mouth daily for 3 days. 3 tablet 0   Garlic 932 MG TABS Take 1 tablet by mouth daily.     MAGNESIUM PO Take 1 tablet by mouth daily.     Menaquinone-7 (K2 PO) Take 1 tablet by mouth daily.     metoprolol tartrate (LOPRESSOR) 25 MG tablet TAKE ONE-HALF TABLET BY MOUTH TWICE DAILY (Patient not taking: Reported on 11/09/2021) 30 tablet 4   Omega-3 Fatty Acids (FISH OIL) 1000 MG CAPS Take 1 capsule by mouth 2 (two) times daily.     polyethylene glycol (MIRALAX / GLYCOLAX) 17 g packet Take 17 g by mouth daily. 14 each 0   potassium chloride SA (KLOR-CON M) 20 MEQ tablet Take 1 tablet (20 mEq total) by mouth daily. (Patient not taking: Reported on 11/09/2021) 3 tablet 0   senna (SENOKOT) 8.6 MG TABS tablet Take 1 tablet (8.6 mg total) by mouth daily as needed for mild constipation. 30 tablet 0   No current facility-administered medications for this visit.     Review of Systems  All other systems reviewed and are otherwise  negative except as noted above.  Physical Exam    VS:  Vitals:   11/21/21 1417  BP: 118/70  Pulse: 82  SpO2: 98%     GEN: Well nourished, well developed, in no acute distress. Neck: Supple, no JVD, carotid bruits, or masses. Cardiac: RRR, no murmurs, rubs, or gallops. No clubbing, cyanosis, edema.  Radials/PT 2+ and equal bilaterally.  Respiratory:  Respirations regular and unlabored, clear to auscultation bilaterally. MS: no deformity or atrophy. Skin: warm and dry, no rash. Neuro:  Strength and sensation are intact. Psych: Normal affect.  Accessory Clinical Findings    ECG personally reviewed by me today -sinus rhythm with RBBB rate of 83- no acute changes.  Lab Results  Component Value Date   WBC 8.3 11/13/2021   HGB 9.4 (L) 11/13/2021   HCT 28.1 (L) 11/13/2021   MCV 92.7 11/13/2021   PLT 217 11/13/2021   Lab Results  Component Value Date  CREATININE 1.00 11/13/2021   BUN 12 11/13/2021   NA 138 11/13/2021   K 3.5 11/13/2021   CL 107 11/13/2021   CO2 26 11/13/2021   Lab Results  Component Value Date   ALT 18 11/09/2021   AST 28 11/09/2021   ALKPHOS 44 11/09/2021   BILITOT 0.6 11/09/2021   Lab Results  Component Value Date   CHOL 168 10/21/2021   HDL 49 10/21/2021   LDLCALC 111 (H) 10/21/2021   TRIG 40 10/21/2021   CHOLHDL 3.4 10/21/2021    Lab Results  Component Value Date   HGBA1C 5.6 10/21/2021    Assessment & Plan    1.  S/p CABG: -Presented 10/21/21 with an NSTEMI, left main three-vessel disease. -CVTS was consulted and he underwent CABGx4 on 10/26/21 and was discharged and readmitted on 2/23 with bleeding ulcer requiring transfusion. Plavix discontinued by CVTS and ASA 81 mg to allow for ulcer to heal.  -Sternotomy with no signs and symptoms of infection -Cardiac rehab discussed and patient has elected to do his own rehab at home.  I did advise to check blood pressure and heart rate pre and post exercise  2.  NSTEMI/CAD: -NSTEMI 10/23/2021 with  three-vessel disease,  -EF was 40 to 45% per Echo 2/22 -Restart metoprolol 25 mg twice daily and continue ASA 81 mg -Continue atorvastatin 80 mg  3.  Paroxysmal atrial fibrillation: -Rate controlled on metoprolol currently maintaining sinus rhythm -Amiodarone discontinued at discharge due to long QT -ASA 81 mg  4.  Hypertension: -Well-controlled today 118/70  -Continue metoprolol 25 mg  5.  Hyperlipidemia: -Last LDL 111 on 2/4 not at goal of  <70 -Continue atorvastatin 80 mg  Disposition: Follow-up with APP 3 to 4 weeks      Medication Adjustments/Labs and Tests Ordered: Current medicines are reviewed at length with the patient today.  Concerns regarding medicines are outlined above.  Tests Ordered: No orders of the defined types were placed in this encounter.  Medication Changes: No orders of the defined types were placed in this encounter.    Mable Fill, Marissa Nestle, NP 11/21/2021, 12:24 PM

## 2021-11-21 ENCOUNTER — Encounter: Payer: Self-pay | Admitting: Nurse Practitioner

## 2021-11-21 ENCOUNTER — Ambulatory Visit (INDEPENDENT_AMBULATORY_CARE_PROVIDER_SITE_OTHER): Payer: 59 | Admitting: Nurse Practitioner

## 2021-11-21 ENCOUNTER — Other Ambulatory Visit: Payer: Self-pay

## 2021-11-21 VITALS — BP 118/70 | HR 82 | Ht 70.0 in | Wt 172.0 lb

## 2021-11-21 DIAGNOSIS — I214 Non-ST elevation (NSTEMI) myocardial infarction: Secondary | ICD-10-CM

## 2021-11-21 DIAGNOSIS — I480 Paroxysmal atrial fibrillation: Secondary | ICD-10-CM

## 2021-11-21 DIAGNOSIS — I10 Essential (primary) hypertension: Secondary | ICD-10-CM

## 2021-11-21 DIAGNOSIS — E785 Hyperlipidemia, unspecified: Secondary | ICD-10-CM

## 2021-11-21 DIAGNOSIS — Z951 Presence of aortocoronary bypass graft: Secondary | ICD-10-CM | POA: Diagnosis not present

## 2021-11-21 LAB — ECHO INTRAOPERATIVE TEE
AR max vel: 2.22 cm2
AV Area VTI: 1.74 cm2
AV Area mean vel: 2.04 cm2
AV Mean grad: 2 mmHg
AV Peak grad: 3.6 mmHg
Ao pk vel: 0.95 m/s
Height: 70 in
MV VTI: 2.37 cm2
Weight: 2753.6 oz

## 2021-11-21 MED ORDER — METOPROLOL TARTRATE 25 MG PO TABS: 12.5000 mg | ORAL_TABLET | Freq: Two times a day (BID) | ORAL | 3 refills | Status: DC

## 2021-11-21 NOTE — Patient Instructions (Signed)
Medication Instructions:  ? ?START Metoprolol one half tablet by mouth ( 12.5 mg) twice daily.  ? ?*If you need a refill on your cardiac medications before your next appointment, please call your pharmacy* ? ? ? ?Follow-Up: ?At [facility], you and your health needs are our priority.  As part of our continuing mission to provide you with exceptional heart care, we have created designated Provider Care Teams.  These Care Teams include your primary Cardiologist (physician) and Advanced Practice Providers (APPs -  Physician Assistants and Nurse Practitioners) who all work together to provide you with the care you need, when you need it. ? ?We recommend signing up for the patient portal called "MyChart".  Sign up information is provided on this After Visit Summary.  MyChart is used to connect with patients for Virtual Visits (Telemedicine).  Patients are able to view lab/test results, encounter notes, upcoming appointments, etc.  Non-urgent messages can be sent to your provider as well.   ?To learn more about what you can do with MyChart, go to [email or web address].   ? ?Your next appointment:   ?3 week(s) ? ?The format for your next appointment:   ?In Person ? ?Provider:   ?Ambrose Pancoast, NP       ? ? ?Other Instructions ? ?Mediterranean Diet ?A Mediterranean diet refers to food and lifestyle choices that are based on the traditions of countries located on the [employer]. It focuses on eating more fruits, vegetables, whole grains, beans, nuts, seeds, and heart-healthy fats, and eating less dairy, meat, eggs, and processed foods with added sugar, salt, and fat. This way of eating has been shown to help prevent certain conditions and improve outcomes for people who have chronic diseases, like kidney disease and heart disease. ?What are tips for following this plan? ?Reading food labels ?Check the serving size of packaged foods. For foods such as rice and pasta, the serving size refers to the amount of cooked  product, not dry. ?Check the total fat in packaged foods. Avoid foods that have saturated fat or trans fats. ?Check the ingredient list for added sugars, such as corn syrup. ?Shopping ? ?Buy a variety of foods that offer a balanced diet, including: ?Fresh fruits and vegetables (produce). ?Grains, beans, nuts, and seeds. Some of these may be available in unpackaged forms or large amounts (in bulk). ?Fresh seafood. ?Poultry and eggs. ?Low-fat dairy products. ?Buy whole ingredients instead of prepackaged foods. ?Buy fresh fruits and vegetables in-season from local farmers markets. ?Buy plain frozen fruits and vegetables. ?If you do not have access to quality fresh seafood, buy precooked frozen shrimp or canned fish, such as tuna, salmon, or sardines. ?Stock your pantry so you always have certain foods on hand, such as olive oil, canned tuna, canned tomatoes, rice, pasta, and beans. ?Cooking ?Cook foods with extra-virgin olive oil instead of using butter or other vegetable oils. ?Have meat as a side dish, and have vegetables or grains as your main dish. This means having meat in small portions or adding small amounts of meat to foods like pasta or stew. ?Use beans or vegetables instead of meat in common dishes like chili or lasagna. ?Experiment with different cooking methods. Try roasting, broiling, steaming, and saut?ing vegetables. ?Add frozen vegetables to soups, stews, pasta, or rice. ?Add nuts or seeds for added healthy fats and plant protein at each meal. You can add these to yogurt, salads, or vegetable dishes. ?Marinate fish or vegetables using olive oil, lemon juice, garlic, and fresh herbs. ?  Meal planning ?Plan to eat one vegetarian meal one day each week. Try to work up to two vegetarian meals, if possible. ?Eat seafood two or more times a week. ?Have healthy snacks readily available, such as: ?Vegetable sticks with hummus. ?[place] yogurt. ?Fruit and nut trail mix. ?Eat balanced meals throughout the week. This  includes: ?Fruit: 2-3 servings a day. ?Vegetables: 4-5 servings a day. ?Low-fat dairy: 2 servings a day. ?Fish, poultry, or lean meat: 1 serving a day. ?Beans and legumes: 2 or more servings a week. ?Nuts and seeds: 1-2 servings a day. ?Whole grains: 6-8 servings a day. ?Extra-virgin olive oil: 3-4 servings a day. ?Limit red meat and sweets to only a few servings a month. ?Lifestyle ? ?Cook and eat meals together with your family, when possible. ?Drink enough fluid to keep your urine pale yellow. ?Be physically active every day. This includes: ?Aerobic exercise like running or swimming. ?Leisure activities like gardening, walking, or housework. ?Get 7-8 hours of sleep each night. ?If recommended by your health care provider, drink red wine in moderation. This means 1 glass a day for nonpregnant women and 2 glasses a day for men. A glass of wine equals 5 oz (150 mL). ?What foods should I eat? ?Fruits ?Apples. Apricots. Avocado. Berries. Bananas. Cherries. Dates. Figs. Grapes. Lemons. Melon. Oranges. Peaches. Plums. Pomegranate. ?Vegetables ?Artichokes. Beets. Broccoli. Cabbage. Carrots. Eggplant. Green beans. Chard. Kale. Spinach. Onions. Leeks. Peas. Squash. Tomatoes. Peppers. Radishes. ?Grains ?Whole-grain pasta. Brown rice. Bulgur wheat. Polenta. Couscous. Whole-wheat bread. [place]. ?Meats and other proteins ?Beans. Almonds. Sunflower seeds. Pine nuts. Peanuts. [place]. Salmon. Scallops. Shrimp. [place]. Tilapia. Clams. Oysters. Eggs. Poultry without skin. ?Dairy ?Low-fat milk. Cheese. Greek yogurt. ?Fats and oils ?Extra-virgin olive oil. Avocado oil. Grapeseed oil. ?Beverages ?Water. Red wine. Herbal tea. ?Sweets and desserts ?Greek yogurt with honey. Baked apples. Poached pears. Trail mix. ?Seasonings and condiments ?Bas[place]. Cilantro. Coriander. Cumin. Mint. Parsley. Sage. Rosemary. Tarragon. Garlic. Oregano. Thyme. Pepper. Balsamic vinegar. Tahini. Hummus. Tomato sauce. Olives. Mushrooms. ?The items listed  above may not be a complete list of foods and beverages you can eat. Contact a dietitian for more information. ?What foods should I limit? ?This is a list of foods that should be eaten rarely or only on special occasions. ?Fruits ?Fruit canned in syrup. ?Vegetables ?Deep-fried potatoes (french fries). ?Grains ?Prepackaged pasta or rice dishes. Prepackaged cereal with added sugar. Prepackaged snacks with added sugar. ?Meats and other proteins ?Beef. Pork. Lamb. Poultry with skin. Hot dogs. [place] Sal[place]. ?Dairy ?Ice cream. Sour cream. Whole milk. ?Fats and oils ?Butter. Canola oil. Vegetable oil. Beef fat (tallow). Lard. ?Beverages ?Juice. Sugar-sweetened soft drinks. Beer. Liquor and spirits. ?Sweets and desserts ?Cookies. Cakes. Pies. Candy. ?Seasonings and condiments ?Mayonnaise. Pre-made sauces and marinades. ?The items listed above may not be a complete list of foods and beverages you should limit. Contact a dietitian for more information. ?Summary ?The Mediterranean diet includes both food and lifestyle choices. ?Eat a variety of fresh fruits and vegetables, beans, nuts, seeds, and whole grains. ?Limit the amount of red meat and sweets that you eat. ?If recommended by your health care provider, drink red wine in moderation. This means 1 glass a day for nonpregnant women and 2 glasses a day for men. A glass of wine equals 5 oz (150 mL). ?This information is not intended to replace advice given to you by your health care provider. Make sure you discuss any questions you have with your health care provider. ?Document Revised: 10/09/2019 Document Reviewed: 08/06/2019 ?  Elsevier Patient Education ? [place]. ?  ?

## 2021-11-27 ENCOUNTER — Ambulatory Visit
Admission: RE | Admit: 2021-11-27 | Discharge: 2021-11-27 | Disposition: A | Discharge status: Home / Self Care | Payer: 59 | Source: Ambulatory Visit | Attending: Cardiothoracic Surgery | Admitting: Cardiothoracic Surgery

## 2021-11-27 ENCOUNTER — Other Ambulatory Visit: Payer: Self-pay

## 2021-11-27 ENCOUNTER — Encounter: Payer: Self-pay | Admitting: Cardiothoracic Surgery

## 2021-11-27 ENCOUNTER — Other Ambulatory Visit: Payer: Self-pay | Admitting: Cardiothoracic Surgery

## 2021-11-27 ENCOUNTER — Ambulatory Visit (INDEPENDENT_AMBULATORY_CARE_PROVIDER_SITE_OTHER): Payer: Self-pay | Admitting: Cardiothoracic Surgery

## 2021-11-27 VITALS — BP 110/67 | HR 78 | Resp 18 | Ht 70.0 in | Wt 172.6 lb

## 2021-11-27 DIAGNOSIS — Z951 Presence of aortocoronary bypass graft: Secondary | ICD-10-CM

## 2021-11-27 NOTE — Progress Notes (Signed)
@  [email or web address]@ '@[email or web address]'$ @ ?Subjective: ? ?The patient returns for 6 weeks postop follow-up after CABG x4.  Patient has mild-moderate LV dysfunction.  He developed atrial fibrillation postoperatively and was discharged home on amiodarone.  He had acute coronary syndrome preoperatively and was discharged home on Plavix.  He was readmitted with symptomatic anemia from GI bleeding and required transfusions.  His QT interval was prolonged and the amiodarone was stopped.  His Plavix was discontinued as well.  He underwent upper endoscopy and antipeptic therapy.  His hematocrit is now stable and the GI bleeding has stopped.  He is maintaining sinus rhythm on metoprolol 12.5 twice daily.  He takes aspirin 81 mg daily. ? ?Overall he is feeling much better and stronger.  He is ready to drive and start doing routine activities. ? ?I reviewed the chest x-ray performed today which shows clear lung fields, sternal wires intact, stable cardiac silhouette.  No pleural effusions ? ?Objective: ?Blood pressure 110/67, pulse 78, resp. rate 18, height '5\' 10"'$  (1.778 m), weight 172 lb 9.6 oz (78.3 kg), SpO2 99 %.  ? ? ?  ?   Exam ? ?  General- alert and comfortable ?   Neck- no JVD, no cervical adenopathy palpable, no carotid bruit ?  Lungs- clear without rales, wheezes.  Sternal vision clean   ?and dry. ?  Cor- regular rate and rhythm, no murmur , gallop ?  Abdomen- soft, non-tender ?  Extremities - warm, non-tender, minimal edema ?  Neuro- oriented, appropriate, no focal weakness ? ?Labs ?Portable chest x-ray image personally reviewed and discussed with patient results noted above. ? ? ? ? ?Assessment/Plan: ? ?Patient doing well now 6 weeks after CABG.  Upper GI bleeding has resolved.  Continue current medications.  These were discussed in detail. ?Ready to start driving and may lift up to 10-15 pounds. ?He will return for final postoperative visit in about 6 weeks. ?The patient stated he will need a return to work note-he works  in [employer]. ? ? ?Dahlia Byes ?11/27/2021 ? ? ?

## 2021-12-11 NOTE — Progress Notes (Signed)
? ? ?Office Visit  ?  ?Patient Name: Jorge Fisher ?Date of Encounter: 12/11/2021 ? ?Primary Care Provider:  Pcp, No ?Primary Cardiologist:  Larae Grooms, MD ?Primary Electrophysiologist: None ?Chief Complaint  ?  ?2-week follow-up post CABG ? ? Patient Profile: ?CAD>s/p STEMI 2013 with PCI to LAD, staged PCI to LCx, CTO RCA, CABG x4 2/23 ?HTN ?ICM: EF 25-30% 5/13 then increase to 50-55% ? ? ? ? Recent Studies: ?2D echo 01/2012: EF 50-55% mild hypokinesis, wall motion abnormality, mild hypokinesis of apical wall ?2D echo 10/2021: EF 40-45% mild LVH, trivial TV, MV regurg ?LHC 10/2021: Three-vessel disease ?U/S: 10/2021: No evidence of DVT, saphenous vein graft leg ? ?History of Present Illness  ? ?Jorge Fisher is a 66 y.o. male with PMH of CAD s/p STEMI 2013 with PCI to LAD, staged PCI to LCx, CTO RCA, HTN, HLD.  Presented 10/21/21 with an NSTEMI,Echo completed with EF:40-45%,RWMA, and mild LVH. cath performed and revealed left main three-vessel disease. CVTS was consulted and he underwent CABG x4 on 04/25/20 without complication. He was readmitted to the ED via EMS on 2/23 with due syncope with bradycardia due to to bleeding peptic ulcer. Treated with crystalloids and blood transfusion. Plavix and amiodarone stopped due to long QTc and GI bleeding. Aspirin and metoprolol were started. ? ?Jorge Fisher presented 3/7 for post CABG follow-up.  During visit patient stated he was feeling better still weak and had not began physical activity at that point.  He deferred hospital cardiac rehab and elected to complete his own rehab at home. Recently had follow-up with Dr. Darcey Nora on 11/27/21 and was maintaining sinus rhythm. He was feeling stronger and cleared to lift 10-15 pounds. ? ?Today Jorge Fisher presents for 3-week follow-up.  He reports that he is doing better and feeling stronger today.  B/P today was 92/60 and recheck was 94/64.  He is asymptomatic and denies any tiredness or dizziness. Completed follow-up with Dr.  Darcey Nora on 11/27/21 and was maintaining sinus rhythm. He was feeling stronger and was cleared to lift 10-15 pound and resume driving. He denies chest pain, palpitations, dyspnea, PND, orthopnea, nausea, vomiting, dizziness, syncope, edema, weight gain, or early satiety.  He is looking forward to returning to work and will be planning to retire later this year. ? ? ? ?Past Medical History  ?  ?Past Medical History:  ?Diagnosis Date  ? CAD (coronary artery disease)   ? a) diagnosed with acute anterior STEMI with totally occluded LAD s/p PTCA/DES x 2 to prox and mid LAD 02/08/12, staged PTCA/DES to Cx 02/12/12, diagonal dz for med rx, & chronically occ RCA with collaterals.   ? Colorectal polyps   ? Hemorrhoids   ? Hx of CABG 10/26/2021  ? Bypass x4  ? Hypertension   ? Mild HTN in past, but more recently borderline low blood pressures  ? Ischemic cardiomyopathy   ? Improved - EF 25-30% by cath 02/08/12 then 50-55% by echo 02/11/12  ? ?Past Surgical History:  ?Procedure Laterality Date  ? BIOPSY  11/10/2021  ? Procedure: BIOPSY;  Surgeon: Carol Ada, MD;  Location: [place];  Service: Gastroenterology;;  ? CORONARY ARTERY BYPASS GRAFT N/A 10/26/2021  ? Procedure: CORONARY ARTERY BYPASS GRAFTING (CABG) X FOUR , USING LEFT INTERNAL MAMMARY ARTERY AND RIGHT LEG GREATER SAPHENOUS VEIN HARVESTED ENDOSCOPICALLY;  Surgeon: Dahlia Byes, MD;  Location: [place];  Service: Open Heart Surgery;  Laterality: N/A;  ? ENDOVEIN HARVEST OF GREATER SAPHENOUS VEIN Right 10/26/2021  ?  Procedure: ENDOVEIN HARVEST OF GREATER SAPHENOUS VEIN;  Surgeon: Dahlia Byes, MD;  Location: [place];  Service: Open Heart Surgery;  Laterality: Right;  ? ESOPHAGOGASTRODUODENOSCOPY (EGD) WITH PROPOFOL N/A 11/10/2021  ? Procedure: ESOPHAGOGASTRODUODENOSCOPY (EGD) WITH PROPOFOL;  Surgeon: Carol Ada, MD;  Location: [place];  Service: Gastroenterology;  Laterality: N/A;  ? LEFT HEART CATH AND CORONARY ANGIOGRAPHY N/A 10/23/2021  ? Procedure: LEFT HEART  CATH AND CORONARY ANGIOGRAPHY;  Surgeon: Lorretta Harp, MD;  Location: [place] [facility];  Service: Cardiovascular;  Laterality: N/A;  ? LEFT HEART CATHETERIZATION WITH CORONARY ANGIOGRAM N/A 02/08/2012  ? Procedure: LEFT HEART CATHETERIZATION WITH CORONARY ANGIOGRAM;  Surgeon: Burnell Blanks, MD;  Location: [facility] CATH LAB;  Service: Cardiovascular;  Laterality: N/A;  ? PERCUTANEOUS CORONARY STENT INTERVENTION (PCI-S) N/A 02/08/2012  ? Procedure: PERCUTANEOUS CORONARY STENT INTERVENTION (PCI-S);  Surgeon: Burnell Blanks, MD;  Location: [facility] CATH LAB;  Service: Cardiovascular;  Laterality: N/A;  ? PERCUTANEOUS CORONARY STENT INTERVENTION (PCI-S) N/A 02/12/2012  ? Procedure: PERCUTANEOUS CORONARY STENT INTERVENTION (PCI-S);  Surgeon: Sherren Mocha, MD;  Location: [facility] CATH LAB;  Service: Cardiovascular;  Laterality: N/A;  ? TEE WITHOUT CARDIOVERSION N/A 10/26/2021  ? Procedure: TRANSESOPHAGEAL ECHOCARDIOGRAM (TEE);  Surgeon: Dahlia Byes, MD;  Location: [place];  Service: Open Heart Surgery;  Laterality: N/A;  ? ? ?Allergies ? ?Allergies  ?Allergen Reactions  ? Peanuts [Peanut Oil] Swelling  ?  cashews  ? ? ?Home Medications  ?  ?Current Outpatient Medications  ?Medication Sig Dispense Refill  ? Ascorbic Acid (VITAMIN C) 1000 MG tablet Take 1,000 mg by mouth daily.      ? aspirin EC 81 MG tablet Take 81 mg by mouth daily. Swallow whole.    ? cholecalciferol (VITAMIN D) 1000 UNITS tablet Take 1,000 Units by mouth daily.    ? CINNAMON PO Take 1 tablet by mouth daily.    ? ferrous [id number] C-folic acid (TRINSICON / FOLTRIN) capsule Take 1 capsule by mouth 2 (two) times daily after a meal. 60 capsule 0  ? Garlic 431 MG TABS Take 1 tablet by mouth daily.    ? MAGNESIUM PO Take 1 tablet by mouth daily.    ? Menaquinone-7 (K2 PO) Take 1 tablet by mouth daily.    ? metoprolol tartrate (LOPRESSOR) 25 MG tablet Take 0.5 tablets (12.5 mg total) by mouth 2 (two) times daily. 90 tablet 3  ? Omega-3 Fatty  Acids (FISH OIL) 1000 MG CAPS Take 1 capsule by mouth daily.    ? polyethylene glycol (MIRALAX / GLYCOLAX) 17 g packet Take 17 g by mouth as needed for mild constipation.    ? ?No current facility-administered medications for this visit.  ?  ? ?Review of Systems  ?Please see the history of present illness.    ? ?All other systems reviewed and are otherwise negative except as noted above. ? ?Physical Exam  ?  ?Wt Readings from Last 3 Encounters:  ?11/27/21 172 lb 9.6 oz (78.3 kg)  ?11/21/21 172 lb (78 kg)  ?11/06/21 174 lb (78.9 kg)  ? ?VQ:MGQQP were no vitals filed for this visit.,There is no height or weight on file to calculate BMI. ? ?Constitutional:   ?   Appearance: Healthy appearance. Not in distress.  ?Neck:  ?   Vascular: JVD normal.  ?Pulmonary:  ?   Effort: Pulmonary effort is normal.  ?   Breath sounds: No wheezing. No rales.  ?Cardiovascular:  ?   Normal rate. Regular rhythm. Normal S1. Normal  S2.   ?   Murmurs: There is no murmur.  ?Edema: ?   Peripheral edema absent.  ?Abdominal:  ?   Palpations: Abdomen is soft. There is no hepatomegaly.  ?Skin: ?   General: Skin is warm and dry.  ?Neurological:  ?   General: No focal deficit present.  ?   Mental Status: Alert and oriented to person, place and time.  ?   Cranial Nerves: Cranial nerves are intact.  ?EKG/LABS/Other Studies Reviewed  ?  ?ECG personally reviewed by me today -none completed today  ? ?Lab Results  ?Component Value Date  ? CREATININE 1.00 11/13/2021  ? BUN 12 11/13/2021  ? NA 138 11/13/2021  ? K 3.5 11/13/2021  ? CL 107 11/13/2021  ? CO2 26 11/13/2021  ? ?Lab Results  ?Component Value Date  ? ALT 18 11/09/2021  ? AST 28 11/09/2021  ? ALKPHOS 44 11/09/2021  ? BILITOT 0.6 11/09/2021  ? ?Lab Results  ?Component Value Date  ? CHOL 168 10/21/2021  ? HDL 49 10/21/2021  ? LDLCALC 111 (H) 10/21/2021  ? TRIG 40 10/21/2021  ? CHOLHDL 3.4 10/21/2021  ?  ?Lab Results  ?Component Value Date  ? HGBA1C 5.6 10/21/2021  ? ? ?Assessment & Plan  ?  1.   CAD ?-Presented 10/21/21 with an NSTEMI, left main three-vessel disease. -CVTS was consulted and he underwent CABGx4 on 10/26/21.  Recent follow-up with Dr. Darcey Nora on 11/27/21 and was cleared to lift 10 to 15 pounds.

## 2021-12-12 ENCOUNTER — Other Ambulatory Visit: Payer: Self-pay

## 2021-12-12 ENCOUNTER — Encounter: Payer: Self-pay | Admitting: Nurse Practitioner

## 2021-12-12 ENCOUNTER — Ambulatory Visit (INDEPENDENT_AMBULATORY_CARE_PROVIDER_SITE_OTHER): Payer: 59 | Admitting: Nurse Practitioner

## 2021-12-12 VITALS — BP 92/60 | HR 60 | Ht 70.0 in | Wt 173.8 lb

## 2021-12-12 DIAGNOSIS — I2510 Atherosclerotic heart disease of native coronary artery without angina pectoris: Secondary | ICD-10-CM

## 2021-12-12 DIAGNOSIS — I480 Paroxysmal atrial fibrillation: Secondary | ICD-10-CM

## 2021-12-12 DIAGNOSIS — I255 Ischemic cardiomyopathy: Secondary | ICD-10-CM

## 2021-12-12 DIAGNOSIS — I10 Essential (primary) hypertension: Secondary | ICD-10-CM | POA: Diagnosis not present

## 2021-12-12 DIAGNOSIS — E785 Hyperlipidemia, unspecified: Secondary | ICD-10-CM | POA: Diagnosis not present

## 2021-12-12 DIAGNOSIS — Z951 Presence of aortocoronary bypass graft: Secondary | ICD-10-CM

## 2021-12-12 MED ORDER — FE FUMARATE-B12-VIT C-FA-IFC PO CAPS: 1.0000 | ORAL_CAPSULE | Freq: Two times a day (BID) | ORAL | 11 refills | Status: AC

## 2021-12-12 NOTE — Patient Instructions (Addendum)
Medication Instructions:  ?Your physician recommends that you continue on your current medications as directed. Please refer to the Current Medication list given to you today. ? ?*If you need a refill on your cardiac medications before your next appointment, please call your pharmacy* ? ? ?Lab Work: ?None ordered ? ?If you have labs (blood work) drawn today and your tests are completely normal, you will receive your results only by: ?MyChart Message (if you have MyChart) OR ?A paper copy in the mail ?If you have any lab test that is abnormal or we need to change your treatment, we will call you to review the results. ? ? ?Testing/Procedures: ?Your physician has requested that you have an echocardiogram after 01/23/22. Echocardiography is a painless test that uses sound waves to create images of your heart. It provides your doctor with information about the size and shape of your heart and how well your heart?s chambers and valves are working. This procedure takes approximately one hour. There are no restrictions for this procedure. ? ? ? ?Follow-Up: ?At [facility], you and your health needs are our priority.  As part of our continuing mission to provide you with exceptional heart care, we have created designated Provider Care Teams.  These Care Teams include your primary Cardiologist (physician) and Advanced Practice Providers (APPs -  Physician Assistants and Nurse Practitioners) who all work together to provide you with the care you need, when you need it. ? ?We recommend signing up for the patient portal called "MyChart".  Sign up information is provided on this After Visit Summary.  MyChart is used to connect with patients for Virtual Visits (Telemedicine).  Patients are able to view lab/test results, encounter notes, upcoming appointments, etc.  Non-urgent messages can be sent to your provider as well.   ?To learn more about what you can do with MyChart, go to [email or web address].   ? ?Your next  appointment:   ?After Echo ? ?The format for your next appointment:   ?In Person ? ?Provider:   ?Larae Grooms, MD or APP ? ? ?Other Instructions ?Monitor your blood pressure readings at home, call us in a week with those readings. [phone] ?

## 2022-01-22 ENCOUNTER — Ambulatory Visit (INDEPENDENT_AMBULATORY_CARE_PROVIDER_SITE_OTHER): Payer: Self-pay | Admitting: Cardiothoracic Surgery

## 2022-01-22 ENCOUNTER — Encounter: Payer: Self-pay | Admitting: Cardiothoracic Surgery

## 2022-01-22 VITALS — BP 113/76 | HR 102 | Resp 20 | Wt 175.0 lb

## 2022-01-22 DIAGNOSIS — I7090 Unspecified atherosclerosis: Secondary | ICD-10-CM

## 2022-01-22 DIAGNOSIS — Z951 Presence of aortocoronary bypass graft: Secondary | ICD-10-CM

## 2022-01-22 MED ORDER — CARVEDILOL 25 MG PO TABS: 12.5000 mg | ORAL_TABLET | Freq: Two times a day (BID) | ORAL | 0 refills | Status: DC

## 2022-01-22 NOTE — Progress Notes (Signed)
HPI ?Patient returns for his 26-monthpostop CABG evaluation.  The patient underwent CABG x4 in February for ischemic cardiomyopathy.  His postoperative course was complicated for readmission for upper GI bleeding from peptic ulcer disease.  This has been treated by GI and his last hemoglobin was improved to 9 g.  No more melanotic stools. ?He is walking well and denies any angina but he complains of lethargy shortly after taking his metoprolol. ?We will exchange his metoprolol for carvedilol which should help his LV dysfunction and hopefully reduce the side effects. ? ?Now that he is 3 months postop his sternal restrictions are lifted. ?Patient is still not strong enough to return to his full-time job due to his lethargy.  He has an echocardiogram scheduled with cardiology and will return here in 4 weeks to see how he feels after changing his beta-blocker. ? ?Current Outpatient Medications  ?Medication Sig Dispense Refill  ? Ascorbic Acid (VITAMIN C) 1000 MG tablet Take 1,000 mg by mouth daily.      ? aspirin EC 81 MG tablet Take 81 mg by mouth daily. Swallow whole.    ? carvedilol (COREG) 25 MG tablet Take 0.5 tablets (12.5 mg total) by mouth 2 (two) times daily with a meal. 60 tablet 0  ? cholecalciferol (VITAMIN D) 1000 UNITS tablet Take 1,000 Units by mouth daily.    ? CINNAMON PO Take 1 tablet by mouth daily.    ? ferrous [id number]-folic acid (TRINSICON / FOLTRIN) capsule Take 1 capsule by mouth 2 (two) times daily after a meal. 60 capsule 11  ? Garlic 1917MG TABS Take 1 tablet by mouth daily.    ? MAGNESIUM PO Take 1 tablet by mouth daily.    ? Menaquinone-7 (K2 PO) Take 1 tablet by mouth daily.    ? Omega-3 Fatty Acids (FISH OIL) 1000 MG CAPS Take 1 capsule by mouth daily.    ? polyethylene glycol (MIRALAX / GLYCOLAX) 17 g packet Take 17 g by mouth as needed for mild constipation.    ? ?No current facility-administered medications for this visit.  ? ? ? ?Review of Systems: ?Appetite slightly  improved, weight loss slightly improved now ?Mild swelling of right leg from saphenous vein harvest ? ?Physical Exam ?Blood pressure 113/76, pulse (!) 102, resp. rate 20, weight 175 lb (79.4 kg), SpO2 97 %.  ? ?  ?   Exam ? ?  General- alert and comfortable.  Sternal incision well-healed. ?   Neck- no JVD, no cervical adenopathy palpable, no carotid bruit ?  Lungs- clear without rales, wheezes ?  Cor- regular rate and rhythm, no murmur , gallop ?  Abdomen- soft, non-tender ?  Extremities - warm, non-tender, minimal edema ?  Neuro- oriented, appropriate, no focal weakness ? ?Diagnostic Tests: ?No x-rays today ? ?Impression: ?Stable 3 months postoperative multivessel CABG for ischemic cardiomyopathy.  His postop echo showed improvement in his EF.  He complains of lethargy related to medications, probably his metoprolol and this will be exchanged for carvedilol.  He can drive and increase his activity levels but is not ready to return to work ? ?Plan: Return in 4 weeks to assess return to work and assess his progress with his medication change. ? ? ? ?PDahlia Byes MD ?[facility] ?[phone] ? ? ? ? ?

## 2022-02-05 DIAGNOSIS — I10 Essential (primary) hypertension: Secondary | ICD-10-CM | POA: Insufficient documentation

## 2022-02-05 DIAGNOSIS — E785 Hyperlipidemia, unspecified: Secondary | ICD-10-CM | POA: Insufficient documentation

## 2022-02-05 DIAGNOSIS — I5020 Unspecified systolic (congestive) heart failure: Secondary | ICD-10-CM | POA: Insufficient documentation

## 2022-02-05 NOTE — Progress Notes (Unsigned)
Cardiology Office Note:    Date:  02/05/2022   ID:  Jorge Fisher, DOB [birth year], MRN [id number]  PCP:  Merryl Hacker, No  [facility] Providers Cardiologist:  Larae Grooms, MD { Click to update primary MD,subspecialty MD or APP then REFRESH:1}  *** Referring MD: No ref. provider found   Chief Complaint:  No chief complaint on file. {Click here for Visit Info    :1}   Patient Profile: Coronary artery disease  STEMI in 2013 s/p stenting to LAD and LCx NSTEMI Feb 2023 s/p CABG (L-LAD, S-Dx, S-OM, S-PDA) Post op AFib >> DC on Amiodarone >> DC'd 2/2 long QT Plavix DC'd in setting of profound anemia 2/2 UGI Bleed in 10/2021 HFmrEF (heart failure with mildly reduced ejection fraction)  Ischemic CM Echocardiogram 10/2021: EF 40-45 Hypertension Hyperlipidemia  UGI bleed in Feb 2023 EGD w non-bleeding duodenal ulcer, +Gastritis  Hgb 4.8 >> transfusion with PRBCs x 2   Prior CV Studies: LEFT HEART CATH AND CORONARY ANGIOGRAPHY 10/23/2021  Narrative Images from the original result were not included.    Prox RCA to Dist RCA lesion is 95% stenosed.   RPAV lesion is 100% stenosed.   RPDA lesion is 95% stenosed.   Ost LAD to Mid LAD lesion is 95% stenosed.   Ost LM lesion is 80% stenosed.   1st Diag lesion is 90% stenosed.   Mid Cx to Dist Cx lesion is 90% stenosed.   Ost Cx to Mid Cx lesion is 90% stenosed.  Jorge Fisher is a 66 y.o. male   [id number] LOCATION:  FACILITY: [facility] PHYSICIAN: Quay Burow, M.D. 01/16/1956   DATE OF PROCEDURE:  10/23/2021  DATE OF DISCHARGE:     CARDIAC CATHETERIZATION    History obtained from chart review.66 y.o. male with PMH Ischemic cardiomyopathy, h/o anterolateral STEMI 2013 s/p PCI LAD, staged PCI to LCX, CTO RCA (L->R collaterals), HTN, HLD  who is being seen 10/21/2021 for the evaluation of chest pain/NSTEMI.  His ejection fraction was in the 40 to 45% range.  Troponins peaked at 7000.  Impression [name] has left  main/three-vessel disease and moderate LV dysfunction.  He will need CABG for complete revascularization.  The radial sheath was removed and a TR band was placed on the right wrist to achieve patent hemostasis.  The patient left lab in stable condition.  Heparin will be restarted 4 hours after sheath removal without a bolus.  Quay Burow. MD, [facility] 10/23/2021 3:53 PM   ECHO COMPLETE WO IMAGING ENHANCING AGENT 10/21/2021  Narrative ECHOCARDIOGRAM REPORT    Patient Name:   Jorge Fisher Date of Exam: 10/21/2021 Medical Rec #:  [id number]      Height:       70.0 in Accession #:    [id number]     Weight:       172.1 lb Date of Birth:  [birth year]      BSA:          1.958 m Patient Age:    89 years       BP:           102/71 mmHg Patient Gender: M              HR:           62 bpm. Exam Location:  Inpatient  Procedure: 2D Echo, Cardiac Doppler and Color Doppler  Indications:    NSTEMI I21.4  History:        Patient has prior history of  Echocardiogram examinations, most recent 02/11/2012. Previous Myocardial Infarction and CAD.  Sonographer:    Merrie Roof RDCS Referring Phys: [id number] [place]   1. Left ventricular ejection fraction, by estimation, is 40 to 45%. The left ventricle has mildly decreased function. The left ventricle demonstrates regional wall motion abnormalities (see scoring diagram/findings for description). There is mild left ventricular hypertrophy. Left ventricular diastolic parameters are indeterminate. 2. Right ventricular systolic function is normal. The right ventricular size is normal. Tricuspid regurgitation signal is inadequate for assessing PA pressure. 3. The mitral valve is grossly normal. Trivial mitral valve regurgitation. 4. The aortic valve is tricuspid. Aortic valve regurgitation is not visualized. 5. The inferior vena cava is dilated in size with >50% respiratory variability, suggesting right atrial pressure of 8  mmHg.  Comparison(s): Prior images unable to be directly viewed.  FINDINGS Left Ventricle: Left ventricular ejection fraction, by estimation, is 40 to 45%. The left ventricle has mildly decreased function. The left ventricle demonstrates regional wall motion abnormalities. The left ventricular internal cavity size was normal in size. There is mild left ventricular hypertrophy. Left ventricular diastolic parameters are indeterminate.   LV Wall Scoring: The mid and distal anterior wall, entire anterior septum, apical lateral segment, and apex are hypokinetic. The antero-lateral wall, entire inferior wall, posterior wall, mid inferoseptal segment, basal anterior segment, and basal inferoseptal segment are normal.  Right Ventricle: The right ventricular size is normal. No increase in right ventricular wall thickness. Right ventricular systolic function is normal. Tricuspid regurgitation signal is inadequate for assessing PA pressure.  Left Atrium: Left atrial size was normal in size.  Right Atrium: Right atrial size was normal in size.  Pericardium: There is no evidence of pericardial effusion.  Mitral Valve: The mitral valve is grossly normal. Trivial mitral valve regurgitation.  Tricuspid Valve: The tricuspid valve is grossly normal. Tricuspid valve regurgitation is trivial.  Aortic Valve: The aortic valve is tricuspid. Aortic valve regurgitation is not visualized.  Pulmonic Valve: The pulmonic valve was not well visualized. Pulmonic valve regurgitation is trivial.  Aorta: The aortic root is normal in size and structure.  Venous: The inferior vena cava is dilated in size with greater than 50% respiratory variability, suggesting right atrial pressure of 8 mmHg.  IAS/Shunts: No atrial level shunt detected by color flow Doppler.   LEFT VENTRICLE PLAX 2D LVIDd:         5.00 cm   Diastology LVIDs:         3.80 cm   LV e' medial:    6.53 cm/s LV PW:         1.10 cm   LV E/e' medial:   10.5 LV IVS:        0.80 cm   LV e' lateral:   6.85 cm/s LVOT diam:     2.00 cm   LV E/e' lateral: 10.0 LVOT Area:     3.14 cm   RIGHT VENTRICLE          IVC RV Basal diam:  3.50 cm  IVC diam: 2.10 cm  LEFT ATRIUM             Index        RIGHT ATRIUM           Index LA diam:        2.60 cm 1.33 cm/m   RA Area:     16.40 cm LA Vol (A2C):   58.9 ml 30.08 ml/m  RA Volume:   44.00  ml  22.47 ml/m LA Vol (A4C):   41.4 ml 21.14 ml/m LA Biplane Vol: 50.2 ml 25.64 ml/m  AORTA Ao Root diam: 3.70 cm  MITRAL VALVE MV Area (PHT): 2.83 cm    SHUNTS MV Decel Time: 268 msec    Systemic Diam: 2.00 cm MV E velocity: 68.70 cm/s MV A velocity: 93.30 cm/s MV E/A ratio:  0.74  Jorge Lesches MD Electronically signed by Jorge Lesches MD Signature Date/Time: 10/21/2021/3:53:49 PM    Final   VAS US DOPPLER PRE CABG CAROTID AND ARMS (SINGLE) 10/24/2021  Narrative PREOPERATIVE VASCULAR EVALUATION  Patient Name:  Jorge Fisher  Date of Exam:   10/24/2021 Medical Rec #: [id number]       Accession #:    [id number] Date of Birth: 07-14-1956       Patient Gender: M Patient Age:   3 years Exam Location:  [facility] Procedure:      VAS US DOPPLER PRE CABG Referring Phys: Collier Salina VANTRIGT   --------------------------------------------------------------------------------  Indications:      Pre-CABG. Risk Factors:     Hypertension, prior MI, coronary artery disease. Other Factors:    Family history. Comparison Study: No prior studies.  Performing Technologist: Darlin Coco RDMS RVT   Examination Guidelines: A complete evaluation includes B-mode imaging, spectral Doppler, color Doppler, and power Doppler as needed of all accessible portions of each vessel. Bilateral testing is considered an integral part of a complete examination. Limited examinations for reoccurring indications may be performed as noted.   Right Carotid  Findings: +----------+--------+--------+--------+-----------------+------------------+           PSV cm/sEDV cm/sStenosisDescribe         Comments           +----------+--------+--------+--------+-----------------+------------------+ CCA Prox  100     15                                                  +----------+--------+--------+--------+-----------------+------------------+ CCA Distal76      15                               intimal thickening +----------+--------+--------+--------+-----------------+------------------+ ICA Prox  63      17              heterogenous mild                   +----------+--------+--------+--------+-----------------+------------------+ ICA Distal68      20                                                  +----------+--------+--------+--------+-----------------+------------------+ ECA       91      8                                                   +----------+--------+--------+--------+-----------------+------------------+  +----------+--------+-------+----------------+------------+           PSV cm/sEDV cmsDescribe        Arm Pressure +----------+--------+-------+----------------+------------+ [id number]  Multiphasic, WNL             +----------+--------+-------+----------------+------------+  +---------+--------+--+--------+--+---------+ VertebralPSV cm/s53EDV cm/s14Antegrade +---------+--------+--+--------+--+---------+  Left Carotid Findings: +----------+--------+--------+--------+-----------------+------------------+           PSV cm/sEDV cm/sStenosisDescribe         Comments           +----------+--------+--------+--------+-----------------+------------------+ CCA Prox  75      12                                                  +----------+--------+--------+--------+-----------------+------------------+ CCA Distal74      14                                intimal thickening +----------+--------+--------+--------+-----------------+------------------+ ICA Prox  92      24              heterogenous mild                   +----------+--------+--------+--------+-----------------+------------------+ ICA Distal74      22                                                  +----------+--------+--------+--------+-----------------+------------------+ ECA       83      6                                                   +----------+--------+--------+--------+-----------------+------------------+   +----------+--------+--------+----------------+------------+ SubclavianPSV cm/sEDV cm/sDescribe        Arm Pressure +----------+--------+--------+----------------+------------+           122             Multiphasic, WNL             +----------+--------+--------+----------------+------------+  +---------+--------+--+--------+--+---------+ VertebralPSV cm/s63EDV cm/s16Antegrade +---------+--------+--+--------+--+---------+   ABI Findings: +--------+------------------+-----+---------+--------+ Right   Rt Pressure (mmHg)IndexWaveform Comment  +--------+------------------+-----+---------+--------+ [id number]                    triphasic         +--------+------------------+-----+---------+--------+ PTA     122               1.09 triphasic         +--------+------------------+-----+---------+--------+ DP      117               1.04 triphasic         +--------+------------------+-----+---------+--------+  +--------+------------------+-----+---------+-------+ Left    Lt Pressure (mmHg)IndexWaveform Comment +--------+------------------+-----+---------+-------+ [id number]                    triphasic        +--------+------------------+-----+---------+-------+ PTA     137               1.22 triphasic        +--------+------------------+-----+---------+-------+ DP      100                0.89 biphasic         +--------+------------------+-----+---------+-------+  Right Doppler Findings: +--------+--------+-----+---------+--------+ Site    PressureIndexDoppler  Comments +--------+--------+-----+---------+--------+ [id number]          triphasic         +--------+--------+-----+---------+--------+ Radial               triphasic         +--------+--------+-----+---------+--------+ Ulnar                triphasic         +--------+--------+-----+---------+--------+    Left Doppler Findings: +--------+--------+-----+---------+--------+ Site    PressureIndexDoppler  Comments +--------+--------+-----+---------+--------+ [id number]          triphasic         +--------+--------+-----+---------+--------+ Radial               triphasic         +--------+--------+-----+---------+--------+ Ulnar                triphasic         +--------+--------+-----+---------+--------+    Summary: Right Carotid: The extracranial vessels were near-normal with only minimal wall thickening or plaque.  Left Carotid: The extracranial vessels were near-normal with only minimal wall thickening or plaque. Vertebrals:  Bilateral vertebral arteries demonstrate antegrade flow. Subclavians: Normal flow hemodynamics were seen in bilateral subclavian arteries.  Right ABI: Resting right ankle-brachial index is within normal range. No evidence of significant right lower extremity arterial disease. Left ABI: Resting left ankle-brachial index is within normal range. No evidence of significant left lower extremity arterial disease.  Right Upper Extremity: Doppler waveforms remain within normal limits with right radial compression. Doppler waveforms remain within normal limits with right ulnar compression. Left Upper Extremity: Doppler waveforms remain within normal limits with left radial compression. Doppler waveforms remain within  normal limits with left ulnar compression.     Electronically signed by Servando Snare MD on 10/24/2021 at 4:40:17 PM.    Final     ***  History of Present Illness:   Jorge Fisher is a 66 y.o. male with the above problem list.  He was last seen by Ambrose Pancoast, NP in March 2023.  Titration of GDMT has been limited by low BPs.  He has been kept off of Plavix due to presentation with UGI bleed due to duodenal ulcer in the setting of DAPT post NSTEMI followed by CABG.  He has a limited echocardiogram pending to follow up on EF.  He returns for f/u.  ***        Past Medical History:  Diagnosis Date   CAD (coronary artery disease)    a) diagnosed with acute anterior STEMI with totally occluded LAD s/p PTCA/DES x 2 to prox and mid LAD 02/08/12, staged PTCA/DES to Cx 02/12/12, diagonal dz for med rx, & chronically occ RCA with collaterals.    Colorectal polyps    Hemorrhoids    Hx of CABG 10/26/2021   Bypass x4   Hypertension    Mild HTN in past, but more recently borderline low blood pressures   Ischemic cardiomyopathy    Improved - EF 25-30% by cath 02/08/12 then 50-55% by echo 02/11/12   Current Medications: No outpatient medications have been marked as taking for the 02/06/22 encounter (Appointment) with Richardson Dopp T, PA-C.    Allergies:   Peanuts [peanut oil]   Social History   Tobacco Use   Smoking status: Never  Substance Use Topics   Alcohol use: No   Drug use: No    Family Hx: The patient's family  history includes Diabetes type II in his brother and sister; Heart attack (age of onset: 64) in his brother; Heart disease (age of onset: 20) in his father.  ROS   EKGs/Labs/Other Test Reviewed:    EKG:  EKG is *** ordered today.  The ekg ordered today demonstrates ***  Recent Labs: 10/27/2021: Magnesium 2.5 11/09/2021: ALT 18; B Natriuretic Peptide 75.7; TSH 1.155 11/13/2021: BUN 12; Creatinine, Ser 1.00; Hemoglobin 9.4; Platelets 217; Potassium 3.5; Sodium 138   Recent  Lipid Panel Recent Labs    10/21/21 0002  CHOL 168  TRIG 40  HDL 49  VLDL 8  LDLCALC 111*     Risk Assessment/Calculations:   {Does this patient have ATRIAL FIBRILLATION?[phone]}     Physical Exam:    VS:  There were no vitals taken for this visit.    Wt Readings from Last 3 Encounters:  01/22/22 175 lb (79.4 kg)  12/12/21 173 lb 12.8 oz (78.8 kg)  11/27/21 172 lb 9.6 oz (78.3 kg)    Physical Exam ***     ASSESSMENT & PLAN:   No problem-specific Assessment & Plan notes found for this encounter.        {Are you ordering a CV Procedure (e.g. stress test, cath, DCCV, TEE, etc)?   Press F2        :[id number]}  Dispo:  No follow-ups on file.   Medication Adjustments/Labs and Tests Ordered: Current medicines are reviewed at length with the patient today.  Concerns regarding medicines are outlined above.  Tests Ordered: No orders of the defined types were placed in this encounter.  Medication Changes: No orders of the defined types were placed in this encounter.  Signed, Richardson Dopp, PA-C  02/05/2022 10:39 PM    [facility] [address], [place], [place]  [address] Phone: [phone]; Fax: [phone]

## 2022-02-06 ENCOUNTER — Ambulatory Visit (HOSPITAL_COMMUNITY): Payer: 59 | Attending: Cardiology

## 2022-02-06 ENCOUNTER — Ambulatory Visit: Payer: 59 | Admitting: Physician Assistant

## 2022-02-06 DIAGNOSIS — I10 Essential (primary) hypertension: Secondary | ICD-10-CM

## 2022-02-06 DIAGNOSIS — I480 Paroxysmal atrial fibrillation: Secondary | ICD-10-CM

## 2022-02-06 DIAGNOSIS — E785 Hyperlipidemia, unspecified: Secondary | ICD-10-CM

## 2022-02-06 DIAGNOSIS — I5020 Unspecified systolic (congestive) heart failure: Secondary | ICD-10-CM

## 2022-02-06 DIAGNOSIS — I255 Ischemic cardiomyopathy: Secondary | ICD-10-CM

## 2022-02-06 DIAGNOSIS — Z8719 Personal history of other diseases of the digestive system: Secondary | ICD-10-CM

## 2022-02-06 LAB — ECHOCARDIOGRAM LIMITED
Area-P 1/2: 3.1 cm2
S' Lateral: 4.05 cm

## 2022-02-08 ENCOUNTER — Encounter: Payer: Self-pay | Admitting: Physician Assistant

## 2022-02-08 ENCOUNTER — Ambulatory Visit (INDEPENDENT_AMBULATORY_CARE_PROVIDER_SITE_OTHER): Payer: 59 | Admitting: Physician Assistant

## 2022-02-08 VITALS — BP 112/70 | HR 71 | Ht 70.0 in | Wt 172.6 lb

## 2022-02-08 DIAGNOSIS — I255 Ischemic cardiomyopathy: Secondary | ICD-10-CM

## 2022-02-08 DIAGNOSIS — E785 Hyperlipidemia, unspecified: Secondary | ICD-10-CM

## 2022-02-08 DIAGNOSIS — I25700 Atherosclerosis of coronary artery bypass graft(s), unspecified, with unstable angina pectoris: Secondary | ICD-10-CM | POA: Diagnosis not present

## 2022-02-08 MED ORDER — ATORVASTATIN CALCIUM 80 MG PO TABS: 80.0000 mg | ORAL_TABLET | Freq: Every day | ORAL | 2 refills | Status: DC

## 2022-02-08 MED ORDER — ENTRESTO 24-26 MG PO TABS: 1.0000 | ORAL_TABLET | Freq: Two times a day (BID) | ORAL | 0 refills | Status: DC

## 2022-02-08 NOTE — Patient Instructions (Signed)
Medication Instructions:  RESTART Atorvastatin '80mg'$  take 1 tablet once a day  START Entresto 24/'26mg'$  take 1 tablet once a day  *If you need a refill on your cardiac medications before your next appointment, please call your pharmacy*   Lab Work: None Ordered If you have labs (blood work) drawn today and your tests are completely normal, you will receive your results only by: [place] (if you have MyChart) OR A paper copy in the mail If you have any lab test that is abnormal or we need to change your treatment, we will call you to review the results.   Testing/Procedures: None Ordered   Follow-Up: At [facility], you and your health needs are our priority.  As part of our continuing mission to provide you with exceptional heart care, we have created designated Provider Care Teams.  These Care Teams include your primary Cardiologist (physician) and Advanced Practice Providers (APPs -  Physician Assistants and Nurse Practitioners) who all work together to provide you with the care you need, when you need it.  We recommend signing up for the patient portal called "MyChart".  Sign up information is provided on this After Visit Summary.  MyChart is used to connect with patients for Virtual Visits (Telemedicine).  Patients are able to view lab/test results, encounter notes, upcoming appointments, etc.  Non-urgent messages can be sent to your provider as well.   To learn more about what you can do with MyChart, go to [email or web address].    Your next appointment:   4 month(s)  The format for your next appointment:   In Person  Provider:   Larae Grooms, MD    SCHEDULE 2 WEEK APPT WITH APP OR PHARM D FOR TITRATION FOR MEDICATION MANAGEMENT   Other Instructions   Important Information About Sugar

## 2022-02-08 NOTE — Progress Notes (Signed)
Cardiology Office Note:    Date:  02/08/2022   ID:  Jorge Fisher, DOB [birth year], MRN [id number]  PCP:  Kathyrn Lass  [facility] Cardiologist:  Larae Grooms, MD  [place] Electrophysiologist:  None   Chief Complaint: 2 months follow up.   History of Present Illness:    Jorge Fisher is a 66 y.o. male with a hx of CAD, HTN, HLD and ICM presents for follow up.   Hx of CAD s/p STEMI 2013 with PCI to LAD, staged PCI to LCx, CTO RCA.   now CABG x 4 10/2021 2D echo 01/2012: EF 50-55% mild hypokinesis, wall motion abnormality, mild hypokinesis of apical wall 2D echo 10/2021: EF 40-45% mild LVH, trivial TV, MV regurg 2D echo 01/2022: LVEF 40-45%, regional WM abnormality.   Presented 10/21/21 with an NSTEMI,Echo completed with EF:40-45%,RWMA, and mild LVH. cath performed and revealed left main three-vessel disease. CVTS was consulted and he underwent CABG x4 on 4/0/97 without complication. He was readmitted to the ED via EMS on 2/23 with due syncope with bradycardia due to to bleeding peptic ulcer. Treated with crystalloids and blood transfusion. Plavix and amiodarone stopped due to long QTc and GI bleeding. Aspirin started.   Seen by Dr. Darcey Nora 01/22/22> having lethargy. Metoprolol changed to Coreg.   Patient is here for follow-up.  He reports that he never started Lipitor despite being listed taking on previous cardiology appointment.  Prior to his MI he was doing regular exercise but has not started yet.  He reports walking as much as his body tolerates.  No exertional chest pain or shortness of breath.  However, after walking he gets fatigued and tired.  He denies orthopnea, PND, syncope, lower extremity edema or melena.  Noted flat affect.  Suspect compliance could be issue.  10/21/2021: Cholesterol 168; HDL 49; LDL Cholesterol 111; Triglycerides 40; VLDL 8    Past Medical History:  Diagnosis Date   CAD (coronary artery disease)    a) diagnosed with acute anterior STEMI with totally  occluded LAD s/p PTCA/DES x 2 to prox and mid LAD 02/08/12, staged PTCA/DES to Cx 02/12/12, diagonal dz for med rx, & chronically occ RCA with collaterals.    Colorectal polyps    Hemorrhoids    Hx of CABG 10/26/2021   Bypass x4   Hypertension    Mild HTN in past, but more recently borderline low blood pressures   Ischemic cardiomyopathy    Improved - EF 25-30% by cath 02/08/12 then 50-55% by echo 02/11/12    Past Surgical History:  Procedure Laterality Date   BIOPSY  11/10/2021   Procedure: BIOPSY;  Surgeon: Carol Ada, MD;  Location: [place];  Service: Gastroenterology;;   CORONARY ARTERY BYPASS GRAFT N/A 10/26/2021   Procedure: CORONARY ARTERY BYPASS GRAFTING (CABG) X FOUR , USING LEFT INTERNAL MAMMARY ARTERY AND RIGHT LEG GREATER SAPHENOUS VEIN HARVESTED ENDOSCOPICALLY;  Surgeon: Dahlia Byes, MD;  Location: [place];  Service: Open Heart Surgery;  Laterality: N/A;   ENDOVEIN HARVEST OF GREATER SAPHENOUS VEIN Right 10/26/2021   Procedure: ENDOVEIN HARVEST OF GREATER SAPHENOUS VEIN;  Surgeon: Dahlia Byes, MD;  Location: [place];  Service: Open Heart Surgery;  Laterality: Right;   ESOPHAGOGASTRODUODENOSCOPY (EGD) WITH PROPOFOL N/A 11/10/2021   Procedure: ESOPHAGOGASTRODUODENOSCOPY (EGD) WITH PROPOFOL;  Surgeon: Carol Ada, MD;  Location: [place];  Service: Gastroenterology;  Laterality: N/A;   LEFT HEART CATH AND CORONARY ANGIOGRAPHY N/A 10/23/2021   Procedure: LEFT HEART CATH AND CORONARY ANGIOGRAPHY;  Surgeon:  Lorretta Harp, MD;  Location: [place] [facility];  Service: Cardiovascular;  Laterality: N/A;   LEFT HEART CATHETERIZATION WITH CORONARY ANGIOGRAM N/A 02/08/2012   Procedure: LEFT HEART CATHETERIZATION WITH CORONARY ANGIOGRAM;  Surgeon: Burnell Blanks, MD;  Location: [facility] C[facility] LAB;  Service: Cardiovascular;  Laterality: N/A;   PERCUTANEOUS CORONARY STENT INTERVENTION (PCI-S) N/A 02/08/2012   Procedure: PERCUTANEOUS CORONARY STENT INTERVENTION (PCI-S);  Surgeon:  Burnell Blanks, MD;  Location: [facility] CATH LAB;  Service: Cardiovascular;  Laterality: N/A;   PERCUTANEOUS CORONARY STENT INTERVENTION (PCI-S) N/A 02/12/2012   Procedure: PERCUTANEOUS CORONARY STENT INTERVENTION (PCI-S);  Surgeon: Sherren Mocha, MD;  Location: [facility] CATH LAB;  Service: Cardiovascular;  Laterality: N/A;   TEE WITHOUT CARDIOVERSION N/A 10/26/2021   Procedure: TRANSESOPHAGEAL ECHOCARDIOGRAM (TEE);  Surgeon: Dahlia Byes, MD;  Location: [place];  Service: Open Heart Surgery;  Laterality: N/A;    Current Medications: Current Meds  Medication Sig   Ascorbic Acid (VITAMIN C) 1000 MG tablet Take 1,000 mg by mouth daily.     aspirin EC 81 MG tablet Take 81 mg by mouth daily. Swallow whole.   atorvastatin (LIPITOR) 80 MG tablet Take 1 tablet (80 mg total) by mouth daily.   carvedilol (COREG) 25 MG tablet Take 0.5 tablets (12.5 mg total) by mouth 2 (two) times daily with a meal.   cholecalciferol (VITAMIN D) 1000 UNITS tablet Take 1,000 Units by mouth daily.   CINNAMON PO Take 1 tablet by mouth daily.   ferrous [id number] C-folic acid (TRINSICON / FOLTRIN) capsule Take 1 capsule by mouth 2 (two) times daily after a meal.   Garlic 846 MG TABS Take 1 tablet by mouth daily.   MAGNESIUM PO Take 1 tablet by mouth daily.   Menaquinone-7 (K2 PO) Take 1 tablet by mouth daily.   Omega-3 Fatty Acids (FISH OIL) 1000 MG CAPS Take 1 capsule by mouth daily.   polyethylene glycol (MIRALAX / GLYCOLAX) 17 g packet Take 17 g by mouth as needed for mild constipation.   sacubitril-valsartan (ENTRESTO) 24-26 MG Take 1 tablet by mouth 2 (two) times daily.     Allergies:   Peanuts [peanut oil]   Social History   Socioeconomic History   Marital status: Single    Spouse name: Not on file   Number of children: Not on file   Years of education: Not on file   Highest education level: Not on file  Occupational History   Not on file  Tobacco Use   Smoking status: Never   Smokeless tobacco:  Not on file  Substance and Sexual Activity   Alcohol use: No   Drug use: No   Sexual activity: Not on file  Other Topics Concern   Not on file  Social History Narrative   Not on file   Social Determinants of Health   Financial Resource Strain: Not on file  Food Insecurity: Not on file  Transportation Needs: Not on file  Physical Activity: Not on file  Stress: Not on file  Social Connections: Not on file     Family History: The patient's family history includes Diabetes type II in his brother and sister; Heart attack (age of onset: 56) in his brother; Heart disease (age of onset: 12) in his father.    ROS:   Please see the history of present illness.    All other systems reviewed and are negative.   EKGs/Labs/Other Studies Reviewed:    The following studies were reviewed today:  Echo 02/06/22  1. Limited study to assess LV function; full doppler not performed;  hypokinesis of the septum and apex with overall mild LV dysfunction.   2. Left ventricular ejection fraction, by estimation, is 40 to 45%. The  left ventricle has mildly decreased function. The left ventricle  demonstrates regional wall motion abnormalities (see scoring  diagram/findings for description). Left ventricular  diastolic parameters were normal.   3. Right ventricular systolic function is normal. The right ventricular  size is normal.   4. The mitral valve is normal in structure. Mild mitral valve  regurgitation. No evidence of mitral stenosis.   EKG:  EKG is not ordered today.    Recent Labs: 10/27/2021: Magnesium 2.5 11/09/2021: ALT 18; B Natriuretic Peptide 75.7; TSH 1.155 11/13/2021: BUN 12; Creatinine, Ser 1.00; Hemoglobin 9.4; Platelets 217; Potassium 3.5; Sodium 138  Recent Lipid Panel    Component Value Date/Time   CHOL 168 10/21/2021 0002   TRIG 40 10/21/2021 0002   HDL 49 10/21/2021 0002   CHOLHDL 3.4 10/21/2021 0002   VLDL 8 10/21/2021 0002   LDLCALC 111 (H) 10/21/2021 0002     Physical Exam:    VS:  BP 112/70   Pulse 71   Ht '5\' 10"'$  (1.778 m)   Wt 172 lb 9.6 oz (78.3 kg)   BMI 24.77 kg/m     Wt Readings from Last 3 Encounters:  02/08/22 172 lb 9.6 oz (78.3 kg)  01/22/22 175 lb (79.4 kg)  12/12/21 173 lb 12.8 oz (78.8 kg)     GEN:  Well nourished, well developed in no acute distress HEENT: Normal NECK: No JVD; No carotid bruits LYMPHATICS: No lymphadenopathy CARDIAC: RRR, no murmurs, rubs, gallops RESPIRATORY:  Clear to auscultation without rales, wheezing or rhonchi  ABDOMEN: Soft, non-tender, non-distended MUSCULOSKELETAL:  No edema; No deformity  SKIN: Warm and dry NEUROLOGIC:  Alert and oriented x 3 PSYCHIATRIC:  Normal affect   ASSESSMENT AND PLAN:    CAD s/p prior stenting and CABG x 4 11/2021 No exertional chest pain/pressure or dyspnea.  He has fatigue after walking.  He knows his limitation.  Continue aspirin and carvedilol.  Add statin.   2. ICM 2D echo 10/2021: EF 40-45% mild LVH, trivial TV, MV regurg 2D echo 01/2022: LVEF 40-45%, regional WM abnormality.  Euvolemic on exam.  Continue carvedilol 12.5 mg twice daily.  Add low-dose Entresto.  BMP in 2 weeks with follow-up for medication titration.  3. HLD -10/21/2021: Cholesterol 168; HDL 49; LDL Cholesterol 111; Triglycerides 40; VLDL 8  -Start Lipitor 80 mg daily -Lipid panel and LFTs in 8 weeks recommended  Medication Adjustments/Labs and Tests Ordered: Current medicines are reviewed at length with the patient today.  Concerns regarding medicines are outlined above.  No orders of the defined types were placed in this encounter.  Meds ordered this encounter  Medications   sacubitril-valsartan (ENTRESTO) 24-26 MG    Sig: Take 1 tablet by mouth 2 (two) times daily.    Dispense:  60 tablet    Refill:  0   atorvastatin (LIPITOR) 80 MG tablet    Sig: Take 1 tablet (80 mg total) by mouth daily.    Dispense:  30 tablet    Refill:  2    Patient Instructions  Medication  Instructions:  RESTART Atorvastatin '80mg'$  take 1 tablet once a day  START Entresto 24/'26mg'$  take 1 tablet once a day  *If you need a refill on your cardiac medications before your next appointment, please call your pharmacy*  Lab Work: None Ordered If you have labs (blood work) drawn today and your tests are completely normal, you will receive your results only by: [place] (if you have MyChart) OR A paper copy in the mail If you have any lab test that is abnormal or we need to change your treatment, we will call you to review the results.   Testing/Procedures: None Ordered   Follow-Up: At [facility], you and your health needs are our priority.  As part of our continuing mission to provide you with exceptional heart care, we have created designated Provider Care Teams.  These Care Teams include your primary Cardiologist (physician) and Advanced Practice Providers (APPs -  Physician Assistants and Nurse Practitioners) who all work together to provide you with the care you need, when you need it.  We recommend signing up for the patient portal called "MyChart".  Sign up information is provided on this After Visit Summary.  MyChart is used to connect with patients for Virtual Visits (Telemedicine).  Patients are able to view lab/test results, encounter notes, upcoming appointments, etc.  Non-urgent messages can be sent to your provider as well.   To learn more about what you can do with MyChart, go to [email or web address].    Your next appointment:   4 month(s)  The format for your next appointment:   In Person  Provider:   Larae Grooms, MD    SCHEDULE 2 WEEK APPT WITH APP OR PHARM D FOR TITRATION FOR MEDICATION MANAGEMENT   Other Instructions   Important Information About Sugar         Jarrett Soho, PA  02/08/2022 3:25 PM    [facility]

## 2022-02-09 ENCOUNTER — Telehealth: Payer: Self-pay

## 2022-02-09 NOTE — Telephone Encounter (Signed)
Spoke with patient and gave him the entresto ten dollar co-pay card information.  He voiced understanding and stated he would give the company a call.

## 2022-02-19 ENCOUNTER — Ambulatory Visit (INDEPENDENT_AMBULATORY_CARE_PROVIDER_SITE_OTHER): Payer: 59 | Admitting: Cardiothoracic Surgery

## 2022-02-19 ENCOUNTER — Encounter: Payer: Self-pay | Admitting: Cardiothoracic Surgery

## 2022-02-19 VITALS — BP 108/67 | HR 61 | Resp 20 | Ht 70.0 in | Wt 175.0 lb

## 2022-02-19 DIAGNOSIS — L905 Scar conditions and fibrosis of skin: Secondary | ICD-10-CM | POA: Diagnosis not present

## 2022-02-19 DIAGNOSIS — Z951 Presence of aortocoronary bypass graft: Secondary | ICD-10-CM | POA: Diagnosis not present

## 2022-02-19 DIAGNOSIS — Z9889 Other specified postprocedural states: Secondary | ICD-10-CM

## 2022-02-19 NOTE — Progress Notes (Signed)
Office Visit    Patient Name: Jorge Fisher Date of Encounter: 02/19/2022  Primary Care Provider:  Pcp, No Primary Cardiologist:  Larae Grooms, MD Primary Electrophysiologist: None  Chief Complaint    Jorge Fisher is a 66 y.o. male with PMH of male with PMH of CAD s/p STEMI 2013 with PCI to LAD, staged PCI to LCx, CTO RCA, CABG x 4, HTN, HLD presents today for 2-week follow-up for medication titration.  Past Medical History    Past Medical History:  Diagnosis Date   CAD (coronary artery disease)    a) diagnosed with acute anterior STEMI with totally occluded LAD s/p PTCA/DES x 2 to prox and mid LAD 02/08/12, staged PTCA/DES to Cx 02/12/12, diagonal dz for med rx, & chronically occ RCA with collaterals.    Colorectal polyps    Hemorrhoids    Hx of CABG 10/26/2021   Bypass x4   Hypertension    Mild HTN in past, but more recently borderline low blood pressures   Ischemic cardiomyopathy    Improved - EF 25-30% by cath 02/08/12 then 50-55% by echo 02/11/12   Past Surgical History:  Procedure Laterality Date   BIOPSY  11/10/2021   Procedure: BIOPSY;  Surgeon: Carol Ada, MD;  Location: [place];  Service: Gastroenterology;;   CORONARY ARTERY BYPASS GRAFT N/A 10/26/2021   Procedure: CORONARY ARTERY BYPASS GRAFTING (CABG) X FOUR , USING LEFT INTERNAL MAMMARY ARTERY AND RIGHT LEG GREATER SAPHENOUS VEIN HARVESTED ENDOSCOPICALLY;  Surgeon: Dahlia Byes, MD;  Location: [place];  Service: Open Heart Surgery;  Laterality: N/A;   ENDOVEIN HARVEST OF GREATER SAPHENOUS VEIN Right 10/26/2021   Procedure: ENDOVEIN HARVEST OF GREATER SAPHENOUS VEIN;  Surgeon: Dahlia Byes, MD;  Location: [place];  Service: Open Heart Surgery;  Laterality: Right;   ESOPHAGOGASTRODUODENOSCOPY (EGD) WITH PROPOFOL N/A 11/10/2021   Procedure: ESOPHAGOGASTRODUODENOSCOPY (EGD) WITH PROPOFOL;  Surgeon: Carol Ada, MD;  Location: [place];  Service: Gastroenterology;  Laterality: N/A;   LEFT HEART CATH AND  CORONARY ANGIOGRAPHY N/A 10/23/2021   Procedure: LEFT HEART CATH AND CORONARY ANGIOGRAPHY;  Surgeon: Lorretta Harp, MD;  Location: [place] [facility];  Service: Cardiovascular;  Laterality: N/A;   LEFT HEART CATHETERIZATION WITH CORONARY ANGIOGRAM N/A 02/08/2012   Procedure: LEFT HEART CATHETERIZATION WITH CORONARY ANGIOGRAM;  Surgeon: Burnell Blanks, MD;  Location: [facility] CATH LAB;  Service: Cardiovascular;  Laterality: N/A;   PERCUTANEOUS CORONARY STENT INTERVENTION (PCI-S) N/A 02/08/2012   Procedure: PERCUTANEOUS CORONARY STENT INTERVENTION (PCI-S);  Surgeon: Burnell Blanks, MD;  Location: [facility] CATH LAB;  Service: Cardiovascular;  Laterality: N/A;   PERCUTANEOUS CORONARY STENT INTERVENTION (PCI-S) N/A 02/12/2012   Procedure: PERCUTANEOUS CORONARY STENT INTERVENTION (PCI-S);  Surgeon: Sherren Mocha, MD;  Location: [facility] CATH LAB;  Service: Cardiovascular;  Laterality: N/A;   TEE WITHOUT CARDIOVERSION N/A 10/26/2021   Procedure: TRANSESOPHAGEAL ECHOCARDIOGRAM (TEE);  Surgeon: Dahlia Byes, MD;  Location: [place];  Service: Open Heart Surgery;  Laterality: N/A;    Allergies  Allergies  Allergen Reactions   Peanuts [Peanut Oil] Swelling    cashews    History of Present Illness    Jorge Fisher is a 66 year old male with above-mentioned past medical history who presents today for medication titration follow-up for Entresto.  Patient underwent CABG x4 on 10/26/2021 for ischemic cardiomyopathy.  Surgery was complicated by postoperative GI bleed from peptic ulcer that required readmission and clipping.  He was seen recently by Dr. Darcey Nora for 49-monthfollow-up and had no  complaints of angina but was still dealing with lethargy after taking metoprolol.  This was changed to carvedilol.  He was seen on 5/25 by Robbie Lis, PA for follow-up.  Patient was still endorsing fatigue following activity and was noted to have compliance issues with his Lipitor.  2D echo was completed on 5/23 with EF  of 40-45%, hypokinesis of the septum and apex with mild LV dysfunction.  Entresto was added to GDMT along with Lipitor.   Since last being seen in the office patient reports***.  Patient denies chest pain, palpitations, dyspnea, PND, orthopnea, nausea, vomiting, dizziness, syncope, edema, weight gain, or early satiety.     ***Notes: Lipitor compliance LFTs and lipids Fatigue and Entresto Blood pressure and titration up of Entresto.   Home Medications    Current Outpatient Medications  Medication Sig Dispense Refill   Ascorbic Acid (VITAMIN C) 1000 MG tablet Take 1,000 mg by mouth daily.       aspirin EC 81 MG tablet Take 81 mg by mouth daily. Swallow whole.     atorvastatin (LIPITOR) 80 MG tablet Take 1 tablet (80 mg total) by mouth daily. 30 tablet 2   carvedilol (COREG) 25 MG tablet Take 0.5 tablets (12.5 mg total) by mouth 2 (two) times daily with a meal. 60 tablet 0   cholecalciferol (VITAMIN D) 1000 UNITS tablet Take 1,000 Units by mouth daily.     CINNAMON PO Take 1 tablet by mouth daily.     ferrous [id number] C-folic acid (TRINSICON / FOLTRIN) capsule Take 1 capsule by mouth 2 (two) times daily after a meal. 60 capsule 11   Garlic 712 MG TABS Take 1 tablet by mouth daily.     MAGNESIUM PO Take 1 tablet by mouth daily.     Menaquinone-7 (K2 PO) Take 1 tablet by mouth daily.     Omega-3 Fatty Acids (FISH OIL) 1000 MG CAPS Take 1 capsule by mouth daily.     polyethylene glycol (MIRALAX / GLYCOLAX) 17 g packet Take 17 g by mouth as needed for mild constipation.     sacubitril-valsartan (ENTRESTO) 24-26 MG Take 1 tablet by mouth 2 (two) times daily. 60 tablet 0   No current facility-administered medications for this visit.     Review of Systems  Please see the history of present illness.    (+)*** (+)***  All other systems reviewed and are otherwise negative except as noted above.  Physical Exam    Wt Readings from Last 3 Encounters:  02/19/22 175 lb (79.4  kg)  02/08/22 172 lb 9.6 oz (78.3 kg)  01/22/22 175 lb (79.4 kg)   WP:YKDXI were no vitals filed for this visit.,There is no height or weight on file to calculate BMI.  Constitutional:      Appearance: Healthy appearance. Not in distress.  Neck:     Vascular: JVD normal.  Pulmonary:     Effort: Pulmonary effort is normal.     Breath sounds: No wheezing. No rales. Diminished in the bases Cardiovascular:     Normal rate. Regular rhythm. Normal S1. Normal S2.      Murmurs: There is no murmur.  Edema:    Peripheral edema absent.  Abdominal:     Palpations: Abdomen is soft non tender. There is no hepatomegaly.  Skin:    General: Skin is warm and dry.  Neurological:     General: No focal deficit present.     Mental Status: Alert and oriented to person, place and time.  Cranial Nerves: Cranial nerves are intact.  EKG/LABS/Other Studies Reviewed    ECG personally reviewed by me today - ***  Risk Assessment/Calculations:   {Does this patient have ATRIAL FIBRILLATION?:[phone]}        Lab Results  Component Value Date   WBC 8.3 11/13/2021   HGB 9.4 (L) 11/13/2021   HCT 28.1 (L) 11/13/2021   MCV 92.7 11/13/2021   PLT 217 11/13/2021   Lab Results  Component Value Date   CREATININE 1.00 11/13/2021   BUN 12 11/13/2021   NA 138 11/13/2021   K 3.5 11/13/2021   CL 107 11/13/2021   CO2 26 11/13/2021   Lab Results  Component Value Date   ALT 18 11/09/2021   AST 28 11/09/2021   ALKPHOS 44 11/09/2021   BILITOT 0.6 11/09/2021   Lab Results  Component Value Date   CHOL 168 10/21/2021   HDL 49 10/21/2021   LDLCALC 111 (H) 10/21/2021   TRIG 40 10/21/2021   CHOLHDL 3.4 10/21/2021    Lab Results  Component Value Date   HGBA1C 5.6 10/21/2021    Assessment & Plan    1.  HFpEF/ICM  2.  History of CABG:  3.  Hyperlipidemia  4.  Hypertension      Disposition: Follow-up with Larae Grooms, MD or APP in *** months {Are you ordering a CV Procedure (e.g.  stress test, cath, DCCV, TEE, etc)?   Press F2        :[id number]}   Medication Adjustments/Labs and Tests Ordered: Current medicines are reviewed at length with the patient today.  Concerns regarding medicines are outlined above.   Signed, Mable Fill, Marissa Nestle, NP 02/19/2022, 5:59 PM [place]

## 2022-02-19 NOTE — Progress Notes (Signed)
HPI:  Patient returns for follow-up after change in medication.  He is now almost 4 months after CABG for multivessel CAD with moderate LV dysfunction.  His symptoms of lethargy and weakness have improved after switching from Lopressor to carvedilol.  Patient is also on Entresto as directed by the cardiology service.  Patient denies any symptoms of angina or CHF.  He has been compliant with his current medications  Patient had echocardiogram about 2 weeks ago and his EF is 45%. Current Outpatient Medications  Medication Sig Dispense Refill   Ascorbic Acid (VITAMIN C) 1000 MG tablet Take 1,000 mg by mouth daily.       aspirin EC 81 MG tablet Take 81 mg by mouth daily. Swallow whole.     atorvastatin (LIPITOR) 80 MG tablet Take 1 tablet (80 mg total) by mouth daily. 30 tablet 2   carvedilol (COREG) 25 MG tablet Take 0.5 tablets (12.5 mg total) by mouth 2 (two) times daily with a meal. 60 tablet 0   cholecalciferol (VITAMIN D) 1000 UNITS tablet Take 1,000 Units by mouth daily.     CINNAMON PO Take 1 tablet by mouth daily.     ferrous [id number] C-folic acid (TRINSICON / FOLTRIN) capsule Take 1 capsule by mouth 2 (two) times daily after a meal. 60 capsule 11   Garlic 654 MG TABS Take 1 tablet by mouth daily.     MAGNESIUM PO Take 1 tablet by mouth daily.     Menaquinone-7 (K2 PO) Take 1 tablet by mouth daily.     Omega-3 Fatty Acids (FISH OIL) 1000 MG CAPS Take 1 capsule by mouth daily.     polyethylene glycol (MIRALAX / GLYCOLAX) 17 g packet Take 17 g by mouth as needed for mild constipation.     sacubitril-valsartan (ENTRESTO) 24-26 MG Take 1 tablet by mouth 2 (two) times daily. 60 tablet 0   No current facility-administered medications for this visit.     Physical Exam: Blood pressure 108/67, pulse 61, resp. rate 20, height '5\' 10"'$  (1.778 m), weight 175 lb (79.4 kg), SpO2 100 %.   Diagnostic Tests:   Impression: Patient now almost 4 months after CABG. He understands  importance of long-term compliance with heart healthy lifestyle and compliance with medications going forward.  He will return to this office as needed.   Marland Kitchen   Dahlia Byes, MD [facility] [phone]

## 2022-02-20 ENCOUNTER — Ambulatory Visit (INDEPENDENT_AMBULATORY_CARE_PROVIDER_SITE_OTHER): Payer: 59 | Admitting: Nurse Practitioner

## 2022-02-20 ENCOUNTER — Encounter: Payer: Self-pay | Admitting: Nurse Practitioner

## 2022-02-20 VITALS — BP 100/70 | HR 74 | Ht 70.0 in | Wt 175.8 lb

## 2022-02-20 DIAGNOSIS — E785 Hyperlipidemia, unspecified: Secondary | ICD-10-CM | POA: Diagnosis not present

## 2022-02-20 DIAGNOSIS — Z951 Presence of aortocoronary bypass graft: Secondary | ICD-10-CM

## 2022-02-20 DIAGNOSIS — I10 Essential (primary) hypertension: Secondary | ICD-10-CM | POA: Diagnosis not present

## 2022-02-20 DIAGNOSIS — I25700 Atherosclerosis of coronary artery bypass graft(s), unspecified, with unstable angina pectoris: Secondary | ICD-10-CM | POA: Diagnosis not present

## 2022-02-20 DIAGNOSIS — I5020 Unspecified systolic (congestive) heart failure: Secondary | ICD-10-CM | POA: Diagnosis not present

## 2022-02-20 DIAGNOSIS — I255 Ischemic cardiomyopathy: Secondary | ICD-10-CM

## 2022-02-20 NOTE — Patient Instructions (Addendum)
Medication Instructions:  Your physician recommends that you continue on your current medications as directed. Please refer to the Current Medication list given to you today.  *If you need a refill on your cardiac medications before your next appointment, please call your pharmacy*   Lab Work: 2 WEEKS:  BMET  6 WEEKS:  FASTING LIPID & LFT  If you have labs (blood work) drawn today and your tests are completely normal, you will receive your results only by: [place] (if you have MyChart) OR A paper copy in the mail If you have any lab test that is abnormal or we need to change your treatment, we will call you to review the results.   Testing/Procedures: None ordered   Follow-Up: At [facility], you and your health needs are our priority.  As part of our continuing mission to provide you with exceptional heart care, we have created designated Provider Care Teams.  These Care Teams include your primary Cardiologist (physician) and Advanced Practice Providers (APPs -  Physician Assistants and Nurse Practitioners) who all work together to provide you with the care you need, when you need it.  We recommend signing up for the patient portal called "MyChart".  Sign up information is provided on this After Visit Summary.  MyChart is used to connect with patients for Virtual Visits (Telemedicine).  Patients are able to view lab/test results, encounter notes, upcoming appointments, etc.  Non-urgent messages can be sent to your provider as well.   To learn more about what you can do with MyChart, go to [email or web address].    Your next appointment:   1 month(s)  The format for your next appointment:   In Person  Provider:   Pharm D for medication titration     Other Instructions   Important Information About Sugar

## 2022-03-15 NOTE — Progress Notes (Signed)
RTW note in chart

## 2022-03-22 ENCOUNTER — Ambulatory Visit (INDEPENDENT_AMBULATORY_CARE_PROVIDER_SITE_OTHER): Payer: 59 | Admitting: Pharmacist

## 2022-03-22 VITALS — BP 102/62 | HR 61 | Wt 173.0 lb

## 2022-03-22 DIAGNOSIS — E782 Mixed hyperlipidemia: Secondary | ICD-10-CM

## 2022-03-22 DIAGNOSIS — I5020 Unspecified systolic (congestive) heart failure: Secondary | ICD-10-CM | POA: Diagnosis not present

## 2022-03-22 DIAGNOSIS — I2510 Atherosclerotic heart disease of native coronary artery without angina pectoris: Secondary | ICD-10-CM

## 2022-03-22 MED ORDER — ENTRESTO 24-26 MG PO TABS: 1.0000 | ORAL_TABLET | Freq: Two times a day (BID) | ORAL | 1 refills | Status: DC

## 2022-03-22 MED ORDER — ATORVASTATIN CALCIUM 80 MG PO TABS: 80.0000 mg | ORAL_TABLET | Freq: Every day | ORAL | 3 refills | Status: DC

## 2022-03-22 MED ORDER — CARVEDILOL 12.5 MG PO TABS: 12.5000 mg | ORAL_TABLET | Freq: Two times a day (BID) | ORAL | 3 refills | Status: DC

## 2022-03-22 NOTE — Progress Notes (Signed)
Patient ID: Jorge Fisher                 DOB: 29-Apr-1956                      MRN: [id number]     HPI: Jorge Fisher is a 66 y.o. male patient of Dr Irish Lack referred by Ambrose Pancoast, NP to pharmacy clinic for HF medication management. PMH is significant for CAD s/p STEMI 2013 with PCI to LAD, staged PCI to LCx, CTO RCA, presented 10/21/21 with NSTEMI and underwent CABG x4, HTN, HLD. Most recent LVEF 40-45% on 02/06/22 echo. Last seen on 02/20/22, pt had recently started Entresto on 02/17/22 and restarted his atorvastatin then as well. BP was 100/70 and pt was referred to PharmD for follow up med management.  Pt presents today for follow up. Reports feeling well overall. Denies headaches, LE edema, and SOB. Occasional dizziness over the past week but typically occurs when he's laying down on his left side. Does not check BP at home since his BP has always been well controlled. Activated copay cards online for his [employer]oll.  Current CHF meds: carvedilol 12.'5mg'$  BID, Entresto 24-'26mg'$  BID (9am, 9pm) Previously tried: metoprolol - lethargy  BP goal: <130/44mHg  Family History: Diabetes type II in his brother and sister; Heart attack (age of onset: 516 in his brother; Heart disease (age of onset: 628 in his father.    Social History: Denies tobacco, alcohol and drug use.  Wt Readings from Last 3 Encounters:  02/20/22 175 lb 12.8 oz (79.7 kg)  02/19/22 175 lb (79.4 kg)  02/08/22 172 lb 9.6 oz (78.3 kg)   BP Readings from Last 3 Encounters:  02/20/22 100/70  02/19/22 108/67  02/08/22 112/70   Pulse Readings from Last 3 Encounters:  02/20/22 74  02/19/22 61  02/08/22 71    Renal function: CrCl cannot be calculated (Patient's most recent lab result is older than the maximum 21 days allowed.).  Past Medical History:  Diagnosis Date   CAD (coronary artery disease)    a) diagnosed with acute anterior STEMI with totally occluded LAD s/p PTCA/DES x 2 to prox and mid LAD 02/08/12, staged PTCA/DES to  Cx 02/12/12, diagonal dz for med rx, & chronically occ RCA with collaterals.    Colorectal polyps    Hemorrhoids    Hx of CABG 10/26/2021   Bypass x4   Hypertension    Mild HTN in past, but more recently borderline low blood pressures   Ischemic cardiomyopathy    Improved - EF 25-30% by cath 02/08/12 then 50-55% by echo 02/11/12    Current Outpatient Medications on File Prior to Visit  Medication Sig Dispense Refill   Ascorbic Acid (VITAMIN C) 1000 MG tablet Take 1,000 mg by mouth daily.       aspirin EC 81 MG tablet Take 81 mg by mouth daily. Swallow whole.     atorvastatin (LIPITOR) 80 MG tablet Take 1 tablet (80 mg total) by mouth daily. 30 tablet 2   carvedilol (COREG) 25 MG tablet Take 0.5 tablets (12.5 mg total) by mouth 2 (two) times daily with a meal. 60 tablet 0   cholecalciferol (VITAMIN D) 1000 UNITS tablet Take 1,000 Units by mouth daily.     CINNAMON PO Take 1 tablet by mouth daily.     ferrous [id number]-folic acid (TRINSICON / FOLTRIN) capsule Take 1 capsule by mouth 2 (two) times daily after a meal. 60 capsule  11   Garlic 585 MG TABS Take 1 tablet by mouth daily.     MAGNESIUM PO Take 1 tablet by mouth daily.     Menaquinone-7 (K2 PO) Take 1 tablet by mouth daily.     Omega-3 Fatty Acids (FISH OIL) 1000 MG CAPS Take 1 capsule by mouth daily.     polyethylene glycol (MIRALAX / GLYCOLAX) 17 g packet Take 17 g by mouth as needed for mild constipation.     sacubitril-valsartan (ENTRESTO) 24-26 MG Take 1 tablet by mouth 2 (two) times daily. 60 tablet 0   No current facility-administered medications on file prior to visit.    Allergies  Allergen Reactions   Peanuts [Peanut Oil] Swelling    cashews     Assessment/Plan:  1. HFmrEF - BP soft at 102/62 on carvedilol 12.'5mg'$  BID and Entresto 24-'26mg'$  BID. Checking BMET today with recent addition of Entresto. Unable to titrate dose due to BP, HR also stable at 61. Sent in refills for all cardiac meds today as 3 month  supply to help with adherence. Pt already activated Entresto copay card. Refilled carvedilol as 12.'5mg'$  BID instead of '25mg'$  1/2 tab BID for easier dosing. Pt will keep f/u appt with Dr Irish Lack in Sept. If BP allows, recommend addition of SGLT2i at that time.  2. Hyperlipidemia - LDL goal < 55 due to premature and progressive ASCVD. Checking lipids, LFTs, and direct LDL (pt not fasting) today since he has resumed atorvastatin '80mg'$  daily.  F/u with PharmD as needed.  Anila Bojarski E. Shruthi Northrup, PharmD, BCACP, [place] [address] [place], [place] [address] Phone: [phone]; Fax: [phone] 03/22/2022 10:35 AM

## 2022-03-22 NOTE — Patient Instructions (Addendum)
It was nice to see you today!  Continue taking your current medications  Your blood pressure goal is < 130/41mHg Your LDL cholesterol goal is < 55

## 2022-03-26 ENCOUNTER — Telehealth: Payer: Self-pay

## 2022-03-26 LAB — LIPID PANEL
Chol/HDL Ratio: 2.3 ratio (ref 0.0–5.0)
Cholesterol, Total: 108 mg/dL (ref 100–199)
HDL: 47 mg/dL (ref >39)
LDL Chol Calc (NIH): 49 mg/dL (ref 0–99)
Triglycerides: 51 mg/dL (ref 0–149)
VLDL Cholesterol Cal: 12 mg/dL (ref 5–40)

## 2022-03-26 LAB — COMPREHENSIVE METABOLIC PANEL
ALT: 23 IU/L (ref 0–44)
AST: 25 IU/L (ref 0–40)
Albumin/Globulin Ratio: 1.4 (ref 1.2–2.2)
Albumin: 4 g/dL (ref 3.9–4.9)
Alkaline Phosphatase: 83 IU/L (ref 44–121)
BUN/Creatinine Ratio: 10 (ref 10–24)
BUN: 12 mg/dL (ref 8–27)
Bilirubin Total: 0.7 mg/dL (ref 0.0–1.2)
CO2: 26 mmol/L (ref 20–29)
Calcium: 9.6 mg/dL (ref 8.6–10.2)
Chloride: 104 mmol/L (ref 96–106)
Creatinine, Ser: 1.16 mg/dL (ref 0.76–1.27)
Globulin, Total: 2.9 g/dL (ref 1.5–4.5)
Glucose: 101 mg/dL — ABNORMAL HIGH (ref 70–99)
Potassium: 4 mmol/L (ref 3.5–5.2)
Sodium: 141 mmol/L (ref 134–144)
Total Protein: 6.9 g/dL (ref 6.0–8.5)

## 2022-03-26 LAB — LDL CHOLESTEROL, DIRECT: LDL Direct: 56 mg/dL (ref 0–99)

## 2022-03-26 NOTE — Telephone Encounter (Signed)
**Note De-Identified Jorge Fisher Obfuscation** Entresto PA started through covermymeds. Key: [id number]

## 2022-04-03 ENCOUNTER — Other Ambulatory Visit: Payer: 59

## 2022-06-01 ENCOUNTER — Ambulatory Visit: Payer: 59 | Admitting: Interventional Cardiology

## 2022-12-07 ENCOUNTER — Other Ambulatory Visit: Payer: Self-pay | Admitting: Nurse Practitioner

## 2023-01-10 ENCOUNTER — Other Ambulatory Visit: Payer: Self-pay | Admitting: Interventional Cardiology

## 2023-02-05 IMAGING — DX DG CHEST 2V
2 series · 2 of 2 positions shown · non-contrast
Comparison: 02/08/2012

CLINICAL DATA: Chest pain

EXAM:
CHEST - 2 VIEW

[chest pa]
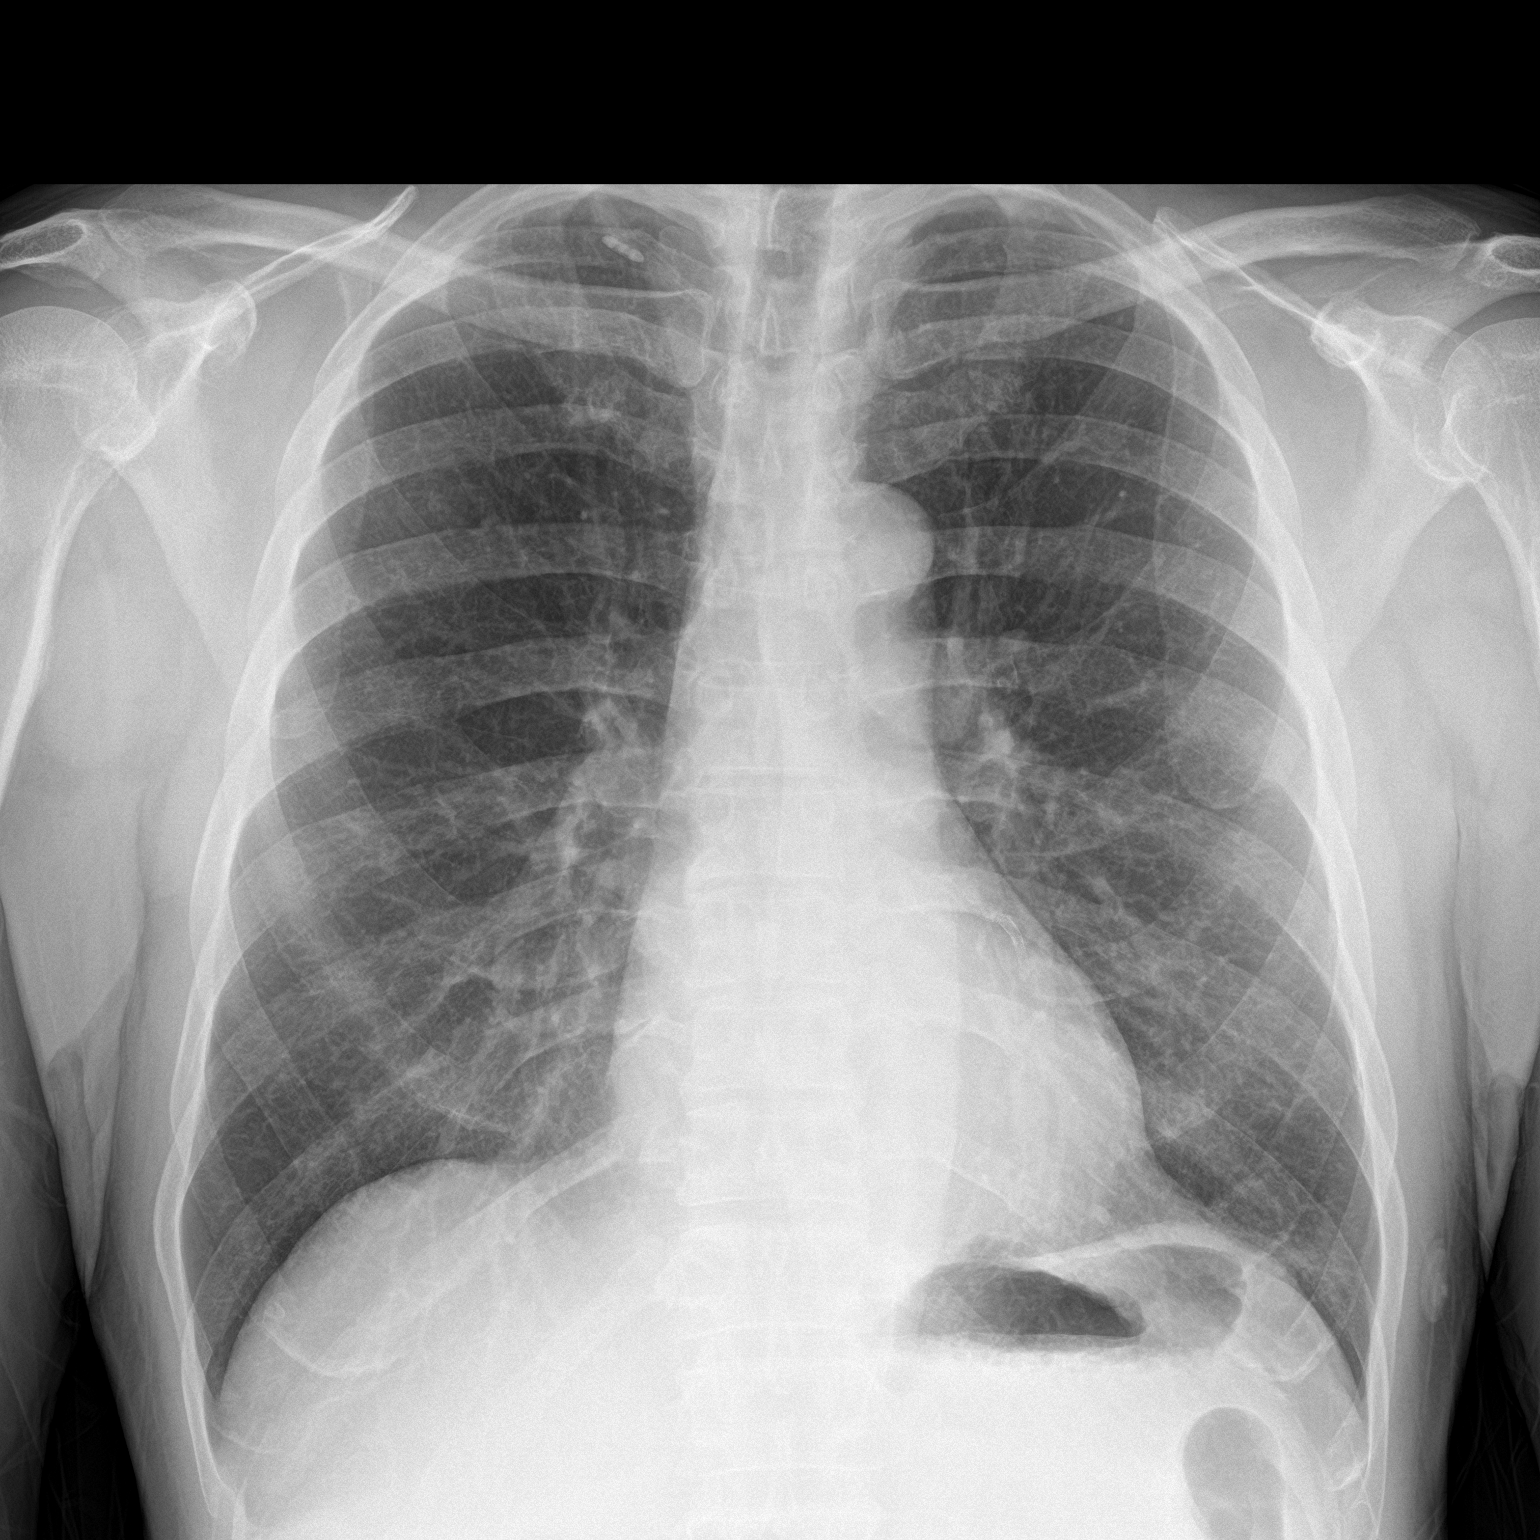

[chest lat]
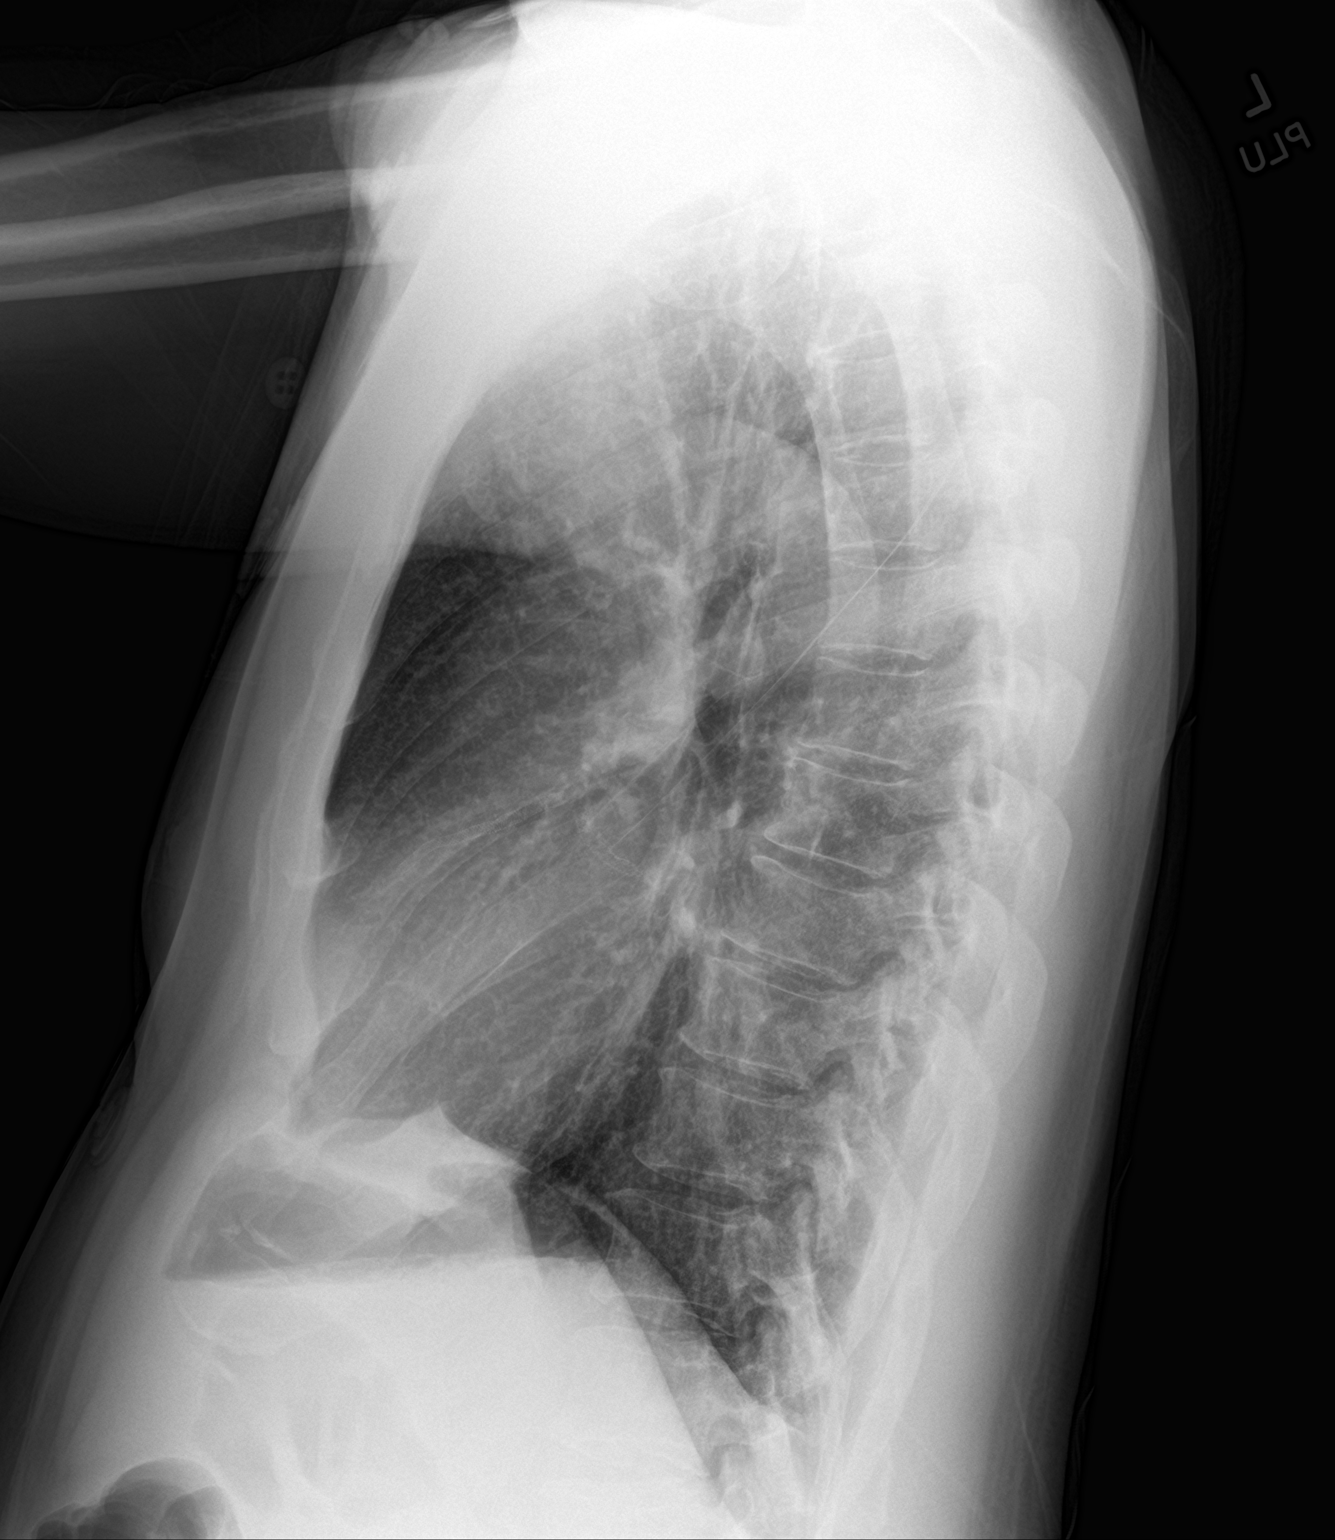

[2 of 2 positions shown; findings below may reference images not displayed]

FINDINGS: The heart size and mediastinal contours are within normal limits.
Both lungs are clear. The visualized skeletal structures are
unremarkable.
IMPRESSION: No active cardiopulmonary disease.

## 2023-02-10 IMAGING — CR DG CHEST 1V PORT
1 series · 1 of 1 positions shown · non-contrast
Comparison: 10/21/2021

CLINICAL DATA: Postoperative CABG

EXAM:
PORTABLE CHEST 1 VIEW

[AP]
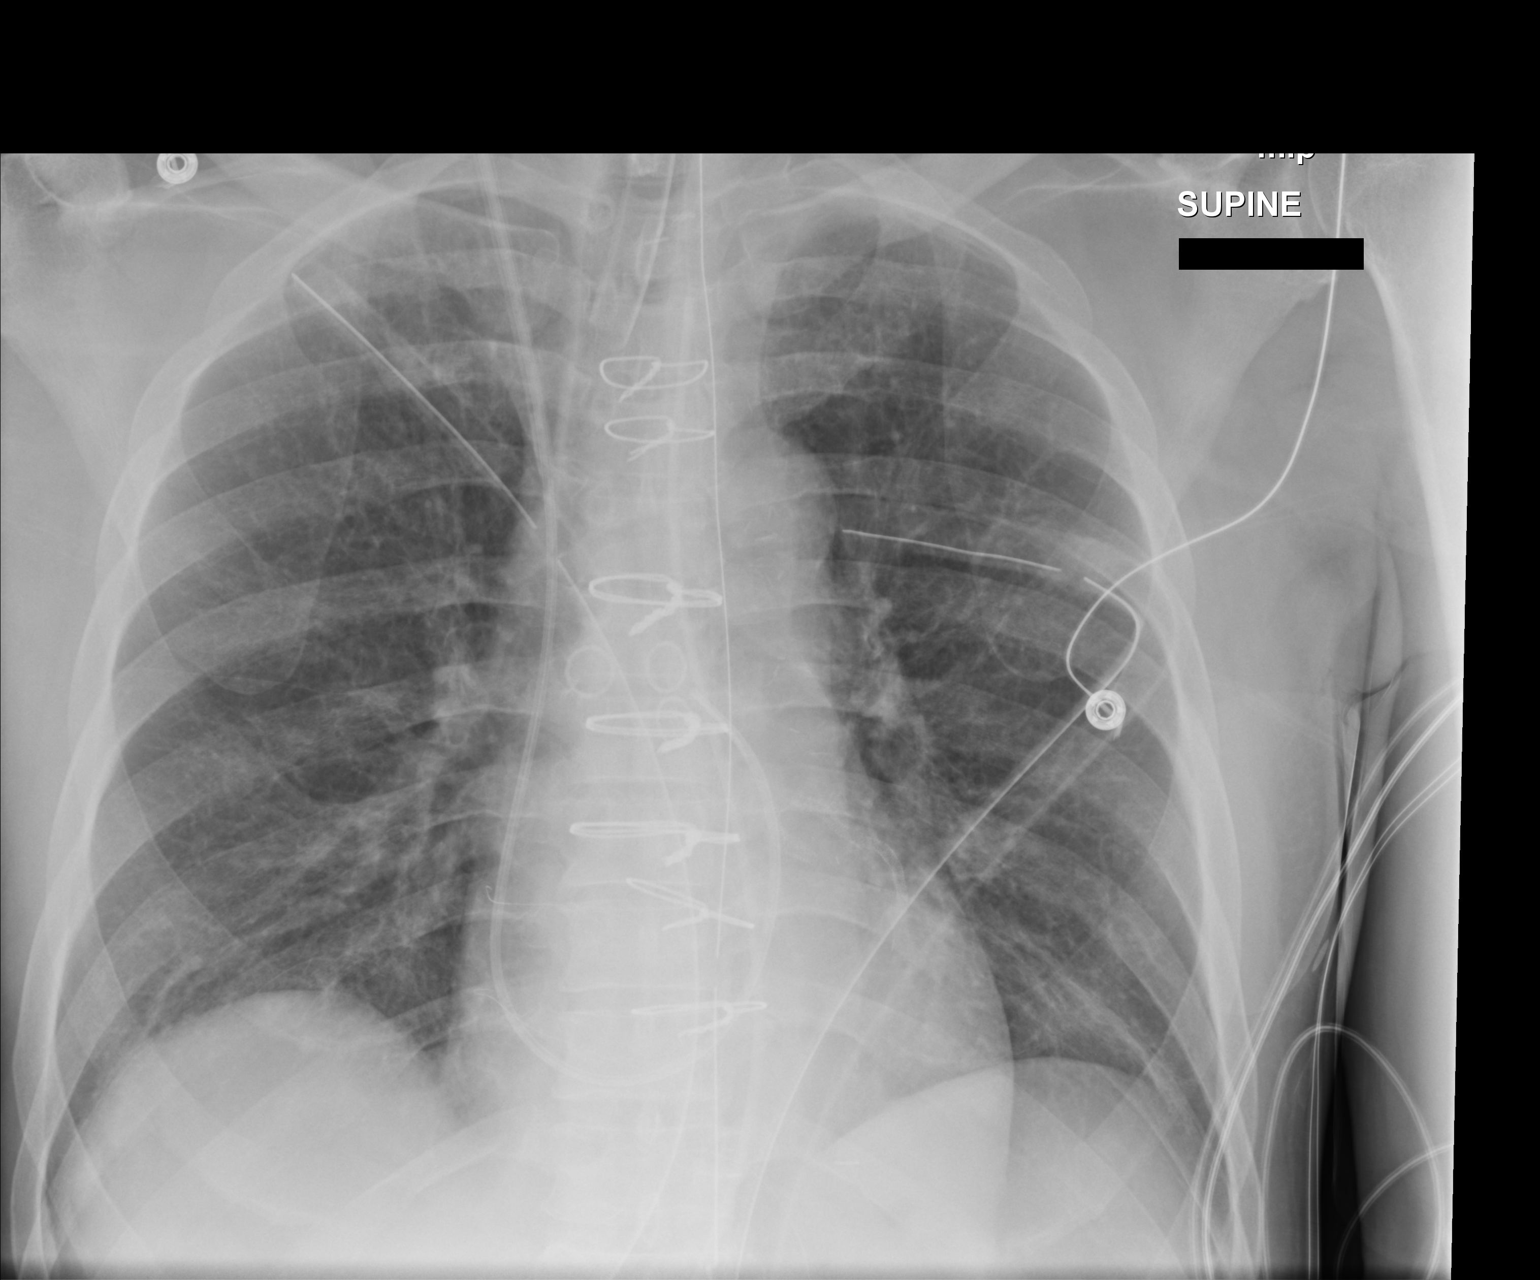

[1 of 1 positions shown; findings below may reference images not displayed]

FINDINGS: Interval postoperative changes of the chest status post CABG.
Endotracheal tube terminates approximately 6.0 cm above the carina.
Enteric tube terminates at the level of the GE junction with side
port in the distal esophagus. Right IJ Swan-Ganz catheter terminates
at the level of the proximal pulmonary outflow. Bilateral chest
tubes and mediastinal drain in place.

Heart size is normal.  The lungs are clear.  No pneumothorax.
IMPRESSION: 1. No acute cardiopulmonary findings status post CABG.
2. Enteric tube terminates at the level of the GE junction with side
port in the distal esophagus. Recommend advancement approximately 10
cm.
3. Otherwise, appropriately positioned support lines and tubes.

## 2023-02-11 IMAGING — DX DG CHEST 1V PORT
1 series · 1 of 1 positions shown · non-contrast
Comparison: October 26, 2021

CLINICAL DATA: Chest tube status post CABG x4

EXAM:
PORTABLE CHEST 1 VIEW

[chest ap]
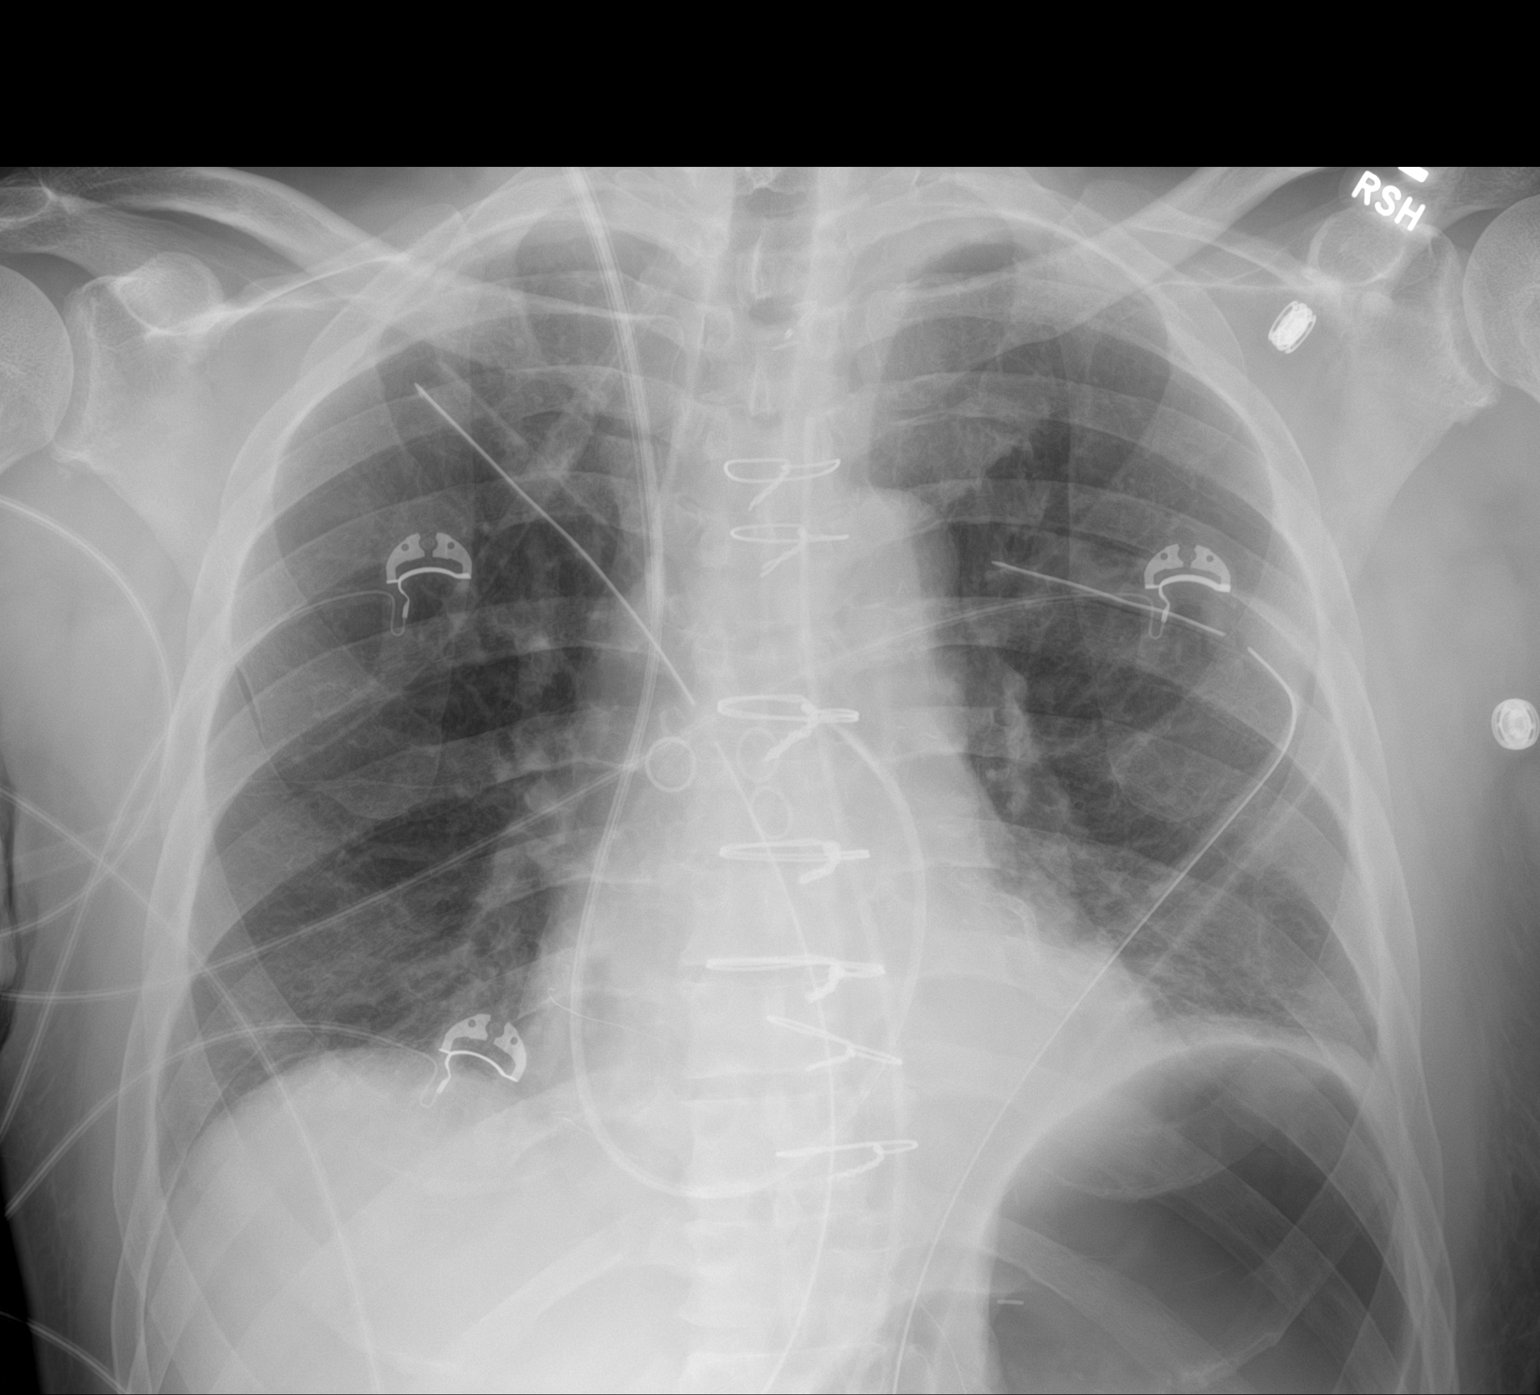

[1 of 1 positions shown; findings below may reference images not displayed]

FINDINGS: Right IJ approach Swan-Ganz catheter with tip overlying the main
pulmonary artery. Interval removal of the endotracheal and enteric
tubes.

Similar postoperative changes of median sternotomy prior and CABG.
Bilateral chest tubes and mediastinal drain are in place.

The heart size and mediastinal contours are unchanged. Low lung
volumes with bibasilar opacities likely atelectasis. No visible
pleural effusion or pneumothorax.

The visualized skeletal structures are unchanged.
IMPRESSION: 1. Low lung volumes with bibasilar opacities likely atelectasis.
2. Support lines and tubes as above.
3. No visible pleural effusion or pneumothorax.

## 2023-02-12 IMAGING — DX DG CHEST 1V PORT
1 series · 1 of 1 positions shown · non-contrast
Comparison: 10/27/2021

CLINICAL DATA: NSTEMI

Chest pain
EXAM:
PORTABLE CHEST 1 VIEW

[chest ap]
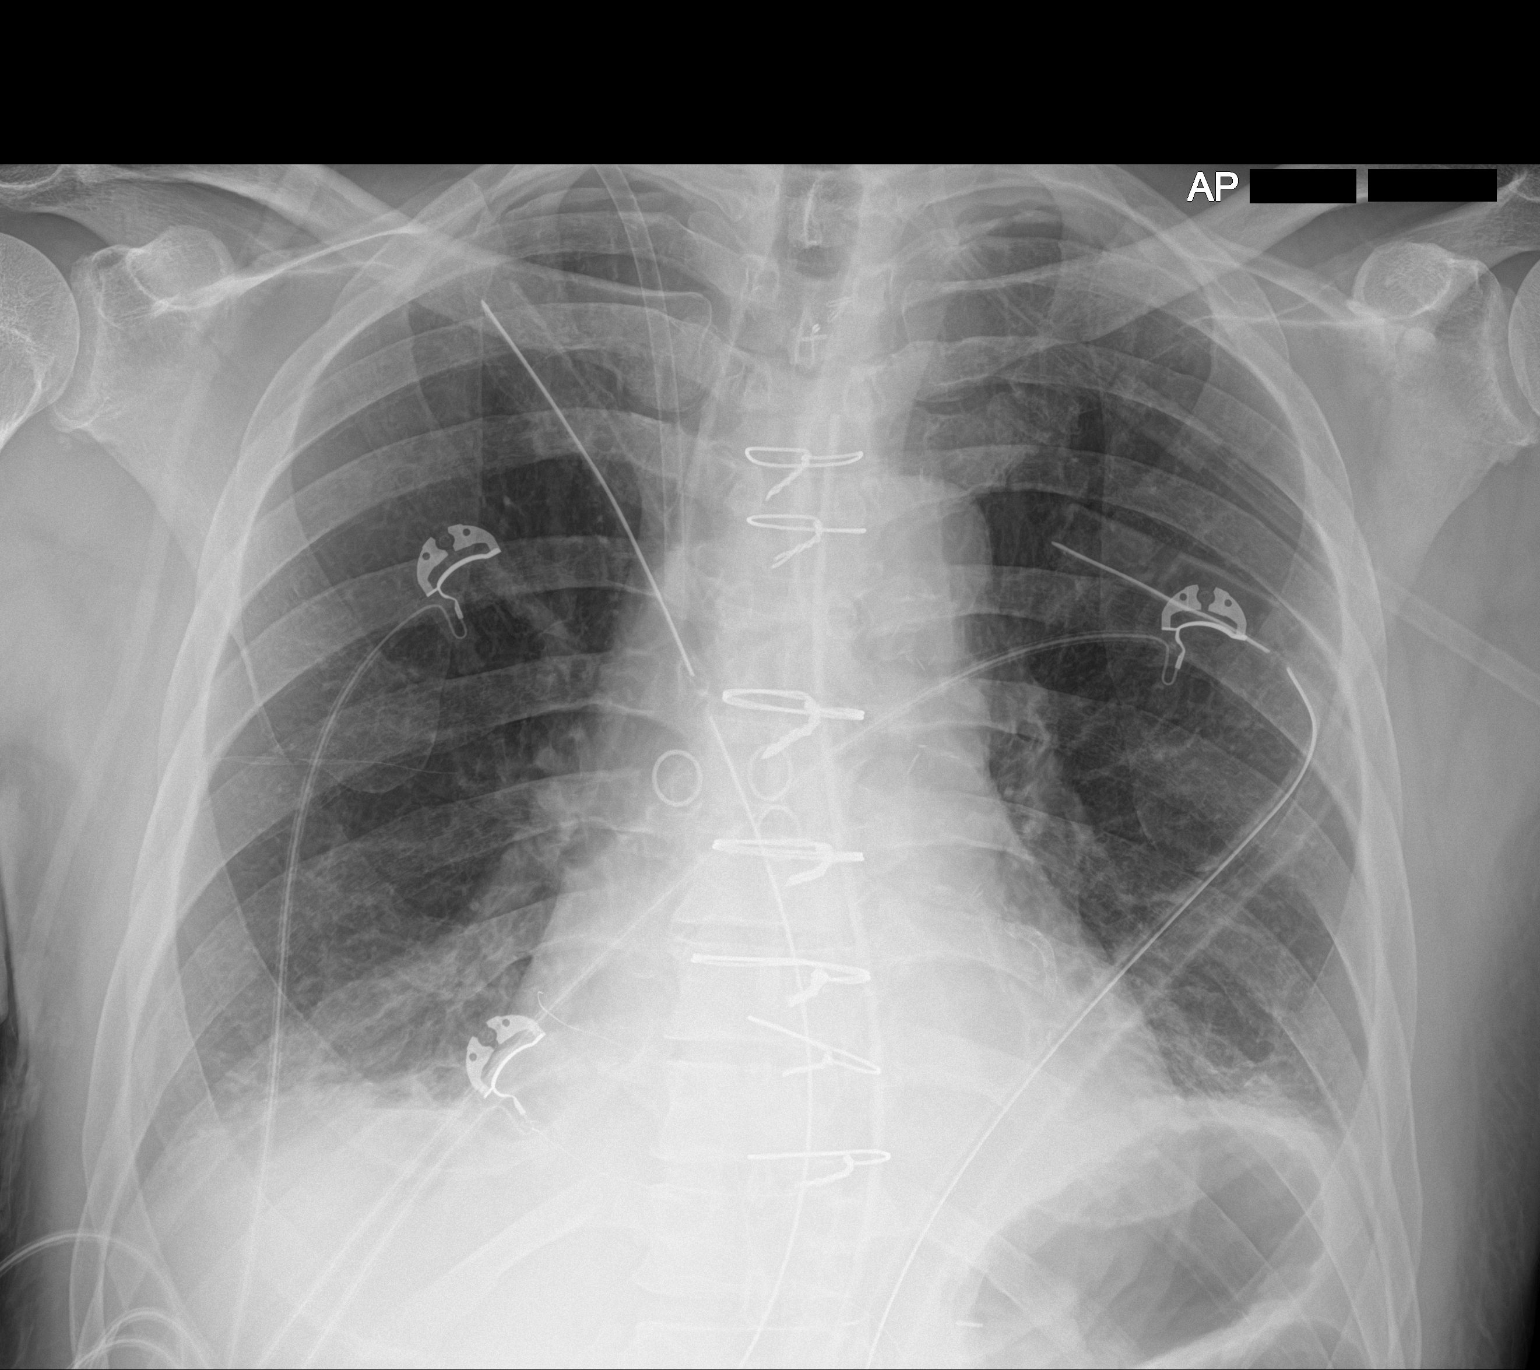

[1 of 1 positions shown; findings below may reference images not displayed]

FINDINGS: Heart size is within normal limits. No pulmonary vascular
congestion. Mild bibasilar atelectasis. Minimal right apical
pneumothorax is new since prior examination.

Bilateral chest tubes are unchanged in position. Swan-Ganz catheter
has been removed, but the right IJ sheath remains in place.
Mediastinal drain is again noted. Postsurgical changes of CABG are
seen.
IMPRESSION: Minimal right apical pneumothorax, new compared to prior study.

These results will be called to the ordering clinician or
representative by the Radiologist Assistant, and communication
documented in the PACS or [REDACTED].

## 2023-03-25 ENCOUNTER — Other Ambulatory Visit: Payer: Self-pay | Admitting: Interventional Cardiology
